# Patient Record
Sex: Male | Born: 1950 | Race: White | Hispanic: No | Marital: Single | Smoking: Never smoker
Health system: Southern US, Community
[De-identification: ages and names within clinical notes are randomized; demographics above are authoritative.]

## PROBLEM LIST (undated history)

## (undated) DIAGNOSIS — E119 Type 2 diabetes mellitus without complications: Secondary | ICD-10-CM

## (undated) DIAGNOSIS — J45909 Unspecified asthma, uncomplicated: Secondary | ICD-10-CM

## (undated) DIAGNOSIS — Z87442 Personal history of urinary calculi: Secondary | ICD-10-CM

## (undated) DIAGNOSIS — K219 Gastro-esophageal reflux disease without esophagitis: Secondary | ICD-10-CM

## (undated) DIAGNOSIS — K759 Inflammatory liver disease, unspecified: Secondary | ICD-10-CM

## (undated) DIAGNOSIS — M1990 Unspecified osteoarthritis, unspecified site: Secondary | ICD-10-CM

## (undated) DIAGNOSIS — M109 Gout, unspecified: Secondary | ICD-10-CM

## (undated) HISTORY — PX: OTHER SURGICAL HISTORY: SHX169

## (undated) HISTORY — PX: EXTRACORPOREAL SHOCK WAVE LITHOTRIPSY: SHX1557

## (undated) HISTORY — DX: Gout, unspecified: M10.9

## (undated) HISTORY — PX: TONSILLECTOMY: SUR1361

## (undated) HISTORY — PX: CYSTOSCOPY W/ URETERAL STENT PLACEMENT: SHX1429

## (undated) HISTORY — DX: Type 2 diabetes mellitus without complications: E11.9

## (undated) HISTORY — PX: PROSTATE BIOPSY: SHX241

## (undated) HISTORY — DX: Inflammatory liver disease, unspecified: K75.9

## (undated) HISTORY — DX: Unspecified osteoarthritis, unspecified site: M19.90

---

## 2020-09-04 ENCOUNTER — Other Ambulatory Visit: Payer: Self-pay

## 2020-09-04 ENCOUNTER — Encounter: Payer: Self-pay | Admitting: Urology

## 2020-09-04 ENCOUNTER — Ambulatory Visit (INDEPENDENT_AMBULATORY_CARE_PROVIDER_SITE_OTHER): Payer: Medicare Other | Admitting: Urology

## 2020-09-04 VITALS — BP 153/84 | HR 91 | Temp 98.0°F | Ht 75.0 in | Wt 253.0 lb

## 2020-09-04 DIAGNOSIS — Z87442 Personal history of urinary calculi: Secondary | ICD-10-CM | POA: Diagnosis not present

## 2020-09-04 DIAGNOSIS — N401 Enlarged prostate with lower urinary tract symptoms: Secondary | ICD-10-CM | POA: Diagnosis not present

## 2020-09-04 DIAGNOSIS — R350 Frequency of micturition: Secondary | ICD-10-CM

## 2020-09-04 DIAGNOSIS — R972 Elevated prostate specific antigen [PSA]: Secondary | ICD-10-CM

## 2020-09-04 DIAGNOSIS — R310 Gross hematuria: Secondary | ICD-10-CM

## 2020-09-04 DIAGNOSIS — N138 Other obstructive and reflux uropathy: Secondary | ICD-10-CM

## 2020-09-04 LAB — URINALYSIS, ROUTINE W REFLEX MICROSCOPIC
Bilirubin, UA: NEGATIVE
Glucose, UA: NEGATIVE
Ketones, UA: NEGATIVE
Nitrite, UA: NEGATIVE
Specific Gravity, UA: 1.02 (ref 1.005–1.030)
Urobilinogen, Ur: 1 mg/dL (ref 0.2–1.0)
pH, UA: 6.5 (ref 5.0–7.5)

## 2020-09-04 LAB — MICROSCOPIC EXAMINATION
Renal Epithel, UA: NONE SEEN /hpf
WBC, UA: >30 /hpf — AB (ref 0–5)

## 2020-09-04 NOTE — Progress Notes (Signed)
Urological Symptom Review  Patient is experiencing the following symptoms: Frequent urination Get up at night to urinate Stream starts and stops   Review of Systems  Gastrointestinal (upper)  : Negative for upper GI symptoms  Gastrointestinal (lower) : Negative for lower GI symptoms  Constitutional : Negative for symptoms  Skin: Negative for skin symptoms  Eyes: Negative for eye symptoms  Ear/Nose/Throat : Sinus problems  Hematologic/Lymphatic: Negative for Hematologic/Lymphatic symptoms  Cardiovascular : Negative for cardiovascular symptoms  Respiratory : Negative for respiratory symptoms  Endocrine: Negative for endocrine symptoms  Musculoskeletal: Negative for musculoskeletal symptoms  Neurological: Negative for neurological symptoms  Psychologic: Negative for psychiatric symptoms

## 2020-09-04 NOTE — Progress Notes (Signed)
Subjective: 1. Gross hematuria   2. BPH with urinary obstruction   3. Urinary frequency   4. History of nephrolithiasis   5. Elevated PSA      Bryan Vargas is a 70 yo make with a history of stones.  He last passed a stone about 3 months ago.  He currently has no symptoms but his UA today has >30 WBC's, 11-30 RBC's and mod bacteria.  He has had some gross hematuria intermittently over the last 2 years.  He was treated with antibiotics when he last bled 3 months ago and the bleeding clear.  He stays hydrated.  His IPSS is 5 with nocturia x .   He has had ESWL x 2 and ureteroscopy x 1.  He had an 8x30mm stone and was stented in 2015.   His prior care has been in [place].  He has no dysuria or flank pain at this time.    He is currently on tamsulosin and has been on it for a year.   He has had prior prostate biopsies with the last I find in 2008 for a PSA of 4.69.  His prostate was 65.74ml and the biopsy was negative and has had cystoscopy.  ROS:  ROS  Not on File  Past Medical History:  Diagnosis Date  . Arthritis   . Diabetes ([place])   . Gout   . Hepatitis     Past Surgical History:  Procedure Laterality Date  . arthritis    . CYSTOSCOPY W/ URETERAL STENT PLACEMENT    . EXTRACORPOREAL SHOCK WAVE LITHOTRIPSY    . EXTRACORPOREAL SHOCK WAVE LITHOTRIPSY    . PROSTATE BIOPSY    . TONSILLECTOMY      Social History   Socioeconomic History  . Marital status: Single    Spouse name: Not on file  . Number of children: 2  . Years of education: Not on file  . Highest education level: Not on file  Occupational History  . Occupation: retired  Tobacco Use  . Smoking status: Never Smoker  . Smokeless tobacco: Never Used  Substance and Sexual Activity  . Alcohol use: Yes  . Drug use: Not on file  . Sexual activity: Not on file  Other Topics Concern  . Not on file  Social History Narrative  . Not on file   Social Determinants of Health   Financial Resource Strain: Not on file  Food  Insecurity: Not on file  Transportation Needs: Not on file  Physical Activity: Not on file  Stress: Not on file  Social Connections: Not on file  Intimate Partner Violence: Not on file    History reviewed. No pertinent family history.  Anti-infectives: Anti-infectives (From admission, onward)   None      Current Outpatient Medications  Medication Sig Dispense Refill  . azelastine (ASTELIN) 0.1 % nasal spray Place into both nostrils.    . brimonidine (ALPHAGAN) 0.2 % ophthalmic solution SMARTSIG:In Eye(s)    . JANUVIA 100 MG tablet Take 100 mg by mouth daily.    Marland Kitchen latanoprost (XALATAN) 0.005 % ophthalmic solution SMARTSIG:In Eye(s)    . tamsulosin (FLOMAX) 0.4 MG CAPS capsule Take 0.4 mg by mouth daily.     No current facility-administered medications for this visit.     Objective: Vital signs in last 24 hours: BP (!) 153/84   Pulse 91   Temp 98 F (36.7 C)   Ht 6\' 3"  (1.905 m)   Wt 253 lb (114.8 kg)   BMI 31.62  kg/m   Intake/Output from previous day: No intake/output data recorded. Intake/Output this shift: @IOTHISSHIFT @   Physical Exam Vitals reviewed.  Constitutional:      Appearance: Normal appearance. He is obese.  HENT:     Head: Normocephalic and atraumatic.  Cardiovascular:     Rate and Rhythm: Normal rate and regular rhythm.     Heart sounds: Normal heart sounds.  Pulmonary:     Effort: Pulmonary effort is normal.     Breath sounds: Normal breath sounds.  Abdominal:     Palpations: Abdomen is soft. There is no mass.     Tenderness: There is no right CVA tenderness or left CVA tenderness.  Genitourinary:    Comments: Nl penis with adequate meatus. Nl scrotum, testes and epididymis. AP without lesions. NST without mass. Prostate 3+ benign. SV non-palpable.  Musculoskeletal:        General: No swelling or tenderness. Normal range of motion.     Cervical back: Normal range of motion and neck supple.  Skin:    General: Skin is warm and dry.   Neurological:     General: No focal deficit present.     Mental Status: He is alert and oriented to person, place, and time.  Psychiatric:        Mood and Affect: Mood normal.        Behavior: Behavior normal.     Lab Results:  No results found for this or any previous visit (from the past 24 hour(s)).  BMET No results for input(s): NA, K, CL, CO2, GLUCOSE, BUN, CREATININE, CALCIUM in the last 72 hours. PT/INR No results for input(s): LABPROT, INR in the last 72 hours. ABG No results for input(s): PHART, HCO3 in the last 72 hours.  Invalid input(s): PCO2, PO2  Studies/Results: No results found. UA reviewed.   Assessment/Plan: Gross hematuria:  He has had intermittent gross hematuria with a history of stones.  His UA looks infected today as well.   I will get a culture and set him up for a CT hematuria study with return for possible cystoscopy and I will request records from [facility].  Elevated PSA.   He has a long history of an elevated PSA.  His prostate is large but benign.   I will see what has been done recently with [facility] and recheck the PSA if needed.   No orders of the defined types were placed in this encounter.    Orders Placed This Encounter  Procedures  . Urine Culture  . CT HEMATURIA WORKUP    Standing Status:   Future    Standing Expiration Date:   09/04/2021    Order Specific Question:   Reason for Exam (SYMPTOM  OR DIAGNOSIS REQUIRED)    Answer:   gross hematuria    Order Specific Question:   Preferred imaging location?    Answer:   [facility]    Order Specific Question:   Radiology Contrast Protocol - do NOT remove file path    Answer:   [email or web address]  . Urinalysis, Routine w reflex microscopic     Return for Next available with CT results for -possible cystoscopy. .    CC: Dr. Jamesetta Orleans 09/04/2020 [phone]

## 2020-09-06 LAB — URINE CULTURE: Organism ID, Bacteria: NO GROWTH

## 2020-09-08 ENCOUNTER — Telehealth: Payer: Self-pay

## 2020-09-08 NOTE — Telephone Encounter (Signed)
-----   Message from Irine Seal, MD sent at 09/08/2020  9:54 AM EST ----- Culture is negative.  He needs to f/u as planned with the CT for possible cystoscopy.

## 2020-09-08 NOTE — Telephone Encounter (Signed)
Patient notified of results. Will keep f/u

## 2020-09-23 DIAGNOSIS — I2510 Atherosclerotic heart disease of native coronary artery without angina pectoris: Secondary | ICD-10-CM

## 2020-09-23 HISTORY — DX: Atherosclerotic heart disease of native coronary artery without angina pectoris: I25.10

## 2020-09-30 ENCOUNTER — Ambulatory Visit (HOSPITAL_COMMUNITY)
Admission: RE | Admit: 2020-09-30 | Discharge: 2020-09-30 | Disposition: A | Discharge status: Home / Self Care | Payer: Medicare Other | Source: Ambulatory Visit | Attending: Urology | Admitting: Urology

## 2020-09-30 ENCOUNTER — Other Ambulatory Visit: Payer: Self-pay

## 2020-09-30 DIAGNOSIS — R310 Gross hematuria: Secondary | ICD-10-CM | POA: Insufficient documentation

## 2020-09-30 DIAGNOSIS — Z87442 Personal history of urinary calculi: Secondary | ICD-10-CM | POA: Diagnosis not present

## 2020-09-30 MED ORDER — IOHEXOL 300 MG/ML  SOLN
150.0000 mL | Freq: Once | INTRAMUSCULAR | Status: AC | PRN
  Administered 2020-09-30: 125 mL via INTRAVENOUS

## 2020-10-02 NOTE — Progress Notes (Signed)
He has a severely atrophied right kidney which was present on a prior scan in 2018 in [place].  He has minimal stones.  He has a few renal cysts.  The prostate is very large which again was present previously.   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  There was some evidence of this on his prior imaging in [place] but I don't have those results.  There are small nodes in the pelvis and colonosocpy was suggested.   Please forward this report to his PCP so he can follow the pancreatic cyst and the pelvic nodes and arrange colonoscopy if indicated.  He should f/u with me as planned.

## 2020-10-03 LAB — POCT I-STAT CREATININE: Creatinine, Ser: 1 mg/dL (ref 0.61–1.24)

## 2020-10-06 ENCOUNTER — Telehealth: Payer: Self-pay

## 2020-10-06 NOTE — Telephone Encounter (Signed)
-----   Message from Irine Seal, MD sent at 10/02/2020 12:34 PM EST ----- He has a severely atrophied right kidney which was present on a prior scan in 2018 in [place].  He has minimal stones.  He has a few renal cysts.  The prostate is very large which again was present previously.   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  There was some evidence of this on his prior imaging in [place] but I don't have those results.  There are small nodes in the pelvis and colonosocpy was suggested.   Please forward this report to his PCP so he can follow the pancreatic cyst and the pelvic nodes and arrange colonoscopy if indicated.  He should f/u with me as planned.

## 2020-10-06 NOTE — Telephone Encounter (Signed)
Left message back for pt to return call.

## 2020-10-07 NOTE — Telephone Encounter (Signed)
-----   Message from Irine Seal, MD sent at 10/02/2020 12:34 PM EST ----- He has a severely atrophied right kidney which was present on a prior scan in 2018 in [place].  He has minimal stones.  He has a few renal cysts.  The prostate is very large which again was present previously.   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  There was some evidence of this on his prior imaging in [place] but I don't have those results.  There are small nodes in the pelvis and colonosocpy was suggested.   Please forward this report to his PCP so he can follow the pancreatic cyst and the pelvic nodes and arrange colonoscopy if indicated.  He should f/u with me as planned.

## 2020-10-07 NOTE — Telephone Encounter (Signed)
Pt notified of results and report with note from Dr. Jeffie Pollock sent to Dr. Darlina Sicilian, pcp.

## 2020-10-15 NOTE — Progress Notes (Unsigned)
Subjective: 1. BPH with urinary obstruction   2. Urinary frequency   3. Bladder stones   4. Bacteriuria   5. Renal atrophy, right   6. Renal cyst     10/16/20: Bryan Vargas returns today in f/u with CT results.  He has right renal atrophy with punctate stones.  He has left renal cysts.  His prostate is very large with intravesical protrusion and tiny bladder stones but no obvious mass. His prostate measures about 371ml.   He is voiding well with a reduced stream.  His IPSS is 5.  His urine culture was negative on 09/04/20.  His UA has >30 WBC and many bacteria but no only 0-2 RBC's.  He remains on tamsulosin but hasn't notice improvement with that.   Bryan Vargas is a 70 yo make with a history of stones.  He last passed a stone about 3 months ago.  He currently has no symptoms but his UA today has >30 WBC's, 11-30 RBC's and mod bacteria.  He has had some gross hematuria intermittently over the last 2 years.  He was treated with antibiotics when he last bled 3 months ago and the bleeding clear.  He stays hydrated.  His IPSS is 5 with nocturia x .   He has had ESWL x 2 and ureteroscopy x 1.  He had an 8x66mm stone and was stented in 2015.   His prior care has been in [place].  He has no dysuria or flank pain at this time.    He is currently on tamsulosin and has been on it for a year.   He has had prior prostate biopsies with the last I find in 2008 for a PSA of 4.69.  His prostate was 65.38ml and the biopsy was negative and has had cystoscopy.  ROS:  ROS  Not on File  Past Medical History:  Diagnosis Date  . Arthritis   . Diabetes ([place])   . Gout   . Hepatitis     Past Surgical History:  Procedure Laterality Date  . arthritis    . CYSTOSCOPY W/ URETERAL STENT PLACEMENT    . EXTRACORPOREAL SHOCK WAVE LITHOTRIPSY    . EXTRACORPOREAL SHOCK WAVE LITHOTRIPSY    . PROSTATE BIOPSY    . TONSILLECTOMY      Social History   Socioeconomic History  . Marital status: Single    Spouse name: Not on file  .  Number of children: 2  . Years of education: Not on file  . Highest education level: Not on file  Occupational History  . Occupation: retired  Tobacco Use  . Smoking status: Never Smoker  . Smokeless tobacco: Never Used  Substance and Sexual Activity  . Alcohol use: Yes  . Drug use: Not on file  . Sexual activity: Not on file  Other Topics Concern  . Not on file  Social History Narrative  . Not on file   Social Determinants of Health   Financial Resource Strain: Not on file  Food Insecurity: Not on file  Transportation Needs: Not on file  Physical Activity: Not on file  Stress: Not on file  Social Connections: Not on file  Intimate Partner Violence: Not on file    History reviewed. No pertinent family history.  Anti-infectives: Anti-infectives (From admission, onward)   None      Current Outpatient Medications  Medication Sig Dispense Refill  . azelastine (ASTELIN) 0.1 % nasal spray Place into both nostrils.    . brimonidine (ALPHAGAN) 0.2 % ophthalmic  solution SMARTSIG:In Eye(s)    . finasteride (PROSCAR) 5 MG tablet Take 1 tablet (5 mg total) by mouth daily. 90 tablet 3  . JANUVIA 100 MG tablet Take 100 mg by mouth daily.    Marland Kitchen latanoprost (XALATAN) 0.005 % ophthalmic solution SMARTSIG:In Eye(s)    . tamsulosin (FLOMAX) 0.4 MG CAPS capsule Take 0.4 mg by mouth daily.     No current facility-administered medications for this visit.     Objective: Vital signs in last 24 hours: BP (!) 150/77   Pulse 85   Temp 98.4 F (36.9 C)   Ht 6\' 3"  (1.905 m)   Wt 258 lb (117 kg)   BMI 32.25 kg/m   Intake/Output from previous day: No intake/output data recorded. Intake/Output this shift: @IOTHISSHIFT @   Physical Exam  Lab Results:  Recent Results (from the past 2160 hour(s))  Urinalysis, Routine w reflex microscopic     Status: Abnormal   Collection Time: 09/04/20 12:04 PM  Result Value Ref Range   Specific Gravity, UA 1.020 1.005 - 1.030   pH, UA 6.5 5.0 -  7.5   Color, UA Amber (A) Yellow   Appearance Ur Hazy (A) Clear   Leukocytes,UA 2+ (A) Negative   Protein,UA 2+ (A) Negative/Trace   Glucose, UA Negative Negative   Ketones, UA Negative Negative   RBC, UA 1+ (A) Negative   Bilirubin, UA Negative Negative   Urobilinogen, Ur 1.0 0.2 - 1.0 mg/dL   Nitrite, UA Negative Negative   Microscopic Examination See below:   Microscopic Examination     Status: Abnormal   Collection Time: 09/04/20 12:04 PM   Urine  Result Value Ref Range   WBC, UA >30 (A) 0 - 5 /hpf   RBC 11-30 (A) 0 - 2 /hpf   Epithelial Cells (non renal) 0-10 0 - 10 /hpf   Renal Epithel, UA None seen None seen /hpf   Mucus, UA Present Not Estab.   Bacteria, UA Moderate (A) None seen/Few  Urine Culture     Status: None   Collection Time: 09/04/20  3:31 PM   Specimen: Urine, Clean Catch   Urine  Result Value Ref Range   Urine Culture, Routine Final report    Organism ID, Bacteria No growth   I-STAT creatinine     Status: None   Collection Time: 09/30/20  3:26 PM  Result Value Ref Range   Creatinine, Ser 1.00 0.61 - 1.24 mg/dL  Urinalysis, Routine w reflex microscopic     Status: Abnormal   Collection Time: 10/16/20  2:13 PM  Result Value Ref Range   Specific Gravity, UA 1.020 1.005 - 1.030   pH, UA 6.5 5.0 - 7.5   Color, UA Yellow Yellow   Appearance Ur Clear Clear   Leukocytes,UA 2+ (A) Negative   Protein,UA 2+ (A) Negative/Trace   Glucose, UA Negative Negative   Ketones, UA Negative Negative   RBC, UA Trace (A) Negative   Bilirubin, UA Negative Negative   Urobilinogen, Ur 1.0 0.2 - 1.0 mg/dL   Nitrite, UA Negative Negative   Microscopic Examination See below:   Microscopic Examination     Status: Abnormal   Collection Time: 10/16/20  2:13 PM   Urine  Result Value Ref Range   WBC, UA >30 (A) 0 - 5 /hpf   RBC 0-2 0 - 2 /hpf   Epithelial Cells (non renal) 0-10 0 - 10 /hpf   Renal Epithel, UA None seen None seen /hpf   Mucus,  UA Present Not Estab.    Bacteria, UA Many (A) None seen/Few  PSA, total and free     Status: Abnormal   Collection Time: 10/16/20  2:26 PM  Result Value Ref Range   Prostate Specific Ag, Serum 9.9 (H) 0.0 - 4.0 ng/mL    Comment: Roche ECLIA methodology. According to the American Urological Association, Serum PSA should decrease and remain at undetectable levels after radical prostatectomy. The AUA defines biochemical recurrence as an initial PSA value 0.2 ng/mL or greater followed by a subsequent confirmatory PSA value 0.2 ng/mL or greater. Values obtained with different assay methods or kits cannot be used interchangeably. Results cannot be interpreted as absolute evidence of the presence or absence of malignant disease.    PSA, Free 3.61 N/A ng/mL    Comment: Roche ECLIA methodology.   PSA, Free Pct 36.5 %    Comment: The table below lists the probability of prostate cancer for men with non-suspicious DRE results and total PSA between 4 and 10 ng/mL, by patient age Ricci Barker, [place], 628:[id number]).                   % Free PSA       50-64 yr        65-75 yr                   0.00-10.00%        56%             55%                  10.01-15.00%        24%             35%                  15.01-20.00%        17%             23%                  20.01-25.00%        10%             20%                       >25.00%         5%              9% Please note:  Catalona et al did not make specific               recommendations regarding the use of               percent free PSA for any other population               of men.      Studies/Results: CLINICAL DATA:  Gross hematuria, history of renal stones.  EXAM: CT ABDOMEN AND PELVIS WITHOUT AND WITH CONTRAST  TECHNIQUE: Multidetector CT imaging of the abdomen and pelvis was performed following the standard protocol before and following the bolus administration of intravenous contrast.  CONTRAST:  158mL OMNIPAQUE IOHEXOL 300 MG/ML   SOLN  COMPARISON:  None.  FINDINGS: Lower chest: Image quality is degraded by respiratory motion. Lung bases are grossly clear. Coronary artery calcification. Heart size normal. No pericardial or pleural effusion. Distal esophagus is grossly unremarkable.  Hepatobiliary: Liver is decreased in attenuation diffusely and is enlarged, measuring  20.0 cm. Liver and gallbladder are otherwise unremarkable. No biliary ductal dilatation.  Pancreas: 10 x 14 mm low-attenuation lesion in the tail the pancreas (7/30). Otherwise unremarkable.  Spleen: Negative.  Adrenals/Urinary Tract: Adrenal glands are unremarkable. Right kidney is severely atrophic with dilatation of the right ureter. There may be punctate stones in the right kidney. Multiple low-attenuation lesions in the left kidney measure up to 6.2 cm. Those measuring greater than 1.5 cm in size are consistent with cysts. Others are too small to definitively characterize but statistically, cysts are likely. Subcentimeter hyperdense lesion in the anterior interpolar left kidney (2/40), too small to characterize. No left renal or ureteric stones. Left ureter is decompressed. Poor excretion of contrast in the right kidney, limiting evaluation. Distal left ureter is poorly opacified, also limiting evaluation. Otherwise, no filling defects in the opacified portions of the left intrarenal collecting system and left ureter. There are peripheral calcifications in the posterolateral aspects of bladder bilaterally. Bladder is indented by a markedly enlarged prostate.  Stomach/Bowel: Stomach, small bowel, appendix and colon are unremarkable.  Vascular/Lymphatic: Atherosclerotic calcification of the aorta. There are several perirectal lymph nodes measuring up to 6 mm (7/81).  Reproductive: Prostate is markedly enlarged and indents the bladder.  Other: No free fluid. Mesenteries and peritoneum are  otherwise unremarkable.  Musculoskeletal: There are scattered sclerotic lesions in the spine and pelvis, nonspecific. Degenerative changes in the spine.  IMPRESSION: 1. Markedly enlarged prostate indents the bladder. Peripheral bladder calcifications. Punctate stones in a severely atrophic right kidney. Right hydronephrosis without definitive cause identified. Distal left ureter is poorly opacified, limiting evaluation. No additional findings to explain the patient's hematuria. 2. Numerous subcentimeter perirectal lymph nodes. Consider correlation with colonoscopy, as clinically indicated. 3. Scattered sclerotic foci in the spine and pelvis are nonspecific. Difficult to exclude metastatic disease. 4. Steatotic enlarged liver. 5. 10 x 14 mm low-attenuation pancreatic tail lesion may represent a pseudocyst if there is a history of pancreatitis. Cystic pancreatic neoplasm cannot be excluded. Follow-up CT abdomen without and with contrast in 2 years could be performed in further evaluation, as clinically indicated. This recommendation follows ACR consensus guidelines: Management of Incidental Pancreatic Cysts: A White Paper of the ACR Incidental Findings Committee. J Am Coll Radiol 6195;09:[id number]. 6. Aortic atherosclerosis (ICD10-I70.0). Coronary artery calcification.   Electronically Signed   By: Lorin Picket M.D.   On: 10/01/2020 10:31  Assessment/Plan: Gross hematuria: The hematuria is likely related to the bladder stones and large prostate.   I am going to put him on finasteride in addition to the tamsulosin.   I don't think he needs cystoscopy at this point.   Elevated PSA.   He has a long history of an elevated PSA.  His prostate is very large (362ml).  I will get a PSA today and prior to f/u in 3 months.   Renal atrophy on the right with small stones.  Renal cyst on the left.    Meds ordered this encounter  Medications  . finasteride (PROSCAR) 5 MG tablet     Sig: Take 1 tablet (5 mg total) by mouth daily.    Dispense:  90 tablet    Refill:  3     Orders Placed This Encounter  Procedures  . Urine Culture  . Microscopic Examination  . Urinalysis, Routine w reflex microscopic  . PSA, total and free    Standing Status:   Future    Standing Expiration Date:   02/13/2021  . PSA, total and  free     Return in about 3 months (around 01/13/2021).    CC: Dr. Darlina Sicilian.     Irine Seal 10/17/2020 [phone]

## 2020-10-16 ENCOUNTER — Other Ambulatory Visit: Payer: Self-pay

## 2020-10-16 ENCOUNTER — Ambulatory Visit (INDEPENDENT_AMBULATORY_CARE_PROVIDER_SITE_OTHER): Payer: Medicare Other | Admitting: Urology

## 2020-10-16 ENCOUNTER — Encounter: Payer: Self-pay | Admitting: Urology

## 2020-10-16 VITALS — BP 150/77 | HR 85 | Temp 98.4°F | Ht 75.0 in | Wt 258.0 lb

## 2020-10-16 DIAGNOSIS — R8271 Bacteriuria: Secondary | ICD-10-CM | POA: Diagnosis not present

## 2020-10-16 DIAGNOSIS — R350 Frequency of micturition: Secondary | ICD-10-CM

## 2020-10-16 DIAGNOSIS — N401 Enlarged prostate with lower urinary tract symptoms: Secondary | ICD-10-CM | POA: Diagnosis not present

## 2020-10-16 DIAGNOSIS — N210 Calculus in bladder: Secondary | ICD-10-CM

## 2020-10-16 DIAGNOSIS — N281 Cyst of kidney, acquired: Secondary | ICD-10-CM

## 2020-10-16 DIAGNOSIS — N138 Other obstructive and reflux uropathy: Secondary | ICD-10-CM

## 2020-10-16 DIAGNOSIS — N261 Atrophy of kidney (terminal): Secondary | ICD-10-CM

## 2020-10-16 LAB — URINALYSIS, ROUTINE W REFLEX MICROSCOPIC
Bilirubin, UA: NEGATIVE
Glucose, UA: NEGATIVE
Ketones, UA: NEGATIVE
Nitrite, UA: NEGATIVE
Specific Gravity, UA: 1.02 (ref 1.005–1.030)
Urobilinogen, Ur: 1 mg/dL (ref 0.2–1.0)
pH, UA: 6.5 (ref 5.0–7.5)

## 2020-10-16 LAB — MICROSCOPIC EXAMINATION
Renal Epithel, UA: NONE SEEN /hpf
WBC, UA: >30 /hpf — AB (ref 0–5)

## 2020-10-16 MED ORDER — FINASTERIDE 5 MG PO TABS: 5.0000 mg | ORAL_TABLET | Freq: Every day | ORAL | 3 refills | Status: DC

## 2020-10-16 NOTE — Progress Notes (Signed)
Urological Symptom Review  Patient is experiencing the following symptoms: Get up at night to urinate Blood in urine Urinary tract infection   Review of Systems  Gastrointestinal (upper)  : Negative for upper GI symptoms  Gastrointestinal (lower) : Negative for lower GI symptoms  Constitutional : Negative for symptoms  Skin: Negative for skin symptoms  Eyes: Negative for eye symptoms  Ear/Nose/Throat : Sinus problems  Hematologic/Lymphatic: Negative for Hematologic/Lymphatic symptoms  Cardiovascular : Negative for cardiovascular symptoms  Respiratory : Negative for respiratory symptoms  Endocrine: Negative for endocrine symptoms  Musculoskeletal: Negative for musculoskeletal symptoms  Neurological: Negative for neurological symptoms  Psychologic: Negative for psychiatric symptoms

## 2020-10-17 LAB — PSA, TOTAL AND FREE
PSA, Free Pct: 36.5 %
PSA, Free: 3.61 ng/mL
Prostate Specific Ag, Serum: 9.9 ng/mL — ABNORMAL HIGH (ref 0.0–4.0)

## 2020-12-12 ENCOUNTER — Encounter: Payer: Medicare Other | Admitting: Gastroenterology

## 2020-12-30 ENCOUNTER — Encounter: Payer: Self-pay | Admitting: Gastroenterology

## 2021-01-07 ENCOUNTER — Other Ambulatory Visit: Payer: Medicare Other

## 2021-01-07 ENCOUNTER — Other Ambulatory Visit: Payer: Self-pay

## 2021-01-07 DIAGNOSIS — N138 Other obstructive and reflux uropathy: Secondary | ICD-10-CM

## 2021-01-07 DIAGNOSIS — N401 Enlarged prostate with lower urinary tract symptoms: Secondary | ICD-10-CM

## 2021-01-08 LAB — PSA, TOTAL AND FREE
PSA, Free Pct: 34.8 %
PSA, Free: 2.4 ng/mL
Prostate Specific Ag, Serum: 6.9 ng/mL — ABNORMAL HIGH (ref 0.0–4.0)

## 2021-01-15 ENCOUNTER — Other Ambulatory Visit: Payer: Self-pay

## 2021-01-15 ENCOUNTER — Encounter: Payer: Self-pay | Admitting: Urology

## 2021-01-15 ENCOUNTER — Ambulatory Visit: Payer: Medicare Other | Admitting: Urology

## 2021-01-15 VITALS — Ht 75.0 in

## 2021-01-15 DIAGNOSIS — R350 Frequency of micturition: Secondary | ICD-10-CM

## 2021-01-15 DIAGNOSIS — R972 Elevated prostate specific antigen [PSA]: Secondary | ICD-10-CM

## 2021-01-15 DIAGNOSIS — N210 Calculus in bladder: Secondary | ICD-10-CM

## 2021-01-15 DIAGNOSIS — R3129 Other microscopic hematuria: Secondary | ICD-10-CM

## 2021-01-15 DIAGNOSIS — N401 Enlarged prostate with lower urinary tract symptoms: Secondary | ICD-10-CM

## 2021-01-15 DIAGNOSIS — N138 Other obstructive and reflux uropathy: Secondary | ICD-10-CM

## 2021-01-15 LAB — MICROSCOPIC EXAMINATION: WBC, UA: >30 /hpf — AB (ref 0–5)

## 2021-01-15 LAB — URINALYSIS, ROUTINE W REFLEX MICROSCOPIC
Bilirubin, UA: NEGATIVE
Glucose, UA: NEGATIVE
Ketones, UA: NEGATIVE
Nitrite, UA: NEGATIVE
Specific Gravity, UA: 1.02 (ref 1.005–1.030)
Urobilinogen, Ur: 1 mg/dL (ref 0.2–1.0)
pH, UA: 6.5 (ref 5.0–7.5)

## 2021-01-15 NOTE — Progress Notes (Signed)

## 2021-01-15 NOTE — Progress Notes (Signed)
Subjective: 1. BPH with urinary obstruction   2. Urinary frequency   3. Elevated PSA   4. Bladder stones   5. Microhematuria    01/15/21: Bryan Vargas returns today in f/u.   He has BPH with a 376ml prostate and small renal stones.  He remains on tamsulosin and was given finasteride at his last visit. He has had no side effects with the med.  He has had no hematuria.  He has a reduced stream in the AM with some intermittency.   His PSA fell from 9.9 with a 36.5% f/t ratio to 6.9 with a 34.8% f/t ratio over the last 3 months on the finasteride.  His IPSS is 4.   His UA has >30 WBC and 3-10 RBC with few bacteria.  This is stable.    10/16/20: Bryan Vargas returns today in f/u with CT results.  He has right renal atrophy with punctate stones.  He has left renal cysts.  His prostate is very large with intravesical protrusion and tiny bladder stones but no obvious mass. His prostate measures about 39ml.   He is voiding well with a reduced stream.  His IPSS is 5.  His urine culture was negative on 09/04/20.  His UA has >30 WBC and many bacteria but no only 0-2 RBC's.  He remains on tamsulosin but hasn't notice improvement with that.   Bryan Vargas is a 70 yo make with a history of stones.  He last passed a stone about 3 months ago.  He currently has no symptoms but his UA today has >30 WBC's, 11-30 RBC's and mod bacteria.  He has had some gross hematuria intermittently over the last 2 years.  He was treated with antibiotics when he last bled 3 months ago and the bleeding clear.  He stays hydrated.  His IPSS is 5 with nocturia x .   He has had ESWL x 2 and ureteroscopy x 1.  He had an 8x41mm stone and was stented in 2015.   His prior care has been in [place].  He has no dysuria or flank pain at this time.    He is currently on tamsulosin and has been on it for a year.   He has had prior prostate biopsies with the last I find in 2008 for a PSA of 4.69.  His prostate was 65.2ml and the biopsy was negative and has had cystoscopy.   ROS:  Review of Systems  Gastrointestinal:       He has been having some bloating since he had some cake and some orange drink several days ago.      No Known Allergies  Past Medical History:  Diagnosis Date  . Arthritis   . Diabetes ([place])   . Gout   . Hepatitis     Past Surgical History:  Procedure Laterality Date  . arthritis    . CYSTOSCOPY W/ URETERAL STENT PLACEMENT    . EXTRACORPOREAL SHOCK WAVE LITHOTRIPSY    . EXTRACORPOREAL SHOCK WAVE LITHOTRIPSY    . PROSTATE BIOPSY    . TONSILLECTOMY      Social History   Socioeconomic History  . Marital status: Single    Spouse name: Not on file  . Number of children: 2  . Years of education: Not on file  . Highest education level: Not on file  Occupational History  . Occupation: retired  Tobacco Use  . Smoking status: Never Smoker  . Smokeless tobacco: Never Used  Substance and Sexual Activity  . Alcohol  use: Yes  . Drug use: Not on file  . Sexual activity: Not on file  Other Topics Concern  . Not on file  Social History Narrative  . Not on file   Social Determinants of Health   Financial Resource Strain: Not on file  Food Insecurity: Not on file  Transportation Needs: Not on file  Physical Activity: Not on file  Stress: Not on file  Social Connections: Not on file  Intimate Partner Violence: Not on file    History reviewed. No pertinent family history.  Anti-infectives: Anti-infectives (From admission, onward)   None      Current Outpatient Medications  Medication Sig Dispense Refill  . azelastine (ASTELIN) 0.1 % nasal spray Place into both nostrils.    . brimonidine (ALPHAGAN) 0.2 % ophthalmic solution SMARTSIG:In Eye(s)    . finasteride (PROSCAR) 5 MG tablet Take 1 tablet (5 mg total) by mouth daily. 90 tablet 3  . JANUVIA 100 MG tablet Take 100 mg by mouth daily.    Marland Kitchen latanoprost (XALATAN) 0.005 % ophthalmic solution SMARTSIG:In Eye(s)    . LEVEMIR FLEXTOUCH 100 UNIT/ML FlexPen SMARTSIG:20  Unit(s) SUB-Q Daily    . tamsulosin (FLOMAX) 0.4 MG CAPS capsule Take 0.4 mg by mouth daily.     No current facility-administered medications for this visit.     Objective: Vital signs in last 24 hours: Ht 6\' 3"  (1.905 m)   BMI 32.25 kg/m   Intake/Output from previous day: No intake/output data recorded. Intake/Output this shift: @IOTHISSHIFT @   Physical Exam  Lab Results:  Recent Results (from the past 2160 hour(s))  PSA, total and free     Status: Abnormal   Collection Time: 01/07/21  3:35 PM  Result Value Ref Range   Prostate Specific Ag, Serum 6.9 (H) 0.0 - 4.0 ng/mL    Comment: Roche ECLIA methodology. According to the American Urological Association, Serum PSA should decrease and remain at undetectable levels after radical prostatectomy. The AUA defines biochemical recurrence as an initial PSA value 0.2 ng/mL or greater followed by a subsequent confirmatory PSA value 0.2 ng/mL or greater. Values obtained with different assay methods or kits cannot be used interchangeably. Results cannot be interpreted as absolute evidence of the presence or absence of malignant disease.    PSA, Free 2.40 N/A ng/mL    Comment: Roche ECLIA methodology.   PSA, Free Pct 34.8 %    Comment: The table below lists the probability of prostate cancer for men with non-suspicious DRE results and total PSA between 4 and 10 ng/mL, by patient age Ricci Barker, [place], 175:1025).                   % Free PSA       50-64 yr        65-75 yr                   0.00-10.00%        56%             55%                  10.01-15.00%        24%             35%                  15.01-20.00%        17%  23%                  20.01-25.00%        10%             20%                       >25.00%         5%              9% Please note:  Catalona et al did not make specific               recommendations regarding the use of               percent free PSA for any other population                of men.   Urinalysis, Routine w reflex microscopic     Status: Abnormal   Collection Time: 01/15/21  3:34 PM  Result Value Ref Range   Specific Gravity, UA 1.020 1.005 - 1.030   pH, UA 6.5 5.0 - 7.5   Color, UA Yellow Yellow   Appearance Ur Clear Clear   Leukocytes,UA 2+ (A) Negative   Protein,UA 2+ (A) Negative/Trace   Glucose, UA Negative Negative   Ketones, UA Negative Negative   RBC, UA Trace (A) Negative   Bilirubin, UA Negative Negative   Urobilinogen, Ur 1.0 0.2 - 1.0 mg/dL   Nitrite, UA Negative Negative   Microscopic Examination See below:   Microscopic Examination     Status: Abnormal   Collection Time: 01/15/21  3:34 PM   Urine  Result Value Ref Range   WBC, UA >30 (A) 0 - 5 /hpf   RBC 3-10 (A) 0 - 2 /hpf   Epithelial Cells (non renal) 0-10 0 - 10 /hpf   Renal Epithel, UA 0-10 (A) None seen /hpf   Mucus, UA Present Not Estab.   Bacteria, UA Few (A) None seen/Few     Studies/Results: CLINICAL DATA:  Gross hematuria, history of renal stones.  EXAM: CT ABDOMEN AND PELVIS WITHOUT AND WITH CONTRAST  TECHNIQUE: Multidetector CT imaging of the abdomen and pelvis was performed following the standard protocol before and following the bolus administration of intravenous contrast.  CONTRAST:  157mL OMNIPAQUE IOHEXOL 300 MG/ML  SOLN  COMPARISON:  None.  FINDINGS: Lower chest: Image quality is degraded by respiratory motion. Lung bases are grossly clear. Coronary artery calcification. Heart size normal. No pericardial or pleural effusion. Distal esophagus is grossly unremarkable.  Hepatobiliary: Liver is decreased in attenuation diffusely and is enlarged, measuring 20.0 cm. Liver and gallbladder are otherwise unremarkable. No biliary ductal dilatation.  Pancreas: 10 x 14 mm low-attenuation lesion in the tail the pancreas (7/30). Otherwise unremarkable.  Spleen: Negative.  Adrenals/Urinary Tract: Adrenal glands are unremarkable. Right kidney is  severely atrophic with dilatation of the right ureter. There may be punctate stones in the right kidney. Multiple low-attenuation lesions in the left kidney measure up to 6.2 cm. Those measuring greater than 1.5 cm in size are consistent with cysts. Others are too small to definitively characterize but statistically, cysts are likely. Subcentimeter hyperdense lesion in the anterior interpolar left kidney (2/40), too small to characterize. No left renal or ureteric stones. Left ureter is decompressed. Poor excretion of contrast in the right kidney, limiting evaluation. Distal left ureter is poorly opacified, also limiting evaluation. Otherwise, no filling defects in the opacified portions  of the left intrarenal collecting system and left ureter. There are peripheral calcifications in the posterolateral aspects of bladder bilaterally. Bladder is indented by a markedly enlarged prostate.  Stomach/Bowel: Stomach, small bowel, appendix and colon are unremarkable.  Vascular/Lymphatic: Atherosclerotic calcification of the aorta. There are several perirectal lymph nodes measuring up to 6 mm (7/81).  Reproductive: Prostate is markedly enlarged and indents the bladder.  Other: No free fluid. Mesenteries and peritoneum are otherwise unremarkable.  Musculoskeletal: There are scattered sclerotic lesions in the spine and pelvis, nonspecific. Degenerative changes in the spine.  IMPRESSION: 1. Markedly enlarged prostate indents the bladder. Peripheral bladder calcifications. Punctate stones in a severely atrophic right kidney. Right hydronephrosis without definitive cause identified. Distal left ureter is poorly opacified, limiting evaluation. No additional findings to explain the patient's hematuria. 2. Numerous subcentimeter perirectal lymph nodes. Consider correlation with colonoscopy, as clinically indicated. 3. Scattered sclerotic foci in the spine and pelvis are  nonspecific. Difficult to exclude metastatic disease. 4. Steatotic enlarged liver. 5. 10 x 14 mm low-attenuation pancreatic tail lesion may represent a pseudocyst if there is a history of pancreatitis. Cystic pancreatic neoplasm cannot be excluded. Follow-up CT abdomen without and with contrast in 2 years could be performed in further evaluation, as clinically indicated. This recommendation follows ACR consensus guidelines: Management of Incidental Pancreatic Cysts: A White Paper of the ACR Incidental Findings Committee. J Am Coll Radiol 0630;16:010-932. 6. Aortic atherosclerosis (ICD10-I70.0). Coronary artery calcification.   Electronically Signed   By: Lorin Picket M.D.   On: 10/01/2020 10:31  Assessment/Plan: BPH with BOO with tiny bladder stones and gross hematuria:  He is doing well on the finasteride without further bleeding.   He has pyuria and some microhematuria which is stable.  I will get a culture.   Elevated PSA.   He has a long history of an elevated PSA.  His prostate is very large (352ml).  His PSA is falling appropriately with the finasteride.  Renal atrophy on the right with small stones.  Renal cyst on the left.    No orders of the defined types were placed in this encounter.    Orders Placed This Encounter  Procedures  . Urine Culture  . Microscopic Examination  . Urinalysis, Routine w reflex microscopic  . PSA, total and free    Standing Status:   Future    Standing Expiration Date:   01/15/2022     Return in about 6 months (around 07/18/2021) for with PSA.    CC: Dr. Darlina Sicilian.     Irine Seal 01/16/[id number] ID: Bryan Vargas, male   DOB: 1951/02/11, 70 y.o.   MRN: [id number]

## 2021-01-18 LAB — URINE CULTURE: Organism ID, Bacteria: NO GROWTH

## 2021-01-21 NOTE — Progress Notes (Signed)
Results mailed 

## 2021-02-03 ENCOUNTER — Ambulatory Visit: Payer: Medicare Other | Admitting: Gastroenterology

## 2021-02-17 ENCOUNTER — Encounter (HOSPITAL_COMMUNITY): Payer: Self-pay | Admitting: *Deleted

## 2021-02-17 ENCOUNTER — Emergency Department (HOSPITAL_COMMUNITY): Payer: Medicare Other

## 2021-02-17 ENCOUNTER — Emergency Department (HOSPITAL_COMMUNITY)
Admission: EM | Admit: 2021-02-17 | Discharge: 2021-02-17 | Disposition: A | Discharge status: Home / Self Care | Payer: Medicare Other | Attending: Emergency Medicine | Admitting: Emergency Medicine

## 2021-02-17 ENCOUNTER — Other Ambulatory Visit: Payer: Self-pay

## 2021-02-17 DIAGNOSIS — K5900 Constipation, unspecified: Secondary | ICD-10-CM | POA: Diagnosis not present

## 2021-02-17 DIAGNOSIS — Z79899 Other long term (current) drug therapy: Secondary | ICD-10-CM | POA: Insufficient documentation

## 2021-02-17 DIAGNOSIS — R109 Unspecified abdominal pain: Secondary | ICD-10-CM | POA: Diagnosis present

## 2021-02-17 DIAGNOSIS — E119 Type 2 diabetes mellitus without complications: Secondary | ICD-10-CM | POA: Insufficient documentation

## 2021-02-17 MED ORDER — METOCLOPRAMIDE HCL 10 MG PO TABS: 10.0000 mg | ORAL_TABLET | Freq: Three times a day (TID) | ORAL | 0 refills | Status: DC | PRN

## 2021-02-17 NOTE — ED Provider Notes (Signed)
[facility] EMERGENCY DEPARTMENT Provider Note   CSN: [id number] Arrival date & time: 02/17/21  1320     History Chief Complaint  Patient presents with   Abdominal Pain    Bryan Vargas is a 70 y.o. male.  HPI Patient is a 70 year old male with a history of diabetes as well as intermittent constipation who presents to the emergency department due to constipation.  Symptoms started worsening about 2 weeks ago.  States that last week he did not have a bowel movement for about 4 days.  He became constipated over the past few days as well and use a suppository this morning producing a hard stool.  Patient also notes that at times when he eats a large meal he feels as if the food will sit in his stomach and will not digest quickly.  Sometimes he will have abdominal pressure from this and will cause himself to vomit to relieve this.  Denies any current nausea.  No diarrhea.  No other complaints.    Past Medical History:  Diagnosis Date   Arthritis    Diabetes ([place])    Gout    Hepatitis     There are no problems to display for this patient.   Past Surgical History:  Procedure Laterality Date   arthritis     CYSTOSCOPY W/ URETERAL STENT PLACEMENT     EXTRACORPOREAL SHOCK WAVE LITHOTRIPSY     EXTRACORPOREAL SHOCK WAVE LITHOTRIPSY     PROSTATE BIOPSY     TONSILLECTOMY         History reviewed. No pertinent family history.  Social History   Tobacco Use   Smoking status: Never   Smokeless tobacco: Never  Substance Use Topics   Alcohol use: Yes    Home Medications Prior to Admission medications   Medication Sig Start Date End Date Taking? Authorizing Provider  metoCLOPramide (REGLAN) 10 MG tablet Take 1 tablet (10 mg total) by mouth every 8 (eight) hours as needed for nausea. 02/17/21  Yes Rayna Sexton, PA-C  azelastine (ASTELIN) 0.1 % nasal spray Place into both nostrils. 07/14/20   [provider]  brimonidine (ALPHAGAN) 0.2 % ophthalmic solution SMARTSIG:In  Eye(s) 04/10/20   [provider]  finasteride (PROSCAR) 5 MG tablet Take 1 tablet (5 mg total) by mouth daily. 10/16/20   Irine Seal, MD  JANUVIA 100 MG tablet Take 100 mg by mouth daily. 07/09/20   [provider]  latanoprost (XALATAN) 0.005 % ophthalmic solution SMARTSIG:In Eye(s) 07/03/20   [provider]  LEVEMIR FLEXTOUCH 100 UNIT/ML FlexPen SMARTSIG:20 Unit(s) SUB-Q Daily 10/06/20   [provider]  omeprazole (PRILOSEC) 40 MG capsule Take 40 mg by mouth daily. 02/11/21   [provider]  tamsulosin (FLOMAX) 0.4 MG CAPS capsule Take 0.4 mg by mouth daily. 07/09/20   [provider]    Allergies    Patient has no known allergies.  Review of Systems   Review of Systems  All other systems reviewed and are negative. Ten systems reviewed and are negative for acute change, except as noted in the HPI.   Physical Exam Updated Vital Signs BP (!) 147/93   Pulse 75   Temp 98.1 F (36.7 C) (Oral)   Resp 20   Ht 6\' 3"  (1.905 m)   Wt 106.6 kg   SpO2 99%   BMI 29.37 kg/m   Physical Exam Vitals and nursing note reviewed.  Constitutional:      General: He is not in acute distress.  Appearance: Normal appearance. He is not ill-appearing, toxic-appearing or diaphoretic.  HENT:     Head: Normocephalic and atraumatic.     Right Ear: External ear normal.     Left Ear: External ear normal.     Nose: Nose normal.     Mouth/Throat:     Mouth: Mucous membranes are moist.     Pharynx: Oropharynx is clear. No oropharyngeal exudate or posterior oropharyngeal erythema.  Eyes:     Extraocular Movements: Extraocular movements intact.  Cardiovascular:     Rate and Rhythm: Normal rate and regular rhythm.     Pulses: Normal pulses.     Heart sounds: Normal heart sounds. No murmur heard.   No friction rub. No gallop.     Comments: RRR without M/R/G. Pulmonary:     Effort: Pulmonary effort is normal. No respiratory distress.     Breath  sounds: Normal breath sounds. No stridor. No wheezing, rhonchi or rales.  Abdominal:     General: Abdomen is flat and protuberant.     Palpations: Abdomen is soft.     Tenderness: There is no abdominal tenderness.     Comments: Protuberant abdomen that is soft and nontender in all 4 quadrants.  Normoactive bowel sounds.  Musculoskeletal:        General: Normal range of motion.     Cervical back: Normal range of motion and neck supple. No tenderness.  Skin:    General: Skin is warm and dry.  Neurological:     General: No focal deficit present.     Mental Status: He is alert and oriented to person, place, and time.  Psychiatric:        Mood and Affect: Mood normal.        Behavior: Behavior normal.   ED Results / Procedures / Treatments   Labs (all labs ordered are listed, but only abnormal results are displayed) Labs Reviewed - No data to display  EKG None  Radiology DG Abdomen Acute W/Chest  Result Date: 02/17/2021 CLINICAL DATA:  Abdomen pain constipation EXAM: DG ABDOMEN ACUTE WITH 1 VIEW CHEST COMPARISON:  CT 09/30/2020 FINDINGS: Single-view chest demonstrates no focal opacity or pleural effusion. Normal cardiomediastinal silhouette. No pneumothorax. Supine and upright views of the abdomen demonstrate no free air beneath the diaphragm. Moderate gastric distension. Nonspecific decreased small bowel gas. Mild to moderate stool in the right colon. No radiopaque calculi over the kidneys. Coarse calcifications in the region of the prostate gland IMPRESSION: 1. No radiographic evidence for acute cardiopulmonary abnormality. 2. Nonobstructed gas pattern with overall decreased small bowel gas. Mild to moderate stool in the right colon 3. Moderate gastric distension Electronically Signed   By: Donavan Foil M.D.   On: 02/17/2021 15:14    Procedures Procedures   Medications Ordered in ED Medications - No data to display  ED Course  I have reviewed the triage vital signs and the nursing  notes.  Pertinent labs & imaging results that were available during my care of the patient were reviewed by me and considered in my medical decision making (see chart for details).    MDM Rules/Calculators/A&P                          Pt is a 70 y.o. male who presents to the emergency department with intermittent constipation as well as intermittent nausea after eating large meals.  Imaging: X-rays of the abdomen and chest showed no radiographic evidence for acute  cardiopulmonary abnormalities.  Nonobstructive gas pattern with overall decreased small bowel gas.  Mild to moderate stool in the right colon.  Moderate gastric distention.  I, Rayna Sexton, PA-C, personally reviewed and evaluated these images and lab results as part of my medical decision-making.  Patient states that he had constipation for about 4 days last week and has continued to have intermittent bowel movements but notes that they are harder than normal and difficult to pass.  He used an enema this morning and notes passing a hard stool.  He has no tenderness in the abdomen on my exam.  Normoactive bowel sounds.  X-rays are showing a nonobstructive gas pattern with mild to moderate stool in the right colon.  Will prescribe a course of Reglan for his nausea.  Discussed using MiraLAX for his constipation.  We discussed dosing.  Given his reassuring physical exam as well as his reassuring vital signs and imaging did not feel that lab work was warranted at this visit.  Discussed return precautions at length.  He is going to follow-up with gastroenterology on July 15.  His questions were answered and he was amicable at the time of discharge.  Note: Portions of this report may have been transcribed using voice recognition software. Every effort was made to ensure accuracy; however, inadvertent computerized transcription errors may be present.   Final Clinical Impression(s) / ED Diagnoses Final diagnoses:  Constipation,  unspecified constipation type   Rx / DC Orders ED Discharge Orders          Ordered    metoCLOPramide (REGLAN) 10 MG tablet  Every 8 hours PRN        02/17/21 1613             Rayna Sexton, PA-C 02/17/21 1620    Truddie Hidden, MD 02/18/21 [phone]

## 2021-02-17 NOTE — ED Triage Notes (Signed)
Abdominal pain  for 2 weeks, states he does not feel like he is digesting his food. Also has constipation

## 2021-02-17 NOTE — Discharge Instructions (Addendum)
I prescribed you a medication called Reglan.  This is a nausea medication you can take as needed when you are experiencing abdominal distention as well as nausea after meals.  Only take this as prescribed.  For your constipation I would recommend beginning MiraLAX.  You can take a scoop in the morning and see if this helps with your symptoms.  If you get no relief you can also try 1 scoop twice a day.  Please follow-up with your gastroenterologist next month your appointment.  If you develop any new or worsening symptoms please come back to the emergency department.  It was a pleasure to meet you.

## 2021-02-25 ENCOUNTER — Inpatient Hospital Stay (HOSPITAL_COMMUNITY)
Admission: EM | Admit: 2021-02-25 | Discharge: 2021-03-04 | DRG: 381 | Disposition: A | Discharge status: Home / Self Care | Payer: Medicare Other | Attending: Internal Medicine | Admitting: Internal Medicine

## 2021-02-25 ENCOUNTER — Other Ambulatory Visit: Payer: Self-pay

## 2021-02-25 ENCOUNTER — Encounter (HOSPITAL_COMMUNITY): Payer: Self-pay

## 2021-02-25 ENCOUNTER — Emergency Department (HOSPITAL_COMMUNITY): Payer: Medicare Other

## 2021-02-25 DIAGNOSIS — N179 Acute kidney failure, unspecified: Secondary | ICD-10-CM | POA: Diagnosis present

## 2021-02-25 DIAGNOSIS — R6881 Early satiety: Secondary | ICD-10-CM | POA: Diagnosis present

## 2021-02-25 DIAGNOSIS — E119 Type 2 diabetes mellitus without complications: Secondary | ICD-10-CM

## 2021-02-25 DIAGNOSIS — E872 Acidosis, unspecified: Secondary | ICD-10-CM | POA: Diagnosis present

## 2021-02-25 DIAGNOSIS — R1110 Vomiting, unspecified: Secondary | ICD-10-CM | POA: Diagnosis present

## 2021-02-25 DIAGNOSIS — K219 Gastro-esophageal reflux disease without esophagitis: Secondary | ICD-10-CM | POA: Diagnosis present

## 2021-02-25 DIAGNOSIS — K3189 Other diseases of stomach and duodenum: Secondary | ICD-10-CM | POA: Diagnosis not present

## 2021-02-25 DIAGNOSIS — T83512A Infection and inflammatory reaction due to nephrostomy catheter, initial encounter: Secondary | ICD-10-CM | POA: Diagnosis not present

## 2021-02-25 DIAGNOSIS — E860 Dehydration: Secondary | ICD-10-CM | POA: Diagnosis present

## 2021-02-25 DIAGNOSIS — K2980 Duodenitis without bleeding: Secondary | ICD-10-CM | POA: Diagnosis present

## 2021-02-25 DIAGNOSIS — Z20822 Contact with and (suspected) exposure to covid-19: Secondary | ICD-10-CM | POA: Diagnosis not present

## 2021-02-25 DIAGNOSIS — K279 Peptic ulcer, site unspecified, unspecified as acute or chronic, without hemorrhage or perforation: Secondary | ICD-10-CM | POA: Diagnosis present

## 2021-02-25 DIAGNOSIS — M109 Gout, unspecified: Secondary | ICD-10-CM | POA: Diagnosis present

## 2021-02-25 DIAGNOSIS — I8500 Esophageal varices without bleeding: Secondary | ICD-10-CM | POA: Diagnosis present

## 2021-02-25 DIAGNOSIS — Z7984 Long term (current) use of oral hypoglycemic drugs: Secondary | ICD-10-CM | POA: Diagnosis not present

## 2021-02-25 DIAGNOSIS — N281 Cyst of kidney, acquired: Secondary | ICD-10-CM | POA: Diagnosis present

## 2021-02-25 DIAGNOSIS — I8510 Secondary esophageal varices without bleeding: Secondary | ICD-10-CM | POA: Diagnosis present

## 2021-02-25 DIAGNOSIS — T183XXA Foreign body in small intestine, initial encounter: Secondary | ICD-10-CM | POA: Diagnosis not present

## 2021-02-25 DIAGNOSIS — R197 Diarrhea, unspecified: Secondary | ICD-10-CM | POA: Diagnosis present

## 2021-02-25 DIAGNOSIS — R600 Localized edema: Secondary | ICD-10-CM | POA: Diagnosis not present

## 2021-02-25 DIAGNOSIS — Z79899 Other long term (current) drug therapy: Secondary | ICD-10-CM

## 2021-02-25 DIAGNOSIS — R338 Other retention of urine: Secondary | ICD-10-CM | POA: Diagnosis present

## 2021-02-25 DIAGNOSIS — K209 Esophagitis, unspecified without bleeding: Secondary | ICD-10-CM | POA: Diagnosis not present

## 2021-02-25 DIAGNOSIS — M1990 Unspecified osteoarthritis, unspecified site: Secondary | ICD-10-CM | POA: Diagnosis not present

## 2021-02-25 DIAGNOSIS — R319 Hematuria, unspecified: Secondary | ICD-10-CM | POA: Diagnosis not present

## 2021-02-25 DIAGNOSIS — R112 Nausea with vomiting, unspecified: Secondary | ICD-10-CM | POA: Diagnosis not present

## 2021-02-25 DIAGNOSIS — D72829 Elevated white blood cell count, unspecified: Secondary | ICD-10-CM | POA: Diagnosis present

## 2021-02-25 DIAGNOSIS — K2289 Other specified disease of esophagus: Secondary | ICD-10-CM | POA: Diagnosis not present

## 2021-02-25 DIAGNOSIS — E876 Hypokalemia: Secondary | ICD-10-CM | POA: Diagnosis not present

## 2021-02-25 DIAGNOSIS — N390 Urinary tract infection, site not specified: Secondary | ICD-10-CM | POA: Diagnosis not present

## 2021-02-25 DIAGNOSIS — I4891 Unspecified atrial fibrillation: Secondary | ICD-10-CM | POA: Diagnosis not present

## 2021-02-25 DIAGNOSIS — R933 Abnormal findings on diagnostic imaging of other parts of digestive tract: Secondary | ICD-10-CM | POA: Diagnosis not present

## 2021-02-25 DIAGNOSIS — N400 Enlarged prostate without lower urinary tract symptoms: Secondary | ICD-10-CM | POA: Diagnosis present

## 2021-02-25 DIAGNOSIS — K862 Cyst of pancreas: Secondary | ICD-10-CM | POA: Diagnosis not present

## 2021-02-25 DIAGNOSIS — K311 Adult hypertrophic pyloric stenosis: Secondary | ICD-10-CM | POA: Diagnosis present

## 2021-02-25 DIAGNOSIS — K2100 Gastro-esophageal reflux disease with esophagitis, without bleeding: Secondary | ICD-10-CM | POA: Diagnosis present

## 2021-02-25 DIAGNOSIS — K222 Esophageal obstruction: Secondary | ICD-10-CM | POA: Diagnosis not present

## 2021-02-25 DIAGNOSIS — Z794 Long term (current) use of insulin: Secondary | ICD-10-CM

## 2021-02-25 DIAGNOSIS — K315 Obstruction of duodenum: Secondary | ICD-10-CM | POA: Diagnosis not present

## 2021-02-25 DIAGNOSIS — N261 Atrophy of kidney (terminal): Secondary | ICD-10-CM | POA: Diagnosis present

## 2021-02-25 DIAGNOSIS — K259 Gastric ulcer, unspecified as acute or chronic, without hemorrhage or perforation: Secondary | ICD-10-CM | POA: Diagnosis not present

## 2021-02-25 LAB — CBC WITH DIFFERENTIAL/PLATELET
Abs Immature Granulocytes: 0.28 10*3/uL — ABNORMAL HIGH (ref 0.00–0.07)
Basophils Absolute: 0.1 10*3/uL (ref 0.0–0.1)
Basophils Relative: 0 %
Eosinophils Absolute: 0.4 10*3/uL (ref 0.0–0.5)
Eosinophils Relative: 2 %
HCT: 49.4 % (ref 39.0–52.0)
Hemoglobin: 16.4 g/dL (ref 13.0–17.0)
Immature Granulocytes: 1 %
Lymphocytes Relative: 7 %
Lymphs Abs: 1.6 10*3/uL (ref 0.7–4.0)
MCH: 32.3 pg (ref 26.0–34.0)
MCHC: 33.2 g/dL (ref 30.0–36.0)
MCV: 97.2 fL (ref 80.0–100.0)
Monocytes Absolute: 1.6 10*3/uL — ABNORMAL HIGH (ref 0.1–1.0)
Monocytes Relative: 7 %
Neutro Abs: 19.5 10*3/uL — ABNORMAL HIGH (ref 1.7–7.7)
Neutrophils Relative %: 83 %
Platelets: 357 10*3/uL (ref 150–400)
RBC: 5.08 MIL/uL (ref 4.22–5.81)
RDW: 13.3 % (ref 11.5–15.5)
WBC: 23.4 10*3/uL — ABNORMAL HIGH (ref 4.0–10.5)
nRBC: 0 % (ref 0.0–0.2)

## 2021-02-25 LAB — COMPREHENSIVE METABOLIC PANEL
ALT: 20 U/L (ref 0–44)
AST: 13 U/L — ABNORMAL LOW (ref 15–41)
Albumin: 4 g/dL (ref 3.5–5.0)
Alkaline Phosphatase: 86 U/L (ref 38–126)
Anion gap: 11 (ref 5–15)
BUN: 37 mg/dL — ABNORMAL HIGH (ref 8–23)
CO2: 16 mmol/L — ABNORMAL LOW (ref 22–32)
Calcium: 9.6 mg/dL (ref 8.9–10.3)
Chloride: 107 mmol/L (ref 98–111)
Creatinine, Ser: 2.62 mg/dL — ABNORMAL HIGH (ref 0.61–1.24)
GFR, Estimated: 26 mL/min — ABNORMAL LOW (ref >60)
Glucose, Bld: 192 mg/dL — ABNORMAL HIGH (ref 70–99)
Potassium: 3.7 mmol/L (ref 3.5–5.1)
Sodium: 134 mmol/L — ABNORMAL LOW (ref 135–145)
Total Bilirubin: 1 mg/dL (ref 0.3–1.2)
Total Protein: 7.3 g/dL (ref 6.5–8.1)

## 2021-02-25 LAB — URINALYSIS, ROUTINE W REFLEX MICROSCOPIC
Glucose, UA: NEGATIVE mg/dL
Ketones, ur: NEGATIVE mg/dL
Nitrite: NEGATIVE
Protein, ur: 100 mg/dL — AB
Specific Gravity, Urine: 1.019 (ref 1.005–1.030)
WBC, UA: >50 WBC/hpf — ABNORMAL HIGH (ref 0–5)
pH: 5 (ref 5.0–8.0)

## 2021-02-25 LAB — C DIFFICILE QUICK SCREEN W PCR REFLEX
C Diff antigen: NEGATIVE
C Diff interpretation: NOT DETECTED
C Diff toxin: NEGATIVE

## 2021-02-25 LAB — MAGNESIUM: Magnesium: 1.9 mg/dL (ref 1.7–2.4)

## 2021-02-25 LAB — RESP PANEL BY RT-PCR (FLU A&B, COVID) ARPGX2
Influenza A by PCR: NEGATIVE
Influenza B by PCR: NEGATIVE
SARS Coronavirus 2 by RT PCR: NEGATIVE

## 2021-02-25 LAB — LACTIC ACID, PLASMA
Lactic Acid, Venous: 1.7 mmol/L (ref 0.5–1.9)
Lactic Acid, Venous: 1 mmol/L (ref 0.5–1.9)

## 2021-02-25 MED ORDER — SODIUM CHLORIDE 0.9 % IV SOLN: INTRAVENOUS | Status: DC

## 2021-02-25 MED ORDER — LATANOPROST 0.005 % OP SOLN
1.0000 [drp] | Freq: Every day | OPHTHALMIC | Status: DC
  Administered 2021-02-26 – 2021-03-03 (×6): 1 [drp] via OPHTHALMIC
  Filled 2021-02-25: qty 2.5

## 2021-02-25 MED ORDER — SODIUM CHLORIDE 0.9 % IV BOLUS
500.0000 mL | Freq: Once | INTRAVENOUS | Status: AC
  Administered 2021-02-25: 500 mL via INTRAVENOUS

## 2021-02-25 MED ORDER — SODIUM CHLORIDE 0.9 % IV SOLN
1.0000 g | Freq: Once | INTRAVENOUS | Status: AC
  Administered 2021-02-25: 1 g via INTRAVENOUS
  Filled 2021-02-25: qty 10

## 2021-02-25 MED ORDER — ACETAMINOPHEN 325 MG PO TABS
650.0000 mg | ORAL_TABLET | Freq: Four times a day (QID) | ORAL | Status: DC | PRN
  Administered 2021-02-26 – 2021-03-01 (×5): 650 mg via ORAL
  Filled 2021-02-25 (×5): qty 2

## 2021-02-25 MED ORDER — SODIUM CHLORIDE 0.9 % IV BOLUS
1000.0000 mL | Freq: Once | INTRAVENOUS | Status: AC
  Administered 2021-02-25: 1000 mL via INTRAVENOUS

## 2021-02-25 MED ORDER — FINASTERIDE 5 MG PO TABS
5.0000 mg | ORAL_TABLET | Freq: Every day | ORAL | Status: DC
  Administered 2021-02-26 – 2021-03-01 (×4): 5 mg via ORAL
  Filled 2021-02-25 (×5): qty 1

## 2021-02-25 MED ORDER — SODIUM CHLORIDE 0.9 % IV BOLUS: 1000.0000 mL | Freq: Once | INTRAVENOUS | Status: DC

## 2021-02-25 MED ORDER — ACETAMINOPHEN 650 MG RE SUPP: 650.0000 mg | Freq: Four times a day (QID) | RECTAL | Status: DC | PRN

## 2021-02-25 MED ORDER — ASPIRIN EC 81 MG PO TBEC
81.0000 mg | DELAYED_RELEASE_TABLET | Freq: Every day | ORAL | Status: DC
  Administered 2021-02-26: 81 mg via ORAL
  Filled 2021-02-25 (×2): qty 1

## 2021-02-25 MED ORDER — SODIUM CHLORIDE 0.9 % IV SOLN
1.0000 g | INTRAVENOUS | Status: DC
  Administered 2021-02-26: 1 g via INTRAVENOUS
  Filled 2021-02-25: qty 10

## 2021-02-25 MED ORDER — HEPARIN SODIUM (PORCINE) 5000 UNIT/ML IJ SOLN
5000.0000 [IU] | Freq: Three times a day (TID) | INTRAMUSCULAR | Status: DC
  Administered 2021-02-26 – 2021-02-27 (×2): 5000 [IU] via SUBCUTANEOUS
  Filled 2021-02-25 (×2): qty 1

## 2021-02-25 MED ORDER — METOCLOPRAMIDE HCL 5 MG/ML IJ SOLN
5.0000 mg | Freq: Four times a day (QID) | INTRAMUSCULAR | Status: DC | PRN
  Administered 2021-02-26: 5 mg via INTRAVENOUS
  Filled 2021-02-25: qty 2

## 2021-02-25 MED ORDER — ONDANSETRON HCL 4 MG/2ML IJ SOLN: 4.0000 mg | Freq: Once | INTRAMUSCULAR | Status: DC

## 2021-02-25 MED ORDER — INSULIN DETEMIR 100 UNIT/ML ~~LOC~~ SOLN
10.0000 [IU] | Freq: Every day | SUBCUTANEOUS | Status: DC
  Administered 2021-02-26 – 2021-03-04 (×7): 10 [IU] via SUBCUTANEOUS
  Filled 2021-02-25 (×9): qty 0.1

## 2021-02-25 MED ORDER — PANTOPRAZOLE SODIUM 40 MG PO TBEC
40.0000 mg | DELAYED_RELEASE_TABLET | Freq: Every day | ORAL | Status: DC
  Administered 2021-02-26: 40 mg via ORAL
  Filled 2021-02-25 (×2): qty 1

## 2021-02-25 NOTE — ED Provider Notes (Signed)
[facility] Provider Note   CSN: [id number] Arrival date & time: 02/25/21  1213     History Chief Complaint  Patient presents with   Dehydration    Bryan Vargas is a 70 y.o. male with a history of diabetes who presents to the emergency department with concern for dehydration due to excessive diarrhea since his last emergency department visit.  Patient states he was seen little over a week ago as he was having problems with constipation, he was given a prescription for MiraLAX which he took with improvement but then subsequent diarrhea.  Patient states he stopped taking the MiraLAX 3 days ago but continues to have up to 20 loose stools per day.  He has had some associated nausea with this with one episode of emesis per night.  No significant alleviating or aggravating factors.  He denies fever, chills, abdominal pain, dizziness, syncope hematemesis, melena, or hematochezia.  Denies recent foreign travel or antibiotic use.  HPI     Past Medical History:  Diagnosis Date   Arthritis    Coronary artery calcification seen on CAT scan 09/2020   Diabetes ([place])    Gout    Hepatitis     There are no problems to display for this patient.   Past Surgical History:  Procedure Laterality Date   arthritis     CYSTOSCOPY W/ URETERAL STENT PLACEMENT     EXTRACORPOREAL SHOCK WAVE LITHOTRIPSY     EXTRACORPOREAL SHOCK WAVE LITHOTRIPSY     PROSTATE BIOPSY     TONSILLECTOMY         History reviewed. No pertinent family history.  Social History   Tobacco Use   Smoking status: Never   Smokeless tobacco: Never  Substance Use Topics   Alcohol use: Yes    Home Medications Prior to Admission medications   Medication Sig Start Date End Date Taking? Authorizing Provider  azelastine (ASTELIN) 0.1 % nasal spray Place into both nostrils. 07/14/20   [provider]  brimonidine (ALPHAGAN) 0.2 % ophthalmic solution SMARTSIG:In Eye(s) 04/10/20   [provider]  finasteride (PROSCAR) 5 MG tablet Take 1 tablet (5 mg total) by mouth daily. 10/16/20   Irine Seal, MD  JANUVIA 100 MG tablet Take 100 mg by mouth daily. 07/09/20   [provider]  latanoprost (XALATAN) 0.005 % ophthalmic solution SMARTSIG:In Eye(s) 07/03/20   [provider]  LEVEMIR FLEXTOUCH 100 UNIT/ML FlexPen SMARTSIG:20 Unit(s) SUB-Q Daily 10/06/20   [provider]  metoCLOPramide (REGLAN) 10 MG tablet Take 1 tablet (10 mg total) by mouth every 8 (eight) hours as needed for nausea. 02/17/21   Rayna Sexton, PA-C  omeprazole (PRILOSEC) 40 MG capsule Take 40 mg by mouth daily. 02/11/21   [provider]  tamsulosin (FLOMAX) 0.4 MG CAPS capsule Take 0.4 mg by mouth daily. 07/09/20   [provider]    Allergies    Patient has no known allergies.  Review of Systems   Review of Systems  Constitutional:  Negative for chills and fever.  Respiratory:  Negative for shortness of breath.   Cardiovascular:  Negative for chest pain.  Gastrointestinal:  Positive for diarrhea, nausea and vomiting. Negative for abdominal pain, anal bleeding and blood in stool.  Genitourinary:  Negative for difficulty urinating and dysuria.  Neurological:  Negative for syncope.  All other systems reviewed and are negative.  Physical Exam Updated Vital Signs BP 109/77   Pulse 99   Temp 97.8 F (36.6 C) (Oral)  Resp 17   Ht 6\' 3"  (1.905 m)   Wt 102.5 kg   SpO2 97%   BMI 28.25 kg/m   Physical Exam Vitals and nursing note reviewed.  Constitutional:      General: He is not in acute distress.    Appearance: He is well-developed. He is not toxic-appearing.  HENT:     Head: Normocephalic and atraumatic.     Mouth/Throat:     Mouth: Mucous membranes are dry.  Eyes:     General:        Right eye: No discharge.        Left eye: No discharge.     Conjunctiva/sclera: Conjunctivae normal.  Cardiovascular:     Rate and Rhythm: Normal rate and regular  rhythm.  Pulmonary:     Effort: Pulmonary effort is normal. No respiratory distress.     Breath sounds: Normal breath sounds. No wheezing, rhonchi or rales.  Abdominal:     General: There is no distension.     Palpations: Abdomen is soft.     Tenderness: There is no abdominal tenderness. There is no guarding or rebound.  Musculoskeletal:     Cervical back: Neck supple.  Skin:    General: Skin is warm and dry.     Findings: No rash.  Neurological:     Mental Status: He is alert.     Comments: Clear speech.   Psychiatric:        Behavior: Behavior normal.    ED Results / Procedures / Treatments   Labs (all labs ordered are listed, but only abnormal results are displayed) Labs Reviewed  COMPREHENSIVE METABOLIC PANEL - Abnormal; Notable for the following components:      Result Value   Sodium 134 (*)    CO2 16 (*)    Glucose, Bld 192 (*)    BUN 37 (*)    Creatinine, Ser 2.62 (*)    AST 13 (*)    GFR, Estimated 26 (*)    All other components within normal limits  CBC WITH DIFFERENTIAL/PLATELET - Abnormal; Notable for the following components:   WBC 23.4 (*)    Neutro Abs 19.5 (*)    Monocytes Absolute 1.6 (*)    Abs Immature Granulocytes 0.28 (*)    All other components within normal limits  RESP PANEL BY RT-PCR (FLU A&B, COVID) ARPGX2  C DIFFICILE QUICK SCREEN W PCR REFLEX    GASTROINTESTINAL PANEL BY PCR, STOOL (REPLACES STOOL CULTURE)  MAGNESIUM  URINALYSIS, ROUTINE W REFLEX MICROSCOPIC    EKG None  Radiology CT Abdomen Pelvis Wo Contrast  Result Date: 02/25/2021 CLINICAL DATA:  Diarrhea for 4 days. Leukocytosis. Acute kidney injury. EXAM: CT ABDOMEN AND PELVIS WITHOUT CONTRAST TECHNIQUE: Multidetector CT imaging of the abdomen and pelvis was performed following the standard protocol without IV contrast. COMPARISON:  09/30/2020 FINDINGS: Lower chest: No acute findings. Hepatobiliary: No mass visualized on this unenhanced exam. Gallbladder is distended but otherwise  unremarkable in appearance. No evidence of biliary ductal dilatation. Pancreas: 2 adjacent lesions are seen in the pancreatic tail, largest measuring 2.5 cm, which cannot be characterized on this unenhanced exam. No evidence of peripancreatic inflammatory changes. Spleen:  Within normal limits in size. Adrenals/Urinary tract: Severe diffuse right renal atrophy is seen. Multiple left renal cysts are stable including a few hemorrhagic cysts. No evidence of ureteral calculi or hydronephrosis. Diffuse bladder wall thickening is again seen, consistent with chronic bladder outlet obstruction. A few tiny less than 5 mm calculi  are noted in the urinary bladder. Stomach/Bowel: Stomach is distended, but small and large bowel are nondilated. No evidence of obstruction, inflammatory process, or abnormal fluid collections. Vascular/Lymphatic: No pathologically enlarged lymph nodes identified. No evidence of abdominal aortic aneurysm. Reproductive:  Stable markedly enlarged prostate. Other:  None. Musculoskeletal:  No suspicious bone lesions identified. IMPRESSION: Markedly distended stomach. Gastroparesis or gastric outlet obstruction cannot be excluded. Stable markedly enlarged prostate and findings of chronic bladder outlet obstruction. Tiny less than 5 mm urinary bladder calculi. No evidence of ureteral calculi or hydronephrosis. 2 adjacent lesions in pancreatic tail, largest measuring 2.5 cm, which cannot be characterized on this unenhanced exam. Pancreas protocol abdomen MRI or CT without and with contrast recommended for further characterization. Electronically Signed   By: Marlaine Hind M.D.   On: 02/25/2021 16:52    Procedures Procedures   Medications Ordered in ED Medications  ondansetron (ZOFRAN) injection 4 mg (has no administration in time range)  sodium chloride 0.9 % bolus 1,000 mL (1,000 mLs Intravenous New Bag/Given 02/25/21 1420)    ED Course  I have reviewed the triage vital signs and the nursing  notes.  Pertinent labs & imaging results that were available during my care of the patient were reviewed by me and considered in my medical decision making (see chart for details).    MDM Rules/Calculators/A&P                         Patient presents to the ED with complaints of diarrhea.  Nontoxic, soft blood pressure noted, initial mild tachycardia resolved on my exam.  Abdomen is nontender without peritoneal signs.  Mucous membranes are dry.  Additional history obtained:  Additional history obtained from chart review & nursing note review.   Lab Tests:  I Ordered, reviewed, and interpreted labs, which included:  CBC: Leukocytosis at 23,000 with left shift. CMP: Acute kidney injury with creatinine 2.61 BUN of 37, most recent creatinine on record is 1.0. Magnesium: Within normal limits  Patient with acute renal failure and leukocytosis, will check stool panel including C. difficile, obtain CT abdomen pelvis without contrast, and obtain postvoid residual.  Plan for admission at this time.  Imaging Studies ordered:  I ordered imaging studies which included CT A/P WO, I independently reviewed, formal radiology impression shows:   Markedly distended stomach. Gastroparesis or gastric outlet obstruction cannot be excluded. Stable markedly enlarged prostate and findings of chronic bladder outlet obstruction. Tiny less than 5 mm urinary bladder calculi. No evidence of ureteral calculi or hydronephrosis. 2 adjacent lesions in pancreatic tail, largest measuring 2.5 cm, which cannot be characterized on this unenhanced exam. Pancreas protocol abdomen MRI or CT without and with contrast recommended for further characterization.  ED Course:  CT abdomen/pelvis with multiple findings above, no acute infectious etiology, additional findings as above.  Urinalysis with findings concerning for infection, given this with his leukocytosis plan for lactic acid, blood cultures, and IV Rocephin.  Patient without  significant urine on postvoid residual, he is urinating independently.  His stool cultures are pending.  Will discuss with hospitalist service for admission for AKI with UTI.  18: 15: CONSULT: Discussed with hospitalist Dr.Elgergawy- accepts admission.   Findings and plan of care discussed with supervising physician Dr. Kathrynn Humble who has evaluated the patient & subsequently Dr. Sabra Heck at change of shift- in agreement.    Portions of this note were generated with L[employer]. Dictation errors may occur despite best attempts at  proofreading.  Final Clinical Impression(s) / ED Diagnoses Final diagnoses:  None    Rx / DC Orders ED Discharge Orders     None        Amaryllis Dyke, PA-C 02/25/21 1818    Varney Biles, MD 03/03/21 1642

## 2021-02-25 NOTE — ED Notes (Signed)
Pt ambulated to BR with steady gait.

## 2021-02-25 NOTE — ED Notes (Signed)
Pt ambulated unassisted to restroom.  Stool sample sent

## 2021-02-25 NOTE — H&P (Signed)
[facility] H&P   Patient Demographics:    Bryan Vargas, is a 70 y.o. male  MRN: [id number]   DOB - 1951/01/08  Admit Date - 02/25/2021  Outpatient Primary MD for the patient is Milam, Florene Route, MD  Referring MD/NP/PA: PA [place]  Outpatient Specialists: Previous GI was at [facility], supposed to start following with [facility] next month  Patient coming from: Home  Chief Complaint  Patient presents with   Dehydration      HPI:    Lennex Pietila  is a 70 y.o. male, with past medical history of diabetes mellitus, BPH, presents to ED for evaluation of dehydration due to excessive diarrhea, patient had an ED visit 8 days ago, complaining of constipation, where he was started on MiraLAX, he reports he was taking MiraLAX twice daily until this Monday, he has been having multiple loose bowel movements per day, up to 20, does report over last 24 hours he did have some nausea, vomiting, denies any fever, chills, coffee-ground emesis, bright red blood per rectum, report he had screening colonoscopy at age 30, 56, at [facility], and report he was establishing with [facility] in [place], where he is supposed to have another screening colonoscopy next month, he does report history of BPH, for which he is following with at 134, he had white blood cell count of 23, hemoglobin at 16.4, urinalysis was significant for white blood cell count>50, and red blood cell 21-50, CT abdomen significant for markedly distended stomach, and lesions in the pancreatic tail, [facility] hospitalist consulted to admit.    Review of systems:    In addition to the HPI above,  No Fever-chills, reports generalized weakness, fatigue No Headache, No changes with Vision or hearing, No problems swallowing food or Liquids, No Chest pain, Cough or Shortness of Breath, No Abdominal pain, does report nausea, vomiting and diarrhea No Blood in  stool or Urine, No dysuria, No new skin rashes or bruises, No new joints pains-aches,  No new weakness, tingling, numbness in any extremity, No recent weight gain or loss, No polyuria, polydypsia or polyphagia, No significant Mental Stressors.  A full 10 point Review of Systems was done, except as stated above, all other Review of Systems were negative.   With Past History of the following :    Past Medical History:  Diagnosis Date   Arthritis    Coronary artery calcification seen on CAT scan 09/2020   Diabetes ([place])    Gout    Hepatitis       Past Surgical History:  Procedure Laterality Date   arthritis     CYSTOSCOPY W/ URETERAL STENT PLACEMENT     EXTRACORPOREAL SHOCK WAVE LITHOTRIPSY     EXTRACORPOREAL SHOCK WAVE LITHOTRIPSY     PROSTATE BIOPSY     TONSILLECTOMY        Social History:     Social History  Tobacco Use   Smoking status: Never   Smokeless tobacco: Never  Substance Use Topics   Alcohol use: Yes        Family History :    History reviewed. No pertinent family history.    Home Medications:   Prior to Admission medications   Medication Sig Start Date End Date Taking? Authorizing Provider  aspirin EC 81 MG tablet Take 81 mg by mouth daily. Swallow whole.   Yes [provider]  finasteride (PROSCAR) 5 MG tablet Take 1 tablet (5 mg total) by mouth daily. 10/16/20  Yes Irine Seal, MD  JANUVIA 100 MG tablet Take 100 mg by mouth daily. 07/09/20  Yes [provider]  latanoprost (XALATAN) 0.005 % ophthalmic solution Place 1 drop into both eyes daily. 07/03/20  Yes [provider]  LEVEMIR FLEXTOUCH 100 UNIT/ML FlexPen SMARTSIG:20 Unit(s) SUB-Q Daily 10/06/20  Yes [provider]  metoCLOPramide (REGLAN) 10 MG tablet Take 1 tablet (10 mg total) by mouth every 8 (eight) hours as needed for nausea. 02/17/21  Yes Rayna Sexton, PA-C  Multiple Vitamins-Minerals (CENTRUM ADULTS PO) Take 1 tablet by mouth daily.    Yes [provider]  omeprazole (PRILOSEC) 40 MG capsule Take 40 mg by mouth daily. 02/11/21  Yes [provider]  azelastine (ASTELIN) 0.1 % nasal spray Place into both nostrils. Patient not taking: No sig reported 07/14/20   [provider]     Allergies:    No Known Allergies   Physical Exam:   Vitals  Blood pressure 110/77, pulse 96, temperature 97.8 F (36.6 C), temperature source Oral, resp. rate 15, height 6\' 3"  (1.905 m), weight 102.5 kg, SpO2 97 %.   1. General developed male, laying in bed, no apparent distress  2. Normal affect and insight, Not Suicidal or Homicidal, Awake Alert, Oriented X 3.  3. No F.N deficits, ALL C.Nerves Intact, Strength 5/5 all 4 extremities, Sensation intact all 4 extremities, Plantars down going.  4. Ears and Eyes appear Normal, Conjunctivae clear, PERRLA. Moist Oral Mucosa.  5. Supple Neck, No JVD, No cervical lymphadenopathy appriciated, No Carotid Bruits.  6. Symmetrical Chest wall movement, Good air movement bilaterally, CTAB.  7. RRR, No Gallops, Rubs or Murmurs, No Parasternal Heave.  8. Positive Bowel Sounds, Abdomen Soft, No tenderness, No organomegaly appriciated,No rebound -guarding or rigidity.  9.  No Cyanosis, Normal Skin Turgor, No Skin Rash or Bruise.  10. Good muscle tone,  joints appear normal , no effusions, Normal ROM.  11. No Palpable Lymph Nodes in Neck or Axillae   Data Review:    CBC Recent Labs  Lab 02/25/21 1315  WBC 23.4*  HGB 16.4  HCT 49.4  PLT 357  MCV 97.2  MCH 32.3  MCHC 33.2  RDW 13.3  LYMPHSABS 1.6  MONOABS 1.6*  EOSABS 0.4  BASOSABS 0.1   ------------------------------------------------------------------------------------------------------------------  Chemistries  Recent Labs  Lab 02/25/21 1315  NA 134*  K 3.7  CL 107  CO2 16*  GLUCOSE 192*  BUN 37*  CREATININE 2.62*  CALCIUM 9.6  MG 1.9  AST 13*  ALT 20  ALKPHOS 86  BILITOT 1.0    ------------------------------------------------------------------------------------------------------------------ estimated creatinine clearance is 34.5 mL/min (A) (by C-G formula based on SCr of 2.62 mg/dL (H)). ------------------------------------------------------------------------------------------------------------------ No results for input(s): TSH, T4TOTAL, T3FREE, THYROIDAB in the last 72 hours.  Invalid input(s): FREET3  Coagulation profile No results for input(s): INR, PROTIME in the last 168 hours. ------------------------------------------------------------------------------------------------------------------- No results for input(s): DDIMER in  the last 72 hours. -------------------------------------------------------------------------------------------------------------------  Cardiac Enzymes No results for input(s): CKMB, TROPONINI, MYOGLOBIN in the last 168 hours.  Invalid input(s): CK ------------------------------------------------------------------------------------------------------------------ No results found for: BNP   ---------------------------------------------------------------------------------------------------------------  Urinalysis    Component Value Date/Time   COLORURINE AMBER (A) 02/25/2021 1424   APPEARANCEUR CLOUDY (A) 02/25/2021 1424   APPEARANCEUR Clear 01/15/2021 1534   LABSPEC 1.019 02/25/2021 1424   PHURINE 5.0 02/25/2021 1424   GLUCOSEU NEGATIVE 02/25/2021 1424   HGBUR SMALL (A) 02/25/2021 1424   BILIRUBINUR SMALL (A) 02/25/2021 1424   BILIRUBINUR Negative 01/15/2021 1534   KETONESUR NEGATIVE 02/25/2021 1424   PROTEINUR 100 (A) 02/25/2021 1424   NITRITE NEGATIVE 02/25/2021 1424   LEUKOCYTESUR LARGE (A) 02/25/2021 1424    ----------------------------------------------------------------------------------------------------------------   Imaging Results:    CT Abdomen Pelvis Wo Contrast  Result Date: 02/25/2021 CLINICAL DATA:   Diarrhea for 4 days. Leukocytosis. Acute kidney injury. EXAM: CT ABDOMEN AND PELVIS WITHOUT CONTRAST TECHNIQUE: Multidetector CT imaging of the abdomen and pelvis was performed following the standard protocol without IV contrast. COMPARISON:  09/30/2020 FINDINGS: Lower chest: No acute findings. Hepatobiliary: No mass visualized on this unenhanced exam. Gallbladder is distended but otherwise unremarkable in appearance. No evidence of biliary ductal dilatation. Pancreas: 2 adjacent lesions are seen in the pancreatic tail, largest measuring 2.5 cm, which cannot be characterized on this unenhanced exam. No evidence of peripancreatic inflammatory changes. Spleen:  Within normal limits in size. Adrenals/Urinary tract: Severe diffuse right renal atrophy is seen. Multiple left renal cysts are stable including a few hemorrhagic cysts. No evidence of ureteral calculi or hydronephrosis. Diffuse bladder wall thickening is again seen, consistent with chronic bladder outlet obstruction. A few tiny less than 5 mm calculi are noted in the urinary bladder. Stomach/Bowel: Stomach is distended, but small and large bowel are nondilated. No evidence of obstruction, inflammatory process, or abnormal fluid collections. Vascular/Lymphatic: No pathologically enlarged lymph nodes identified. No evidence of abdominal aortic aneurysm. Reproductive:  Stable markedly enlarged prostate. Other:  None. Musculoskeletal:  No suspicious bone lesions identified. IMPRESSION: Markedly distended stomach. Gastroparesis or gastric outlet obstruction cannot be excluded. Stable markedly enlarged prostate and findings of chronic bladder outlet obstruction. Tiny less than 5 mm urinary bladder calculi. No evidence of ureteral calculi or hydronephrosis. 2 adjacent lesions in pancreatic tail, largest measuring 2.5 cm, which cannot be characterized on this unenhanced exam. Pancreas protocol abdomen MRI or CT without and with contrast recommended for further  characterization. Electronically Signed   By: Marlaine Hind M.D.   On: 02/25/2021 16:52       Assessment & Plan:    Active Problems:   AKI (acute kidney injury) ([place])   Diabetes mellitus ([place])   AKI -Creatinine was normal in May, currently 2.6, no evidence of urinary retention, his UA significant for blood cell, red blood cell, by reviewing old urine analysis records it does appear as chronically elevated white blood cell and blood cells in his urine, will continue with IV fluids overnight, and if no improvement will request renal input. -We will continue with IV fluids, avoid nephrotoxic medications  UTI -Patient with pyuria, leukocytosis, will check procalcitonin, will Juliane urine cultures, will continue with Rocephin.  Nausea /Vomiting/distended stomach -Patient reports history of diabetes for around 1 year, it might be premature to think of gastroparesis, but is a possibility, I will keep on clear liquid diet, and request GI input. -Patient is diarrhea most likely related to laxative overuse, C. difficile sample in ED is negative,  GI panel is pending  Pancreatic lesions -Patient/wife report these lesions has been followed with GI [facility] with close imaging, and reports they have been told that has been stable over last 2 years.  Diabetes mellitus -Hold Januvia, will continue with Levemir at a lower dose 20> 10, and will keep on sliding scale  BPH  - continue with proscar.   DVT Prophylaxis Heparin   AM Labs Ordered, also please review Full Orders  Family Communication: Admission, patients condition and plan of care including tests being ordered have been discussed with the patient and wife who indicate understanding and agree with the plan and Code Status.  Code Status Full  Likely DC to  Home  Condition GUARDED    Consults called: renal and GI requested in Epic.    Admission status: inpatient    Time spent in minutes : 65 minutes   Phillips Climes M.D on  02/25/2021 at 7:05 PM   [facility] - Office  [phone]

## 2021-02-25 NOTE — ED Triage Notes (Signed)
Pt. States they are  dehydrated because they weight 5 lbs less than yesterday.

## 2021-02-26 ENCOUNTER — Encounter (HOSPITAL_COMMUNITY): Payer: Self-pay | Admitting: Internal Medicine

## 2021-02-26 ENCOUNTER — Inpatient Hospital Stay (HOSPITAL_COMMUNITY): Payer: Medicare Other

## 2021-02-26 DIAGNOSIS — R197 Diarrhea, unspecified: Secondary | ICD-10-CM | POA: Diagnosis present

## 2021-02-26 DIAGNOSIS — I4891 Unspecified atrial fibrillation: Secondary | ICD-10-CM | POA: Diagnosis not present

## 2021-02-26 DIAGNOSIS — R319 Hematuria, unspecified: Secondary | ICD-10-CM

## 2021-02-26 DIAGNOSIS — E872 Acidosis, unspecified: Secondary | ICD-10-CM | POA: Diagnosis present

## 2021-02-26 DIAGNOSIS — N390 Urinary tract infection, site not specified: Secondary | ICD-10-CM

## 2021-02-26 DIAGNOSIS — E860 Dehydration: Secondary | ICD-10-CM

## 2021-02-26 DIAGNOSIS — K3189 Other diseases of stomach and duodenum: Secondary | ICD-10-CM

## 2021-02-26 DIAGNOSIS — E876 Hypokalemia: Secondary | ICD-10-CM

## 2021-02-26 DIAGNOSIS — K315 Obstruction of duodenum: Principal | ICD-10-CM

## 2021-02-26 DIAGNOSIS — D72829 Elevated white blood cell count, unspecified: Secondary | ICD-10-CM

## 2021-02-26 DIAGNOSIS — K219 Gastro-esophageal reflux disease without esophagitis: Secondary | ICD-10-CM | POA: Diagnosis present

## 2021-02-26 LAB — GASTROINTESTINAL PANEL BY PCR, STOOL (REPLACES STOOL CULTURE)

## 2021-02-26 LAB — CBC
HCT: 43.3 % (ref 39.0–52.0)
Hemoglobin: 14.6 g/dL (ref 13.0–17.0)
MCH: 31.5 pg (ref 26.0–34.0)
MCHC: 33.7 g/dL (ref 30.0–36.0)
MCV: 93.5 fL (ref 80.0–100.0)
Platelets: 262 10*3/uL (ref 150–400)
RBC: 4.63 MIL/uL (ref 4.22–5.81)
RDW: 13.4 % (ref 11.5–15.5)
WBC: 19.6 10*3/uL — ABNORMAL HIGH (ref 4.0–10.5)
nRBC: 0 % (ref 0.0–0.2)

## 2021-02-26 LAB — PROTIME-INR
INR: 1.2 (ref 0.8–1.2)
Prothrombin Time: 14.9 seconds (ref 11.4–15.2)

## 2021-02-26 LAB — BASIC METABOLIC PANEL
Anion gap: 9 (ref 5–15)
BUN: 45 mg/dL — ABNORMAL HIGH (ref 8–23)
CO2: 15 mmol/L — ABNORMAL LOW (ref 22–32)
Calcium: 8.5 mg/dL — ABNORMAL LOW (ref 8.9–10.3)
Chloride: 110 mmol/L (ref 98–111)
Creatinine, Ser: 2.24 mg/dL — ABNORMAL HIGH (ref 0.61–1.24)
GFR, Estimated: 31 mL/min — ABNORMAL LOW (ref >60)
Glucose, Bld: 159 mg/dL — ABNORMAL HIGH (ref 70–99)
Potassium: 3.3 mmol/L — ABNORMAL LOW (ref 3.5–5.1)
Sodium: 134 mmol/L — ABNORMAL LOW (ref 135–145)

## 2021-02-26 LAB — ECHOCARDIOGRAM COMPLETE
AR max vel: 2.64 cm2
AV Area VTI: 2.39 cm2
AV Area mean vel: 2.43 cm2
AV Mean grad: 2 mmHg
AV Peak grad: 3.5 mmHg
Ao pk vel: 0.93 m/s
Area-P 1/2: 3.74 cm2
Height: 75 in
MV VTI: 3.34 cm2
S' Lateral: 2.93 cm
Weight: 3689.6 oz

## 2021-02-26 LAB — HIV ANTIBODY (ROUTINE TESTING W REFLEX): HIV Screen 4th Generation wRfx: NONREACTIVE

## 2021-02-26 MED ORDER — STERILE WATER FOR INJECTION IV SOLN
INTRAVENOUS | Status: DC
  Filled 2021-02-26 (×11): qty 1000

## 2021-02-26 MED ORDER — POTASSIUM CHLORIDE 10 MEQ/100ML IV SOLN
10.0000 meq | INTRAVENOUS | Status: AC
  Administered 2021-02-26 (×4): 10 meq via INTRAVENOUS
  Filled 2021-02-26 (×4): qty 100

## 2021-02-26 MED ORDER — TRAZODONE HCL 50 MG PO TABS
50.0000 mg | ORAL_TABLET | Freq: Every evening | ORAL | Status: DC | PRN
  Administered 2021-02-26: 50 mg via ORAL
  Filled 2021-02-26 (×2): qty 1

## 2021-02-26 NOTE — Progress Notes (Signed)
  Echocardiogram 2D Echocardiogram has been performed.  Bryan Vargas 02/26/2021, 12:10 PM

## 2021-02-26 NOTE — Progress Notes (Addendum)
PROGRESS NOTE   Bryan Vargas  TDH:[id number] DOB: Sep 16, 1950 DOA: 02/25/2021 PCP: Quentin Cornwall, MD   Chief Complaint  Patient presents with   Dehydration   Level of care: Med-Surg  Brief Admission History:  70 y.o. male, with past medical history of diabetes mellitus, BPH, presents to ED for evaluation of dehydration due to excessive diarrhea, patient had an ED visit 8 days ago, complaining of constipation, where he was started on MiraLAX, he reports he was taking MiraLAX twice daily until this Monday, he has been having multiple loose bowel movements per day, up to 20, does report over last 24 hours he did have some nausea, vomiting, denies any fever, chills, coffee-ground emesis, bright red blood per rectum, report he had screening colonoscopy at age 72, 85, at [facility], and report he was establishing with [facility] in [place], where he is supposed to have another screening colonoscopy next month, he does report history of BPH, for which he is following with at 134, he had white blood cell count of 23, hemoglobin at 16.4, urinalysis was significant for white blood cell count>50, and red blood cell 21-50, CT abdomen significant for markedly distended stomach, and lesions in the pancreatic tail, [facility] hospitalist consulted to admit.  Assessment & Plan:   Principal Problem:   UTI (urinary tract infection) Active Problems:   AKI (acute kidney injury) ([place])   Diabetes mellitus ([place])   Dehydration   Gastric dilatation   GERD (gastroesophageal reflux disease)   Diarrhea   Hypokalemia   Leukocytosis   Metabolic acidosis   AKI - Pt had a creatinine of 1.00 in Feb 2022.  He is likely prerenal given severe recent diarrhea.  He is started on IV fluids, follow BMP.  Renally dose medications as appropriate.   Check Renal US to rule out obstruction/hydronephrosis.   Metabolic acidosis - added sodium bicarbonate to IV fluids.  Recheck BMP in AM.   Hypokalemia - check Mg, IV potassium  supplementation ordered.   UTI - follow urine culture.  Continue ceftriaxone IV.   Nausea and vomiting with gastric dilatation - Could this be related to AKI versus gastroenteritis.  Further work up pending.   Diarrhea - secondary to laxatives which have been discontinued.  C diff was negative.  GI path panel pending.    Gastric dilatation -unknown cause at this time, GI consultation requested.    Pancreatic lesions - Pt reporting that he is receiving close follow by his GI in [place] and reports the lesions have been stable.  Will defer to GI consult if further inpatient imaging is recommended.   Type 2 diabetes mellitus - check A1c, continue levemir and SSI coverage with CBG monitoring.    BPH - stable on proscar therapy.    DVT prophylaxis: SQ heparin  Code Status: full  Family Communication: plan of care discussed with patient at bedside, who verbalized understanding Disposition:  Status is: Inpatient  Remains inpatient appropriate because:IV treatments appropriate due to intensity of illness or inability to take PO and Inpatient level of care appropriate due to severity of illness  Dispo: The patient is from: Home              Anticipated d/c is to: Home              Patient currently is not medically stable to d/c.   Difficult to place patient No   Consultants:  GI   Procedures:  N/a   Antimicrobials:  Ceftriaxone 7/6>>  Subjective: Pt reports that he feels very weak.  He is willing to try to work with PT today.    Objective: Vitals:   02/26/21 0400 02/26/21 0512 02/26/21 0600 02/26/21 0748  BP: 105/75  109/60 123/72  Pulse: 98 98 99 100  Resp: 16 16 16 19   Temp:  98.7 F (37.1 C)  98 F (36.7 C)  TempSrc:  Oral  Oral  SpO2: 98% 96% 97% 100%  Weight:    104.6 kg  Height:    6\' 3"  (1.905 m)    Intake/Output Summary (Last 24 hours) at 02/26/2021 1046 Last data filed at 02/25/2021 1647 Gross per 24 hour  Intake 1500 ml  Output 60 ml  Net 1440 ml    Filed Weights   02/25/21 1258 02/26/21 0748  Weight: 102.5 kg 104.6 kg    Examination:  General exam: Appears calm and comfortable.  MM are dry.  Respiratory system: Clear to auscultation. Respiratory effort normal. Cardiovascular system: normal S1 & S2 heard. No JVD, murmurs, rubs, gallops or clicks. No pedal edema. Gastrointestinal system: Abdomen is obese, nondistended, soft and nontender. No organomegaly or masses felt. Normal bowel sounds heard. Central nervous system: Alert and oriented. No focal neurological deficits. Extremities: Symmetric 5 x 5 power. Skin: No rashes, lesions or ulcers Psychiatry: Judgement and insight appear normal. Mood & affect appropriate.   Data Reviewed: I have personally reviewed following labs and imaging studies  CBC: Recent Labs  Lab 02/25/21 1315 02/26/21 0436  WBC 23.4* 19.6*  NEUTROABS 19.5*  --   HGB 16.4 14.6  HCT 49.4 43.3  MCV 97.2 93.5  PLT 357 128    Basic Metabolic Panel: Recent Labs  Lab 02/25/21 1315 02/26/21 0436  NA 134* 134*  K 3.7 3.3*  CL 107 110  CO2 16* 15*  GLUCOSE 192* 159*  BUN 37* 45*  CREATININE 2.62* 2.24*  CALCIUM 9.6 8.5*  MG 1.9  --     GFR: Estimated Creatinine Clearance: 40.7 mL/min (A) (by C-G formula based on SCr of 2.24 mg/dL (H)).  Liver Function Tests: Recent Labs  Lab 02/25/21 1315  AST 13*  ALT 20  ALKPHOS 86  BILITOT 1.0  PROT 7.3  ALBUMIN 4.0    CBG: No results for input(s): GLUCAP in the last 168 hours.  Recent Results (from the past 240 hour(s))  Resp Panel by RT-PCR (Flu A&B, Covid) Nasopharyngeal Swab     Status: None   Collection Time: 02/25/21  2:54 PM   Specimen: Nasopharyngeal Swab; Nasopharyngeal(NP) swabs in vial transport medium  Result Value Ref Range Status   SARS Coronavirus 2 by RT PCR NEGATIVE NEGATIVE Final    Comment: (NOTE) SARS-CoV-2 target nucleic acids are NOT DETECTED.  The SARS-CoV-2 RNA is generally detectable in upper respiratory specimens  during the acute phase of infection. The lowest concentration of SARS-CoV-2 viral copies this assay can detect is 138 copies/mL. A negative result does not preclude SARS-Cov-2 infection and should not be used as the sole basis for treatment or other patient management decisions. A negative result may occur with  improper specimen collection/handling, submission of specimen other than nasopharyngeal swab, presence of viral mutation(s) within the areas targeted by this assay, and inadequate number of viral copies(<138 copies/mL). A negative result must be combined with clinical observations, patient history, and epidemiological information. The expected result is Negative.  Fact Sheet for Patients:  EntrepreneurPulse.com.au  Fact Sheet for Healthcare Providers:  IncredibleEmployment.be  This test is no t yet  approved or cleared by the [place] and  has been authorized for detection and/or diagnosis of SARS-CoV-2 by FDA under an Emergency Use Authorization (EUA). This EUA will remain  in effect (meaning this test can be used) for the duration of the COVID-19 declaration under Section 564(b)(1) of the Act, 21 U.S.C.section 360bbb-3(b)(1), unless the authorization is terminated  or revoked sooner.       Influenza A by PCR NEGATIVE NEGATIVE Final   Influenza B by PCR NEGATIVE NEGATIVE Final    Comment: (NOTE) The Xpert Xpress SARS-CoV-2/FLU/RSV plus assay is intended as an aid in the diagnosis of influenza from Nasopharyngeal swab specimens and should not be used as a sole basis for treatment. Nasal washings and aspirates are unacceptable for Xpert Xpress SARS-CoV-2/FLU/RSV testing.  Fact Sheet for Patients: EntrepreneurPulse.com.au  Fact Sheet for Healthcare Providers: IncredibleEmployment.be  This test is not yet approved or cleared by the [place] FDA and has been authorized for detection  and/or diagnosis of SARS-CoV-2 by FDA under an Emergency Use Authorization (EUA). This EUA will remain in effect (meaning this test can be used) for the duration of the COVID-19 declaration under Section 564(b)(1) of the Act, 21 U.S.C. section 360bbb-3(b)(1), unless the authorization is terminated or revoked.  Performed at [facility], [address] [place], [place] [address]   C Difficile Quick Screen w PCR reflex     Status: None   Collection Time: 02/25/21  4:31 PM   Specimen: STOOL  Result Value Ref Range Status   C Diff antigen NEGATIVE NEGATIVE Final   C Diff toxin NEGATIVE NEGATIVE Final   C Diff interpretation No C. difficile detected.  Final    Comment: Performed at [facility], [address] [place], [place] [address]     Radiology Studies: CT Abdomen Pelvis Wo Contrast  Result Date: 02/25/2021 CLINICAL DATA:  Diarrhea for 4 days. Leukocytosis. Acute kidney injury. EXAM: CT ABDOMEN AND PELVIS WITHOUT CONTRAST TECHNIQUE: Multidetector CT imaging of the abdomen and pelvis was performed following the standard protocol without IV contrast. COMPARISON:  09/30/2020 FINDINGS: Lower chest: No acute findings. Hepatobiliary: No mass visualized on this unenhanced exam. Gallbladder is distended but otherwise unremarkable in appearance. No evidence of biliary ductal dilatation. Pancreas: 2 adjacent lesions are seen in the pancreatic tail, largest measuring 2.5 cm, which cannot be characterized on this unenhanced exam. No evidence of peripancreatic inflammatory changes. Spleen:  Within normal limits in size. Adrenals/Urinary tract: Severe diffuse right renal atrophy is seen. Multiple left renal cysts are stable including a few hemorrhagic cysts. No evidence of ureteral calculi or hydronephrosis. Diffuse bladder wall thickening is again seen, consistent with chronic bladder outlet obstruction. A few tiny less than 5 mm calculi are noted in the urinary bladder. Stomach/Bowel: Stomach is distended,  but small and large bowel are nondilated. No evidence of obstruction, inflammatory process, or abnormal fluid collections. Vascular/Lymphatic: No pathologically enlarged lymph nodes identified. No evidence of abdominal aortic aneurysm. Reproductive:  Stable markedly enlarged prostate. Other:  None. Musculoskeletal:  No suspicious bone lesions identified. IMPRESSION: Markedly distended stomach. Gastroparesis or gastric outlet obstruction cannot be excluded. Stable markedly enlarged prostate and findings of chronic bladder outlet obstruction. Tiny less than 5 mm urinary bladder calculi. No evidence of ureteral calculi or hydronephrosis. 2 adjacent lesions in pancreatic tail, largest measuring 2.5 cm, which cannot be characterized on this unenhanced exam. Pancreas protocol abdomen MRI or CT without and with contrast recommended for further characterization. Electronically Signed   By:  Marlaine Hind M.D.   On: 02/25/2021 16:52    Scheduled Meds:  aspirin EC  81 mg Oral Daily   finasteride  5 mg Oral Daily   heparin  5,000 Units Subcutaneous Q8H   insulin detemir  10 Units Subcutaneous Daily   latanoprost  1 drop Both Eyes Daily   ondansetron  4 mg Intravenous Once   pantoprazole  40 mg Oral Daily   Continuous Infusions:  cefTRIAXone (ROCEPHIN)  IV     potassium chloride 10 mEq (02/26/21 0932)    sodium bicarbonate (isotonic) infusion in sterile water       LOS: 1 day   Time spent: 37 mins   Vaness Jelinski Wynetta Emery, MD How to contact the [facility] Attending or Consulting provider [place] or covering provider during after hours [place], for this patient?  Check the care team in [facility] and look for a) attending/consulting [facility] provider listed and b) the [facility] team listed Log into [email or web address] and use [place] universal password to access. If you do not have the password, please contact the hospital operator. Locate the [facility] provider you are looking for under [facility] and page to a number that you can be  directly reached. If you still have difficulty reaching the provider, please page the [facility] (Director on Call) for the Hospitalists listed on amion for assistance.  02/26/2021, 10:46 AM

## 2021-02-26 NOTE — ED Notes (Signed)
Secure messaged Dr. Olevia Bowens for medicines, one to help pt sleep and another to help with his diarrhea. Orders placed for 50mg  traZODone oral, no medicine for diarrhea at this time.

## 2021-02-26 NOTE — Evaluation (Addendum)
Physical Therapy Evaluation Patient Details Name: Bryan Vargas MRN: [id number] DOB: Oct 08, 1950 Today's Date: 02/26/2021   History of Present Illness  Ilan Kahrs  is a 70 y.o. male, with past medical history of diabetes mellitus, BPH, presents to ED for evaluation of dehydration due to excessive diarrhea, patient had an ED visit 8 days ago, complaining of constipation, where he was started on MiraLAX, he reports he was taking MiraLAX twice daily until this Monday, he has been having multiple loose bowel movements per day, up to 20, does report over last 24 hours he did have some nausea, vomiting, denies any fever, chills, coffee-ground emesis, bright red blood per rectum, report he had screening colonoscopy at age 70, 63, at [facility], and report he was establishing with [facility] in [place], where he is supposed to have another screening colonoscopy next month, he does report history of BPH, for which he is following with at 134, he had white blood cell count of 23, hemoglobin at 16.4, urinalysis was significant for white blood cell count>50, and red blood cell 21-50, CT abdomen significant for markedly distended stomach, and lesions in the pancreatic tail, [facility] hospitalist consulted to admit.   Clinical Impression  Patient presents in bed and agreeable to therapy, patient is independent for bed mobility and transfers. 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 Patient discharged from physical therapy to care of nursing for ambulation daily as tolerated for length of stay.     Follow Up Recommendations No PT follow up    Equipment Recommendations  None recommended by PT    Recommendations for Other Services       Precautions / Restrictions Precautions Precautions: None Restrictions Weight Bearing Restrictions:  No      Mobility  Bed Mobility Overal bed mobility: Independent               Patient Response: Cooperative  Transfers Overall transfer level: Independent Equipment used: None                Ambulation/Gait Ambulation/Gait assistance: Independent Gait Distance (Feet): 200 Feet Assistive device: None Gait Pattern/deviations: WFL(Within Functional Limits) Gait velocity: normal      Stairs            Wheelchair Mobility    Modified Rankin (Stroke Patients Only)       Balance Overall balance assessment: Independent                                           Pertinent Vitals/Pain Pain Assessment: Faces Faces Pain Scale: Hurts a little bit Pain Location: stomach and left knee (reports its from OA) Pain Descriptors / Indicators: Sore;Tightness Pain Intervention(s): Monitored during session;Limited activity within patient's tolerance    Home Living Family/patient expects to be discharged to:: Private residence Living Arrangements: Spouse/significant other Available Help at Discharge: Family;Available 24 hours/day Type of Home: House Home Access: Stairs to enter Entrance Stairs-Rails: None Entrance Stairs-Number of Steps: 1 Home Layout: One level;Laundry or work area in basement;Able to live on main level with bedroom/bathroom Home Equipment: Environmental consultant - 4 wheels;Cane - single point;Shower seat;Grab bars - tub/shower      Prior Function Level of Independence: Independent  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        Extremity/Trunk Assessment   Upper Extremity Assessment Upper Extremity Assessment: Overall WFL for tasks assessed    Lower Extremity Assessment Lower Extremity Assessment: Overall WFL for tasks assessed    Cervical / Trunk Assessment Cervical / Trunk Assessment: Normal  Communication   Communication: No difficulties  Cognition Arousal/Alertness: Awake/alert Behavior  During Therapy: WFL for tasks assessed/performed Overall Cognitive Status: Within Functional Limits for tasks assessed                                        General Comments      Exercises     Assessment/Plan    PT Assessment Patent does not need any further PT services  PT Problem List         PT Treatment Interventions      PT Goals (Current goals can be found in the Care Plan section)  Acute Rehab PT Goals Patient Stated Goal: return home PT Goal Formulation: With patient/family Time For Goal Achievement: 03/05/21 Potential to Achieve Goals: Good    Frequency     Barriers to discharge        Co-evaluation               AM-PAC PT "6 Clicks" Mobility  Outcome Measure Help needed turning from your back to your side while in a flat bed without using bedrails?: None Help needed moving from lying on your back to sitting on the side of a flat bed without using bedrails?: None Help needed moving to and from a bed to a chair (including a wheelchair)?: None Help needed standing up from a chair using your arms (e.g., wheelchair or bedside chair)?: None Help needed to walk in hospital room?: None Help needed climbing 3-5 steps with a railing? : None 6 Click Score: 24    End of Session   Activity Tolerance: Patient tolerated treatment well;No increased pain Patient left: in bed;with call bell/phone within reach;with family/visitor present Nurse Communication: Mobility status PT Visit Diagnosis: Unsteadiness on feet (R26.81);Other abnormalities of gait and mobility (R26.89);Muscle weakness (generalized) (M62.81)    Time: 2979-8921 PT Time Calculation (min) (ACUTE ONLY): 26 min   Charges:   PT Evaluation $PT Eval Moderate Complexity: 1 Mod PT Treatments $Therapeutic Activity: 23-37 mins       3:44 PM, 02/26/21 Sinclair Ship SPT  3:44 PM, 02/26/21 Lonell Grandchild, MPT Physical Therapist with [facility] 336 [phone]  office [phone] mobile phone

## 2021-02-26 NOTE — Progress Notes (Signed)
[facility] night shift MedSurg coverage note.  The patient requested to the nursing staff something to help him sleep.  Trazodone 50 mg p.o. nightly as needed ordered.  He also asked for an antidiarrheal.  However, Dr. Waldron Labs describes abdominal distention so I will not grant his request to avoid risk of development of ileus or bowel obstruction.  Tennis Must, MD.

## 2021-02-26 NOTE — Consult Note (Signed)
Referring Provider: Murlean Iba, MD Primary Care Physician:  Quentin Cornwall, MD Primary Gastroenterologist:  previously [facility] but pending evaluation by [facility].   Reason for Consultation: Distended stomach  HPI: Bryan Vargas is a 70 y.o. male with h/o DM, BPH, history of kidney stones, h/o chronic pancreatic lesions followed by his PCP, presents due to excessive diarrhea, concern for dehydration. Had been seen in ED 8 days prior with complaints of constipation and nausea and started on Miralax BID and Reglan.   Patient states he started on Miralax once daily last Wednesday, took twice on day. He started having loose stools up to 20 per day. He has since stopped the miralax. He also noted several episodes of N/V, malodorous belches. Has vomiting food from several days before. Having new onset heartburn.  Prior to few weeks ago, he had no GI complaints. No problems with bowels or vomiting. He has had intentional of 30 pounds. Started insulin in 09/2020 but no other new medication changes. No ill contacts. No fever. Has had pre-diabetes for several years. Denies regular NSAID use. Takes ASA 81mg  daily. No prior EGD. Day prior to presentation also noted N/V.  Previously followed by Dr. Algis Greenhouse in [place] prior to his retirement. Scheduled to see Dr. Havery Moros at [facility] to schedule colonoscopy next week. This was arranged prior to onset of symptoms. Wife has been to [place] and he didn't want to go back to [facility] for GI care.   In ED: Sodium 134, potassium 3.7, BUN 37, creatinine 2.62 up from baseline of 1.0, white blood cell count 23,400, hemoglobin 16.4, platelets 357,000, SARS coronavirus 2 negative, lactic acid 1.0, Cdiff negative.  GI pathogen panel in process.  Blood cultures pending.  Today's white blood cell count is down to 19,600.  Potassium 3.3, sodium 134, creatinine 2.24.  Abdominal x-ray June 28 with mild to moderate stool in the right colon, moderate gastric  distention.  CT abdomen pelvis without contrast yesterday showed 2 adjacent lesions seen in the pancreatic tail, largest measuring 2.5 cm, cannot be characterized on unenhanced exam (pancreatic protocol abdominal MRI or CT with and without contrast recommended), severe diffuse right renal atrophy, diffuse bladder wall thickening again seen, consistent with chronic bladder outlet obstruction, stomach distended small and large bowel nondilated,   Prior to Admission medications   Medication Sig Start Date End Date Taking? Authorizing Provider  aspirin EC 81 MG tablet Take 81 mg by mouth daily. Swallow whole.   Yes [provider]  finasteride (PROSCAR) 5 MG tablet Take 1 tablet (5 mg total) by mouth daily. 10/16/20  Yes Irine Seal, MD  JANUVIA 100 MG tablet Take 100 mg by mouth daily. 07/09/20  Yes [provider]  latanoprost (XALATAN) 0.005 % ophthalmic solution Place 1 drop into both eyes daily. 07/03/20  Yes [provider]  LEVEMIR FLEXTOUCH 100 UNIT/ML FlexPen SMARTSIG:20 Unit(s) SUB-Q Daily 10/06/20  Yes [provider]  metoCLOPramide (REGLAN) 10 MG tablet Take 1 tablet (10 mg total) by mouth every 8 (eight) hours as needed for nausea. 02/17/21  Yes Rayna Sexton, PA-C  Multiple Vitamins-Minerals (CENTRUM ADULTS PO) Take 1 tablet by mouth daily.   Yes [provider]  omeprazole (PRILOSEC) 40 MG capsule Take 40 mg by mouth daily. 02/11/21  Yes [provider]  azelastine (ASTELIN) 0.1 % nasal spray Place into both nostrils. Patient not taking: No sig reported 07/14/20   [provider]    Current Facility-Administered Medications  Medication Dose  Route Frequency Provider Last Rate Last Admin   acetaminophen (TYLENOL) tablet 650 mg  650 mg Oral Q6H PRN Elgergawy, Silver Huguenin, MD   650 mg at 02/26/21 0230   Or   acetaminophen (TYLENOL) suppository 650 mg  650 mg Rectal Q6H PRN Elgergawy, Silver Huguenin, MD       aspirin EC tablet 81 mg   81 mg Oral Daily Elgergawy, Silver Huguenin, MD   81 mg at 02/26/21 2841   cefTRIAXone (ROCEPHIN) 1 g in sodium chloride 0.9 % 100 mL IVPB  1 g Intravenous Q24H Elgergawy, Silver Huguenin, MD       finasteride (PROSCAR) tablet 5 mg  5 mg Oral Daily Elgergawy, Silver Huguenin, MD   5 mg at 02/26/21 3244   heparin injection 5,000 Units  5,000 Units Subcutaneous Q8H Elgergawy, Silver Huguenin, MD       insulin detemir (LEVEMIR) injection 10 Units  10 Units Subcutaneous Daily Elgergawy, Silver Huguenin, MD   10 Units at 02/26/21 1344   latanoprost (XALATAN) 0.005 % ophthalmic solution 1 drop  1 drop Both Eyes Daily Elgergawy, Silver Huguenin, MD   1 drop at 02/26/21 0928   metoCLOPramide (REGLAN) injection 5 mg  5 mg Intravenous Q6H PRN Elgergawy, Silver Huguenin, MD   5 mg at 02/26/21 0209   ondansetron (ZOFRAN) injection 4 mg  4 mg Intravenous Once Elgergawy, Silver Huguenin, MD       pantoprazole (PROTONIX) EC tablet 40 mg  40 mg Oral Daily Elgergawy, Silver Huguenin, MD   40 mg at 02/26/21 0102   potassium chloride 10 mEq in 100 mL IVPB  10 mEq Intravenous Q1 Hr x 4 Johnson, Clanford L, MD 100 mL/hr at 02/26/21 1339 10 mEq at 02/26/21 1339   sodium bicarbonate 150 mEq in sterile water 1,150 mL infusion   Intravenous Continuous Wynetta Emery, Clanford L, MD 125 mL/hr at 02/26/21 1342 New Bag at 02/26/21 1342   traZODone (DESYREL) tablet 50 mg  50 mg Oral QHS PRN Reubin Milan, MD   50 mg at 02/26/21 0231    Allergies as of 02/25/2021   (No Known Allergies)    Past Medical History:  Diagnosis Date   Arthritis    Coronary artery calcification seen on CAT scan 09/2020   Diabetes ([place])    Gout    Hepatitis    at age 85    Past Surgical History:  Procedure Laterality Date   arthritis     CYSTOSCOPY W/ URETERAL STENT PLACEMENT     EXTRACORPOREAL SHOCK WAVE LITHOTRIPSY     EXTRACORPOREAL SHOCK WAVE LITHOTRIPSY     PROSTATE BIOPSY     TONSILLECTOMY      Family History  Problem Relation Age of Onset   Cancer Brother        patient can't remember  what kind   Colon cancer Neg Hx    Celiac disease Neg Hx    Inflammatory bowel disease Neg Hx     Social History   Socioeconomic History   Marital status: Single    Spouse name: Not on file   Number of children: 2   Years of education: Not on file   Highest education level: Not on file  Occupational History   Occupation: retired  Tobacco Use   Smoking status: Never   Smokeless tobacco: Never  Substance and Sexual Activity   Alcohol use: Yes   Drug use: Not on file   Sexual activity: Not on file  Other Topics Concern  Not on file  Social History Narrative   Not on file   Social Determinants of Health   Financial Resource Strain: Not on file  Food Insecurity: Not on file  Transportation Needs: Not on file  Physical Activity: Not on file  Stress: Not on file  Social Connections: Not on file  Intimate Partner Violence: Not on file     ROS:  General: Negative for anorexia, unintentional weight loss, fever, chills, fatigue, +weakness. Eyes: Negative for vision changes.  ENT: Negative for hoarseness, difficulty swallowing , nasal congestion. CV: Negative for chest pain, angina, palpitations, dyspnea on exertion, peripheral edema.  Respiratory: Negative for dyspnea at rest, dyspnea on exertion, cough, sputum, wheezing.  GI: See history of present illness. GU:  Negative for dysuria, hematuria, urinary incontinence, urinary frequency, nocturnal urination.  MS: Negative for low back pain. +knee pain, chronic Derm: Negative for rash or itching.  Neuro: Negative for weakness, abnormal sensation, seizure, frequent headaches, memory loss, confusion.  Psych: Negative for anxiety, depression, suicidal ideation, hallucinations.  Endo: Negative for unusual weight change.  Heme: Negative for bruising or bleeding. Allergy: Negative for rash or hives.       Physical Examination: Vital signs in last 24 hours: Temp:  [98 F (36.7 C)-98.8 F (37.1 C)] 98.8 F (37.1 C) (07/07  1203) Pulse Rate:  [89-103] 93 (07/07 1203) Resp:  [14-20] 19 (07/07 1203) BP: (102-123)/(60-95) 102/74 (07/07 1203) SpO2:  [96 %-100 %] 100 % (07/07 1203) Weight:  [104.6 kg] 104.6 kg (07/07 0748)    General: Well-nourished, well-developed in no acute distress. Resting. Significant other at bedside. Head: Normocephalic, atraumatic.   Eyes: Conjunctiva pink, no icterus. Mouth: Oropharyngeal mucosa moist and pink , no lesions erythema or exudate. Neck: Supple without thyromegaly, masses, or lymphadenopathy.  Lungs: Clear to auscultation bilaterally.  Heart: Regular rate and rhythm, no murmurs rubs or gallops.  Abdomen: Bowel sounds are somewhat tympanic in upper abdomina and hyperactive, nontender, nondistended, no hepatosplenomegaly or masses, no abdominal bruits or    hernia , no rebound or guarding.   Rectal: not performed Extremities: No lower extremity edema, clubbing, deformity.  Neuro: Alert and oriented x 4 , grossly normal neurologically.  Skin: Warm and dry, no rash or jaundice.   Psych: Alert and cooperative, normal mood and affect.        Intake/Output from previous day: 07/06 0701 - 07/07 0700 In: 1500 [IV Piggyback:1500] Out: 60 [Urine:60] Intake/Output this shift: No intake/output data recorded.  Lab Results: CBC Recent Labs    02/25/21 1315 02/26/21 0436  WBC 23.4* 19.6*  HGB 16.4 14.6  HCT 49.4 43.3  MCV 97.2 93.5  PLT 357 262   BMET Recent Labs    02/25/21 1315 02/26/21 0436  NA 134* 134*  K 3.7 3.3*  CL 107 110  CO2 16* 15*  GLUCOSE 192* 159*  BUN 37* 45*  CREATININE 2.62* 2.24*  CALCIUM 9.6 8.5*   LFT Recent Labs    02/25/21 1315  BILITOT 1.0  ALKPHOS 86  AST 13*  ALT 20  PROT 7.3  ALBUMIN 4.0    Lipase No results for input(s): LIPASE in the last 72 hours.  PT/INR Recent Labs    02/26/21 0436  LABPROT 14.9  INR 1.2      Imaging Studies: CT Abdomen Pelvis Wo Contrast  Result Date: 02/25/2021 CLINICAL DATA:  Diarrhea  for 4 days. Leukocytosis. Acute kidney injury. EXAM: CT ABDOMEN AND PELVIS WITHOUT CONTRAST TECHNIQUE: Multidetector CT imaging of the  abdomen and pelvis was performed following the standard protocol without IV contrast. COMPARISON:  09/30/2020 FINDINGS: Lower chest: No acute findings. Hepatobiliary: No mass visualized on this unenhanced exam. Gallbladder is distended but otherwise unremarkable in appearance. No evidence of biliary ductal dilatation. Pancreas: 2 adjacent lesions are seen in the pancreatic tail, largest measuring 2.5 cm, which cannot be characterized on this unenhanced exam. No evidence of peripancreatic inflammatory changes. Spleen:  Within normal limits in size. Adrenals/Urinary tract: Severe diffuse right renal atrophy is seen. Multiple left renal cysts are stable including a few hemorrhagic cysts. No evidence of ureteral calculi or hydronephrosis. Diffuse bladder wall thickening is again seen, consistent with chronic bladder outlet obstruction. A few tiny less than 5 mm calculi are noted in the urinary bladder. Stomach/Bowel: Stomach is distended, but small and large bowel are nondilated. No evidence of obstruction, inflammatory process, or abnormal fluid collections. Vascular/Lymphatic: No pathologically enlarged lymph nodes identified. No evidence of abdominal aortic aneurysm. Reproductive:  Stable markedly enlarged prostate. Other:  None. Musculoskeletal:  No suspicious bone lesions identified. IMPRESSION: Markedly distended stomach. Gastroparesis or gastric outlet obstruction cannot be excluded. Stable markedly enlarged prostate and findings of chronic bladder outlet obstruction. Tiny less than 5 mm urinary bladder calculi. No evidence of ureteral calculi or hydronephrosis. 2 adjacent lesions in pancreatic tail, largest measuring 2.5 cm, which cannot be characterized on this unenhanced exam. Pancreas protocol abdomen MRI or CT without and with contrast recommended for further  characterization. Electronically Signed   By: Marlaine Hind M.D.   On: 02/25/2021 16:52   DG Abdomen Acute W/Chest  Result Date: 02/17/2021 CLINICAL DATA:  Abdomen pain constipation EXAM: DG ABDOMEN ACUTE WITH 1 VIEW CHEST COMPARISON:  CT 09/30/2020 FINDINGS: Single-view chest demonstrates no focal opacity or pleural effusion. Normal cardiomediastinal silhouette. No pneumothorax. Supine and upright views of the abdomen demonstrate no free air beneath the diaphragm. Moderate gastric distension. Nonspecific decreased small bowel gas. Mild to moderate stool in the right colon. No radiopaque calculi over the kidneys. Coarse calcifications in the region of the prostate gland IMPRESSION: 1. No radiographic evidence for acute cardiopulmonary abnormality. 2. Nonobstructed gas pattern with overall decreased small bowel gas. Mild to moderate stool in the right colon 3. Moderate gastric distension Electronically Signed   By: Donavan Foil M.D.   On: 02/17/2021 15:14  [4 week]   Impression: 70 y/o male presenting with profuse diarrhea, N/V, and concern for dehydration. Recent onset GI issues. Seen in ED 6/28 with complaints of new nausea and constipation. Abdominal xray showed mild to moderate stool load in right colon and gastric distention. Now presenting with significant diarrhea, vomiting.   Diarrhea: in setting of miralax, but not typical response to low dose treatment. There have been no ill contacts, recent travels. New new medication changes. Suspect acute gastroenteritis as cause of his acute diarrhea. Cdiff is negative. WBC elevated with slight improvement today. GI path panel pending. Today he has had only 2 stools since in the last four hours. States only 5-6 in the last 24 hours. Seems to be improved.   N/V: recent onset. Complains of early satiety, vomiting old food from days prior. All new symptoms. History of prediabetes for years but only more recently on medication. Newest addition was insulin in  09/2020. Cannot rule out post-infectious gastroparesis, diabetic gastroparesis, or gastric outlet obstruction as cause of gastric distention seen on imaging. New onset of heartburn likely due to acute illness.   Pancreatic tail lesions: per patient has had  for several years and PCP has been following every year with CT. No urgent needs for inpatient evaluation of pancreatic lesions.   Plan: Anticipate EGD this admission to evaluate gastric distention, N/V, early satiety.  He may require colonoscopy this admission based on clinical course, but given ongoing vomiting he could be a difficult prep. Would consider waiting for stool studies to rule out infectious etiology for his diarrhea.  F/u GI panel. Follow WBC, renal function.  We would like to thank you for the opportunity to participate in the care of Jerrald Doverspike.  Laureen Ochs. Bernarda Caffey [facility] [phone] 7/7/20222:40 PM    LOS: 1 day

## 2021-02-27 ENCOUNTER — Inpatient Hospital Stay (HOSPITAL_COMMUNITY): Payer: Medicare Other | Admitting: Anesthesiology

## 2021-02-27 ENCOUNTER — Encounter (HOSPITAL_COMMUNITY): Payer: Self-pay | Admitting: Internal Medicine

## 2021-02-27 ENCOUNTER — Encounter (HOSPITAL_COMMUNITY)
Admission: EM | Disposition: A | Discharge status: Home / Self Care | Payer: Self-pay | Source: Home / Self Care | Attending: Family Medicine

## 2021-02-27 DIAGNOSIS — T183XXA Foreign body in small intestine, initial encounter: Secondary | ICD-10-CM

## 2021-02-27 DIAGNOSIS — R933 Abnormal findings on diagnostic imaging of other parts of digestive tract: Secondary | ICD-10-CM

## 2021-02-27 DIAGNOSIS — R1110 Vomiting, unspecified: Secondary | ICD-10-CM | POA: Diagnosis present

## 2021-02-27 DIAGNOSIS — R112 Nausea with vomiting, unspecified: Secondary | ICD-10-CM

## 2021-02-27 DIAGNOSIS — K2980 Duodenitis without bleeding: Secondary | ICD-10-CM

## 2021-02-27 DIAGNOSIS — K2289 Other specified disease of esophagus: Secondary | ICD-10-CM

## 2021-02-27 DIAGNOSIS — K259 Gastric ulcer, unspecified as acute or chronic, without hemorrhage or perforation: Secondary | ICD-10-CM

## 2021-02-27 DIAGNOSIS — K315 Obstruction of duodenum: Secondary | ICD-10-CM

## 2021-02-27 DIAGNOSIS — K222 Esophageal obstruction: Secondary | ICD-10-CM

## 2021-02-27 DIAGNOSIS — K209 Esophagitis, unspecified without bleeding: Secondary | ICD-10-CM

## 2021-02-27 HISTORY — PX: ESOPHAGOGASTRODUODENOSCOPY (EGD) WITH PROPOFOL: SHX5813

## 2021-02-27 HISTORY — PX: BIOPSY: SHX5522

## 2021-02-27 LAB — CBC WITH DIFFERENTIAL/PLATELET
Abs Immature Granulocytes: 0.15 10*3/uL — ABNORMAL HIGH (ref 0.00–0.07)
Basophils Absolute: 0 10*3/uL (ref 0.0–0.1)
Basophils Relative: 0 %
Eosinophils Absolute: 0.2 10*3/uL (ref 0.0–0.5)
Eosinophils Relative: 1 %
HCT: 43.3 % (ref 39.0–52.0)
Hemoglobin: 14.6 g/dL (ref 13.0–17.0)
Immature Granulocytes: 1 %
Lymphocytes Relative: 13 %
Lymphs Abs: 1.8 10*3/uL (ref 0.7–4.0)
MCH: 31.3 pg (ref 26.0–34.0)
MCHC: 33.7 g/dL (ref 30.0–36.0)
MCV: 92.9 fL (ref 80.0–100.0)
Monocytes Absolute: 1.6 10*3/uL — ABNORMAL HIGH (ref 0.1–1.0)
Monocytes Relative: 13 %
Neutro Abs: 9.4 10*3/uL — ABNORMAL HIGH (ref 1.7–7.7)
Neutrophils Relative %: 72 %
Platelets: 280 10*3/uL (ref 150–400)
RBC: 4.66 MIL/uL (ref 4.22–5.81)
RDW: 13.7 % (ref 11.5–15.5)
WBC: 13.2 10*3/uL — ABNORMAL HIGH (ref 4.0–10.5)
nRBC: 0 % (ref 0.0–0.2)

## 2021-02-27 LAB — BASIC METABOLIC PANEL
Anion gap: 7 (ref 5–15)
BUN: 49 mg/dL — ABNORMAL HIGH (ref 8–23)
CO2: 19 mmol/L — ABNORMAL LOW (ref 22–32)
Calcium: 7.8 mg/dL — ABNORMAL LOW (ref 8.9–10.3)
Chloride: 110 mmol/L (ref 98–111)
Creatinine, Ser: 1.82 mg/dL — ABNORMAL HIGH (ref 0.61–1.24)
GFR, Estimated: 40 mL/min — ABNORMAL LOW (ref >60)
Glucose, Bld: 103 mg/dL — ABNORMAL HIGH (ref 70–99)
Potassium: 3.2 mmol/L — ABNORMAL LOW (ref 3.5–5.1)
Sodium: 136 mmol/L (ref 135–145)

## 2021-02-27 LAB — URINE CULTURE: Culture: NO GROWTH

## 2021-02-27 LAB — HEMOGLOBIN A1C
Hgb A1c MFr Bld: 5.8 % — ABNORMAL HIGH (ref 4.8–5.6)
Mean Plasma Glucose: 119.76 mg/dL

## 2021-02-27 LAB — GLUCOSE, CAPILLARY
Glucose-Capillary: 112 mg/dL — ABNORMAL HIGH (ref 70–99)
Glucose-Capillary: 84 mg/dL (ref 70–99)
Glucose-Capillary: 99 mg/dL (ref 70–99)

## 2021-02-27 LAB — TSH: TSH: 2.405 u[IU]/mL (ref 0.350–4.500)

## 2021-02-27 LAB — MAGNESIUM: Magnesium: 1.5 mg/dL — ABNORMAL LOW (ref 1.7–2.4)

## 2021-02-27 SURGERY — ESOPHAGOGASTRODUODENOSCOPY (EGD) WITH PROPOFOL
Anesthesia: General

## 2021-02-27 MED ORDER — FENTANYL CITRATE (PF) 100 MCG/2ML IJ SOLN
INTRAMUSCULAR | Status: AC
  Filled 2021-02-27: qty 2

## 2021-02-27 MED ORDER — INSULIN ASPART 100 UNIT/ML IJ SOLN: 0.0000 [IU] | Freq: Three times a day (TID) | INTRAMUSCULAR | Status: DC

## 2021-02-27 MED ORDER — SUCCINYLCHOLINE CHLORIDE 200 MG/10ML IV SOSY
PREFILLED_SYRINGE | INTRAVENOUS | Status: DC | PRN
  Administered 2021-02-27: 120 mg via INTRAVENOUS

## 2021-02-27 MED ORDER — PANTOPRAZOLE SODIUM 40 MG IV SOLR
40.0000 mg | Freq: Two times a day (BID) | INTRAVENOUS | Status: DC
  Administered 2021-02-27 – 2021-03-04 (×9): 40 mg via INTRAVENOUS
  Filled 2021-02-27 (×11): qty 40

## 2021-02-27 MED ORDER — HEPARIN SODIUM (PORCINE) 5000 UNIT/ML IJ SOLN: 5000.0000 [IU] | Freq: Three times a day (TID) | INTRAMUSCULAR | Status: DC

## 2021-02-27 MED ORDER — PROPOFOL 10 MG/ML IV BOLUS
INTRAVENOUS | Status: DC | PRN
  Administered 2021-02-27: 150 mg via INTRAVENOUS

## 2021-02-27 MED ORDER — LACTATED RINGERS IV BOLUS
500.0000 mL | Freq: Once | INTRAVENOUS | Status: AC
  Administered 2021-02-27: 500 mL via INTRAVENOUS

## 2021-02-27 MED ORDER — ONDANSETRON HCL 4 MG/2ML IJ SOLN
INTRAMUSCULAR | Status: AC
  Filled 2021-02-27: qty 2

## 2021-02-27 MED ORDER — SODIUM CHLORIDE 0.9 % IV SOLN
1.0000 g | INTRAVENOUS | Status: AC
  Administered 2021-02-27: 1 g via INTRAVENOUS
  Filled 2021-02-27 (×2): qty 10

## 2021-02-27 MED ORDER — ONDANSETRON HCL 4 MG/2ML IJ SOLN: 4.0000 mg | Freq: Once | INTRAMUSCULAR | Status: DC | PRN

## 2021-02-27 MED ORDER — EPHEDRINE 5 MG/ML INJ
INTRAVENOUS | Status: AC
  Filled 2021-02-27: qty 10

## 2021-02-27 MED ORDER — PHENYLEPHRINE HCL (PRESSORS) 10 MG/ML IV SOLN
INTRAVENOUS | Status: DC | PRN
  Administered 2021-02-27 (×4): 80 ug via INTRAVENOUS
  Administered 2021-02-27: 40 ug via INTRAVENOUS
  Administered 2021-02-27 (×2): 80 ug via INTRAVENOUS
  Administered 2021-02-27: 120 ug via INTRAVENOUS
  Administered 2021-02-27: 80 ug via INTRAVENOUS

## 2021-02-27 MED ORDER — FENTANYL CITRATE (PF) 100 MCG/2ML IJ SOLN: 25.0000 ug | INTRAMUSCULAR | Status: DC | PRN

## 2021-02-27 MED ORDER — FENTANYL CITRATE (PF) 100 MCG/2ML IJ SOLN
INTRAMUSCULAR | Status: DC | PRN
  Administered 2021-02-27: 50 ug via INTRAVENOUS

## 2021-02-27 MED ORDER — LIDOCAINE HCL (CARDIAC) PF 100 MG/5ML IV SOSY
PREFILLED_SYRINGE | INTRAVENOUS | Status: DC | PRN
  Administered 2021-02-27: 50 mg via INTRATRACHEAL

## 2021-02-27 MED ORDER — POTASSIUM CHLORIDE 10 MEQ/100ML IV SOLN
10.0000 meq | INTRAVENOUS | Status: AC
  Administered 2021-02-27 (×2): 10 meq via INTRAVENOUS

## 2021-02-27 MED ORDER — STERILE WATER FOR IRRIGATION IR SOLN
Status: DC | PRN
  Administered 2021-02-27: 1.5 mL

## 2021-02-27 MED ORDER — MAGNESIUM SULFATE 4 GM/100ML IV SOLN
4.0000 g | Freq: Once | INTRAVENOUS | Status: AC
  Administered 2021-02-27: 4 g via INTRAVENOUS
  Filled 2021-02-27: qty 100

## 2021-02-27 MED ORDER — SODIUM CHLORIDE 0.9 % IV SOLN: INTRAVENOUS | Status: DC

## 2021-02-27 MED ORDER — LACTATED RINGERS IV SOLN: INTRAVENOUS | Status: DC

## 2021-02-27 MED ORDER — POTASSIUM CHLORIDE 10 MEQ/100ML IV SOLN
10.0000 meq | INTRAVENOUS | Status: AC
  Administered 2021-02-27 (×2): 10 meq via INTRAVENOUS
  Filled 2021-02-27 (×2): qty 100

## 2021-02-27 MED ORDER — HEPARIN SODIUM (PORCINE) 5000 UNIT/ML IJ SOLN
5000.0000 [IU] | Freq: Three times a day (TID) | INTRAMUSCULAR | Status: DC
  Administered 2021-02-28 – 2021-03-02 (×7): 5000 [IU] via SUBCUTANEOUS
  Filled 2021-02-27 (×9): qty 1

## 2021-02-27 NOTE — Anesthesia Preprocedure Evaluation (Addendum)
Anesthesia Evaluation  Patient identified by MRN, date of birth, ID band Patient awake    Reviewed: Allergy & Precautions, NPO status , Patient's Chart, lab work & pertinent test results  Airway Mallampati: III  TM Distance: >3 FB Neck ROM: Full  Mouth opening: Limited Mouth Opening  Dental  (+) Dental Advisory Given, Teeth Intact   Pulmonary neg pulmonary ROS,    Pulmonary exam normal breath sounds clear to auscultation       Cardiovascular Exercise Tolerance: Good + CAD  Normal cardiovascular exam Rhythm:Regular Rate:Normal  1. Left ventricular ejection fraction, by estimation, is 55 to 60%. The  left ventricle has normal function. The left ventricle has no regional  wall motion abnormalities. There is mild left ventricular hypertrophy.  Left ventricular diastolic parameters  were normal.  2. Right ventricular systolic function is normal. The right ventricular  size is normal. There is normal pulmonary artery systolic pressure.  3. The mitral valve is normal in structure. Trivial mitral valve  regurgitation.  4. The aortic valve is tricuspid. Aortic valve regurgitation is not  visualized. Mild aortic valve sclerosis is present, with no evidence of  aortic valve stenosis.    Neuro/Psych negative neurological ROS  negative psych ROS   GI/Hepatic GERD (nausea, vomitings)  Poorly Controlled,(+) Hepatitis -  Endo/Other  diabetes, Well Controlled, Type 2, Oral Hypoglycemic Agents  Renal/GU Renal InsufficiencyRenal disease (AKI, right atrophic kidney)     Musculoskeletal  (+) Arthritis ,   Abdominal   Peds  Hematology negative hematology ROS (+)   Anesthesia Other Findings IMPRESSION: Markedly distended stomach. Gastroparesis or gastric outlet obstruction cannot be excluded.  Stable markedly enlarged prostate and findings of chronic bladder outlet obstruction.  Tiny less than 5 mm urinary bladder calculi.  No evidence of ureteral calculi or hydronephrosis.  2 adjacent lesions in pancreatic tail, largest measuring 2.5 cm, which cannot be characterized on this unenhanced exam. Pancreas protocol abdomen MRI or CT without and with contrast recommended for further characterization.   Electronically Signed   By: Marlaine Hind M.D.   On: 02/25/2021 16:52  Reproductive/Obstetrics                          Anesthesia Physical Anesthesia Plan  ASA: 3  Anesthesia Plan: General   Post-op Pain Management:    Induction: Intravenous and Rapid sequence  PONV Risk Score and Plan: Ondansetron  Airway Management Planned: Oral ETT  Additional Equipment:   Intra-op Plan:   Post-operative Plan: Extubation in OR and Possible Post-op intubation/ventilation  Informed Consent: I have reviewed the patients History and Physical, chart, labs and discussed the procedure including the risks, benefits and alternatives for the proposed anesthesia with the patient or authorized representative who has indicated his/her understanding and acceptance.       Plan Discussed with: CRNA and Surgeon  Anesthesia Plan Comments:         Anesthesia Quick Evaluation

## 2021-02-27 NOTE — Anesthesia Procedure Notes (Signed)
Procedure Name: Intubation Date/Time: 02/27/2021 11:38 AM Performed by: Vista Deck, CRNA Pre-anesthesia Checklist: Patient identified, Emergency Drugs available, Suction available, Patient being monitored and Timeout performed Patient Re-evaluated:Patient Re-evaluated prior to induction Oxygen Delivery Method: Circle system utilized Preoxygenation: Pre-oxygenation with 100% oxygen Induction Type: IV induction Laryngoscope Size: Glidescope and 4 Grade View: Grade I Tube type: Oral Tube size: 7.5 mm Number of attempts: 1 Airway Equipment and Method: Video-laryngoscopy and Stylet Placement Confirmation: ETT inserted through vocal cords under direct vision, positive ETCO2 and breath sounds checked- equal and bilateral Secured at: 24 cm Tube secured with: Tape Dental Injury: Teeth and Oropharynx as per pre-operative assessment

## 2021-02-27 NOTE — Op Note (Signed)
[facility] Patient Name: Bryan Vargas Procedure Date: 02/27/2021 11:22 AM MRN: [id number] Date of Birth: Aug 18, 1951 Attending MD: Hildred Laser , MD CSN: [id number] Age: 70 Admit Type: Inpatient Procedure:                Upper GI endoscopy Indications:              Abnormal CT of the GI tract, Nausea with vomiting Providers:                Hildred Laser, MD, Caprice Kluver, Raphael Gibney,                            Technician Referring MD:             Irwin Brakeman, MD Medicines:                General Anesthesia Complications:            No immediate complications. Estimated Blood Loss:     Estimated blood loss was minimal. Procedure:                Pre-Anesthesia Assessment:                           - Prior to the procedure, a History and Physical                            was performed, and patient medications and                            allergies were reviewed. The patient's tolerance of                            previous anesthesia was also reviewed. The risks                            and benefits of the procedure and the sedation                            options and risks were discussed with the patient.                            All questions were answered, and informed consent                            was obtained. Prior Anticoagulants: The patient has                            taken no previous anticoagulant or antiplatelet                            agents except for aspirin. ASA Grade Assessment:                            III - A patient with severe systemic disease. After  reviewing the risks and benefits, the patient was                            deemed in satisfactory condition to undergo the                            procedure.                           After obtaining informed consent, the endoscope was                            passed under direct vision. Throughout the                            procedure, the  patient's blood pressure, pulse, and                            oxygen saturations were monitored continuously. The                            [id number]) was introduced through the mouth,                            and advanced to the duodenal bulb. The upper GI                            endoscopy was accomplished without difficulty. The                            patient tolerated the procedure well. Scope In: 11:43:58 AM Scope Out: 12:08:45 PM Total Procedure Duration: 0 hours 24 minutes 47 seconds  Findings:      The hypopharynx was normal.      The proximal esophagus and mid esophagus were normal.      LA Grade B (one or more mucosal breaks greater than 5 mm, not extending       between the tops of two mucosal folds) esophagitis with no bleeding was       found 40 to 43 cm from the incisors.      The Z-line was irregular and was found 43 cm from the incisors.      One non-bleeding superficial gastric ulcer with no stigmata of bleeding       was found in the gastric body and on the greater curvature of the       stomach. The lesion was twenty-five mm by thirty mm in largest       dimension. Biopsies were taken with a cold forceps for histology. The       pathology specimen was placed into Bottle Number 2.      One non-bleeding superficial gastric ulcer with no stigmata of bleeding       was found in the gastric body, on the lesser curvature of the stomach       and in the gastric antrum. The lesion was large mm in largest dimension.       Biopsies were taken with a cold forceps for histology. The pathology  specimen was placed into Bottle Number 1.      The cardia and pylorus were normal.      Diffuse moderate inflammation characterized by erosions, erythema,       friability and granularity was found in the duodenal bulb.      Food (residue) was found in the duodenal bulb. Removal of food was       accomplished.      An acquired benign-appearing, intrinsic severe  stenosis was found in the       first portion of the duodenum and was non-traversed. Impression:               - Normal hypopharynx.                           - Normal proximal esophagus and mid esophagus.                           - LA Grade B esophagitis with no bleeding.                           - Z-line irregular, 43 cm from the incisors.                           - Non-bleeding gastric ulcer with no stigmata of                            bleeding. Biopsied.                           - Non-bleeding large superficial gastric ulcer with                            no stigmata of bleeding. Biopsied.                           - Normal cardia and pylorus.                           - Duodenitis.                           - Retained food in the duodenum. Removal was                            successful.                           - Acquired duodenal stenosis. The stricture could                            not be traversed with ultraslim scope as well.                           comment: Extensive ulceration involving gastric                            body and antrum of unclear etiology. Moderate  Sedation:      Per Anesthesia Care Recommendation:           - Return patient to hospital ward for ongoing care.                           - Clear liquid diet.                           - Continue present medications.                           - Hold aspirin.                           - Resume heparin tomorrow morning.                           - Change pantoprazole to IV at a dose of 40 mg                            every 12 hours.                           - Will wait for biopsy results. Procedure Code(s):        --- Professional ---                           [phone], Esophagogastroduodenoscopy, flexible,                            transoral; with removal of foreign body(s)                           43239, Esophagogastroduodenoscopy, flexible,                            transoral; with biopsy,  single or multiple Diagnosis Code(s):        --- Professional ---                           K20.90, Esophagitis, unspecified without bleeding                           K22.8, Other specified diseases of esophagus                           K25.9, Gastric ulcer, unspecified as acute or                            chronic, without hemorrhage or perforation                           K29.80, Duodenitis without bleeding                           T18.3XXA, Foreign body in small intestine, initial  encounter                           K31.5, Obstruction of duodenum                           R11.2, Nausea with vomiting, unspecified                           R93.3, Abnormal findings on diagnostic imaging of                            other parts of digestive tract CPT copyright 2019 [employer]. All rights reserved. The codes documented in this report are preliminary and upon coder review may  be revised to meet current compliance requirements. Hildred Laser, MD Hildred Laser, MD 02/27/2021 12:26:31 PM This report has been signed electronically. Number of Addenda: 0

## 2021-02-27 NOTE — Progress Notes (Signed)
Subjective: Frustrated this morning that he hasn't had anything to eat in 3 days and isn't getting any sleep at the hospital. States he wants to go home so he can get some rest. Had chicken broth yesterday and vomited a couple hours later. Vomited corn as well which he reports he ate on Monday. Feels everything he eats is sitting in his lower chest/epigastric area. No nausea/vomiting since yesterday evening. No abdominal pain. Continues with diarrhea. About 6 episodes yesterday, 3 overnight, and one or two episodes this morning. Watery. No brbpr or melena.   Objective: Vital signs in last 24 hours: Temp:  [97.7 F (36.5 C)-98.8 F (37.1 C)] 97.8 F (36.6 C) (07/08 0606) Pulse Rate:  [93-100] 94 (07/08 0606) Resp:  [18-19] 18 (07/08 0606) BP: (99-123)/(67-74) 104/67 (07/08 0606) SpO2:  [98 %-100 %] 98 % (07/08 0606) Weight:  [104.6 kg] 104.6 kg (07/07 0748) Last BM Date: 02/26/21 General:   Alert and oriented, NAD Head:  Normocephalic and atraumatic. Eyes:  No icterus, sclera clear. Conjuctiva pink.  Abdomen:  Bowel sounds present, soft, non-tender, non-distended. No HSM or hernias noted. No rebound or guarding. No masses appreciated  Extremities:  Without edema. Neurologic:  Alert and  oriented x4 Skin:  Warm and dry, intact without significant lesions.  Psych:  Normal mood and affect.  Intake/Output from previous day: 07/07 0701 - 07/08 0700 In: 1602.2 [I.V.:1502.2; IV Piggyback:100] Out: -  Intake/Output this shift: No intake/output data recorded.  Lab Results: Recent Labs    02/25/21 1315 02/26/21 0436 02/27/21 0503  WBC 23.4* 19.6* 13.2*  HGB 16.4 14.6 14.6  HCT 49.4 43.3 43.3  PLT 357 262 280   BMET Recent Labs    02/25/21 1315 02/26/21 0436 02/27/21 0503  NA 134* 134* 136  K 3.7 3.3* 3.2*  CL 107 110 110  CO2 16* 15* 19*  GLUCOSE 192* 159* 103*  BUN 37* 45* 49*  CREATININE 2.62* 2.24* 1.82*  CALCIUM 9.6 8.5* 7.8*   LFT Recent Labs     02/25/21 1315  PROT 7.3  ALBUMIN 4.0  AST 13*  ALT 20  ALKPHOS 86  BILITOT 1.0   PT/INR Recent Labs    02/26/21 0436  LABPROT 14.9  INR 1.2    Studies/Results: CT Abdomen Pelvis Wo Contrast  Result Date: 02/25/2021 CLINICAL DATA:  Diarrhea for 4 days. Leukocytosis. Acute kidney injury. EXAM: CT ABDOMEN AND PELVIS WITHOUT CONTRAST TECHNIQUE: Multidetector CT imaging of the abdomen and pelvis was performed following the standard protocol without IV contrast. COMPARISON:  09/30/2020 FINDINGS: Lower chest: No acute findings. Hepatobiliary: No mass visualized on this unenhanced exam. Gallbladder is distended but otherwise unremarkable in appearance. No evidence of biliary ductal dilatation. Pancreas: 2 adjacent lesions are seen in the pancreatic tail, largest measuring 2.5 cm, which cannot be characterized on this unenhanced exam. No evidence of peripancreatic inflammatory changes. Spleen:  Within normal limits in size. Adrenals/Urinary tract: Severe diffuse right renal atrophy is seen. Multiple left renal cysts are stable including a few hemorrhagic cysts. No evidence of ureteral calculi or hydronephrosis. Diffuse bladder wall thickening is again seen, consistent with chronic bladder outlet obstruction. A few tiny less than 5 mm calculi are noted in the urinary bladder. Stomach/Bowel: Stomach is distended, but small and large bowel are nondilated. No evidence of obstruction, inflammatory process, or abnormal fluid collections. Vascular/Lymphatic: No pathologically enlarged lymph nodes identified. No evidence of abdominal aortic aneurysm. Reproductive:  Stable markedly enlarged prostate. Other:  None. Musculoskeletal:  No suspicious bone lesions identified. IMPRESSION: Markedly distended stomach. Gastroparesis or gastric outlet obstruction cannot be excluded. Stable markedly enlarged prostate and findings of chronic bladder outlet obstruction. Tiny less than 5 mm urinary bladder calculi. No evidence of  ureteral calculi or hydronephrosis. 2 adjacent lesions in pancreatic tail, largest measuring 2.5 cm, which cannot be characterized on this unenhanced exam. Pancreas protocol abdomen MRI or CT without and with contrast recommended for further characterization. Electronically Signed   By: Marlaine Hind M.D.   On: 02/25/2021 16:52   US RENAL  Result Date: 02/26/2021 CLINICAL DATA:  Acute kidney injury, history diabetes mellitus, coronary artery disease EXAM: RENAL / URINARY TRACT ULTRASOUND COMPLETE COMPARISON:  CT abdomen pelvis without contrast 02/25/2021 FINDINGS: Right Kidney: Renal measurements: 5.8 x 3.6 x 2.1 cm = volume: 23 mL. Small and atrophic with thinned echogenic cortex. No mass or hydronephrosis. Left Kidney: Renal measurements: 17.6 x 7.4 x 7.5 cm = volume: 509 mL. Normal cortical thickness. Increased cortical echogenicity. Exophytic cyst at lower kidney 6.2 x 5.6 x 6.1 cm. Additional simple cyst at lower kidney 4.2 x 3.4 x 3.4 cm. Multiple additional smaller cysts are identified. Total of 11 cysts are noted. No hydronephrosis or shadowing calcification. Bladder: Mild bladder wall thickening. Other: Significantly enlarged prostate gland extending into the bladder base, prostate gland measuring 6.6 x 8.9 x 10.5 cm a calculated volume of 323 mL. IMPRESSION: Atrophic RIGHT kidney. Medical renal disease lytic changes LEFT kidney with multiple cysts up to 6.2 cm diameter. Markedly enlarged prostate gland with associated bladder wall thickening which may reflect chronic outlet obstruction. Electronically Signed   By: Lavonia Dana M.D.   On: 02/26/2021 17:16   ECHOCARDIOGRAM COMPLETE  Result Date: 02/26/2021    ECHOCARDIOGRAM REPORT   Patient Name:   Bryan Vargas Date of Exam: 02/26/2021 Medical Rec #:  [id number]    Height:       75.0 in Accession #:    [id number]   Weight:       230.6 lb Date of Birth:  October 01, 1950   BSA:          2.331 m Patient Age:    70 years     BP:           123/72 mmHg Patient  Gender: M            HR:           100 bpm. Exam Location:  [facility] Procedure: 2D Echo, Cardiac Doppler and Color Doppler Indications:    Atrial Fibrillation  History:        Patient has no prior history of Echocardiogram examinations.                 Risk Factors:Diabetes.  Sonographer:    Wenda Low Referring Phys: [id number] [place]  1. Left ventricular ejection fraction, by estimation, is 55 to 60%. The left ventricle has normal function. The left ventricle has no regional wall motion abnormalities. There is mild left ventricular hypertrophy. Left ventricular diastolic parameters were normal.  2. Right ventricular systolic function is normal. The right ventricular size is normal. There is normal pulmonary artery systolic pressure.  3. The mitral valve is normal in structure. Trivial mitral valve regurgitation.  4. The aortic valve is tricuspid. Aortic valve regurgitation is not visualized. Mild aortic valve sclerosis is present, with no evidence of aortic valve stenosis. FINDINGS  Left Ventricle: Left ventricular ejection fraction, by estimation, is 55 to 60%.  The left ventricle has normal function. The left ventricle has no regional wall motion abnormalities. The left ventricular internal cavity size was normal in size. There is  mild left ventricular hypertrophy. Left ventricular diastolic parameters were normal. Right Ventricle: The right ventricular size is normal. Right vetricular wall thickness was not assessed. Right ventricular systolic function is normal. There is normal pulmonary artery systolic pressure. The tricuspid regurgitant velocity is 2.67 m/s, and with an assumed right atrial pressure of 3 mmHg, the estimated right ventricular systolic pressure is 83.6 mmHg. Left Atrium: Left atrial size was normal in size. Right Atrium: Right atrial size was normal in size. Pericardium: There is no evidence of pericardial effusion. Mitral Valve: The mitral valve is normal in  structure. Trivial mitral valve regurgitation. MV peak gradient, 1.8 mmHg. The mean mitral valve gradient is 1.0 mmHg. Tricuspid Valve: The tricuspid valve is normal in structure. Tricuspid valve regurgitation is trivial. Aortic Valve: The aortic valve is tricuspid. Aortic valve regurgitation is not visualized. Mild aortic valve sclerosis is present, with no evidence of aortic valve stenosis. Aortic valve mean gradient measures 2.0 mmHg. Aortic valve peak gradient measures 3.5 mmHg. Aortic valve area, by VTI measures 2.39 cm. Pulmonic Valve: The pulmonic valve was normal in structure. Pulmonic valve regurgitation is not visualized. Aorta: The aortic root is normal in size and structure. IAS/Shunts: No atrial level shunt detected by color flow Doppler.  LEFT VENTRICLE PLAX 2D LVIDd:         3.95 cm  Diastology LVIDs:         2.93 cm  LV e' medial:    6.13 cm/s LV PW:         1.21 cm  LV E/e' medial:  7.0 LV IVS:        1.03 cm  LV e' lateral:   13.60 cm/s LVOT diam:     2.00 cm  LV E/e' lateral: 3.2 LV SV:         47 LV SV Index:   20 LVOT Area:     3.14 cm  RIGHT VENTRICLE RV Basal diam:  3.79 cm RV Mid diam:    2.90 cm LEFT ATRIUM           Index       RIGHT ATRIUM           Index LA diam:      3.20 cm 1.37 cm/m  RA Area:     17.30 cm LA Vol (A2C): 26.3 ml 11.28 ml/m RA Volume:   49.10 ml  21.06 ml/m LA Vol (A4C): 29.0 ml 12.44 ml/m  AORTIC VALVE AV Area (Vmax):    2.64 cm AV Area (Vmean):   2.43 cm AV Area (VTI):     2.39 cm AV Vmax:           93.30 cm/s AV Vmean:          64.700 cm/s AV VTI:            0.197 m AV Peak Grad:      3.5 mmHg AV Mean Grad:      2.0 mmHg LVOT Vmax:         78.30 cm/s LVOT Vmean:        50.000 cm/s LVOT VTI:          0.150 m LVOT/AV VTI ratio: 0.76  AORTA Ao Root diam: 3.40 cm Ao Asc diam:  3.60 cm MITRAL VALVE  TRICUSPID VALVE MV Area (PHT): 3.74 cm    TR Peak grad:   28.5 mmHg MV Area VTI:   3.34 cm    TR Vmax:        267.00 cm/s MV Peak grad:  1.8 mmHg MV  Mean grad:  1.0 mmHg    SHUNTS MV Vmax:       0.68 m/s    Systemic VTI:  0.15 m MV Vmean:      33.0 cm/s   Systemic Diam: 2.00 cm MV Decel Time: 203 msec MV E velocity: 43.20 cm/s MV A velocity: 60.60 cm/s MV E/A ratio:  0.71 Dorris Carnes MD Electronically signed by Dorris Carnes MD Signature Date/Time: 02/26/2021/5:05:56 PM    Final     Assessment: 70 year old male presenting with new onset diarrhea, N/V, and concern for dehydration with AKI and leukocytosis. Previously seen in the emergency room 6/28 with complaints of new nausea and constipation.  Abdominal x-ray at that time with mild to moderate stool load in right colon and gastric distention.  Now with diarrhea and vomiting. No significant history of GI symptoms prior.   Diarrhea: Acute onset in the setting of MiraLAX for constipation, but not a typical response. Last dose of MiraLAX on evening of 7/4. C. difficile and GI pathogen panel negative.  No significant small bowel or colonic findings on noncontrast CT this admission. Diarrhea continues with about 10 Bms over the last 24 hours without abdominal pain, brbpr, or melena. Etiology is unclear. Will check TSH and screen for celiac disease. Would likely benefit from colonoscopy, but not sure if patient would be willing to stay in the hospital another day with clear liquids only as he is very frustrated about this. Will need to discuss with Dr. Laural Golden and discuss with patient again after planned EGD for gastric distension and N/V as per below.   N/V: Recent onset over the last week with associated early satiety, heartburn, and reports of vomiting food from days prior.  All are new symptoms.  History of prediabetes and recently started on medications.  Abdominal x-ray last week with gastric distention. CT A/P this admission with markedly distended stomach.  Trial of chicken broth yesterday, but did not keep this down. Differentials include gastric outlet obstruction, postinfectious gastroparesis, diabetic  gastroparesis. Will plan for EGD today for further evaluation. Will need to correct hypokalemia this AM and hold next dose of heparin.   AKI: Likely secondary to dehydration in setting of GI losses.  Improving with IV fluids.  Management per hospitalist.  Leukocytosis: Improving. Likely multifactorial in the setting of dehydration, acute illness, ?  UTI though urine culture was negative.  Currently on ceftriaxone.   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 No urgent needs for inpatient evaluation of pancreatic lesions.   Plan: Keep NPO for now.  Proceed with EGD with propofol with Dr. Laural Golden today. The risks, benefits, and alternatives have been discussed with the patient in detail. The patient states understanding and desires to proceed.  Correction of hypokalemia this AM per hospitalist.  Hold next dose of heparin in preparation of EGD.  TSH. Ttg IgA, IgA total. Continue supportive measures.  May need to consider colonoscopy for further evaluation of diarrhea if patient is willing to stay in the hospital and prep. Will need to discuss with Dr. Laural Golden.  Further evaluation of pancreatic tail lesion outpatient.    LOS: 2 days    02/27/2021,  7:[place], [facility]

## 2021-02-27 NOTE — Progress Notes (Signed)
Pt has been up in recliner sleeping for past hour. No c/o n/v,  states has had 2 loose stools this am since 8am. Pt notified that [facility] staff will be up shortly to take him down for EGD, states understanding. Wife at bedside, advised of planned EGD and agreeable. IV potassium infusing per order, along with IV Magnesium.

## 2021-02-27 NOTE — Anesthesia Postprocedure Evaluation (Signed)
Anesthesia Post Note  Patient: Bryan Vargas  Procedure(s) Performed: ESOPHAGOGASTRODUODENOSCOPY (EGD) WITH PROPOFOL BIOPSY  Patient location during evaluation: PACU Anesthesia Type: General Level of consciousness: awake and alert and oriented Pain management: pain level controlled Vital Signs Assessment: post-procedure vital signs reviewed and stable Respiratory status: spontaneous breathing and respiratory function stable Cardiovascular status: blood pressure returned to baseline and stable Postop Assessment: no apparent nausea or vomiting Anesthetic complications: no   No notable events documented.   Last Vitals:  Vitals:   02/27/21 1230 02/27/21 1259  BP: 117/74 111/79  Pulse: 90 86  Resp: 15 20  Temp:  36.6 C  SpO2: 99% 100%    Last Pain:  Vitals:   02/27/21 1259  TempSrc: Oral  PainSc:                  Rocio Roam C Ersa Delaney

## 2021-02-27 NOTE — Progress Notes (Signed)
Brief EGD note.  Normal mucosa of proximal and mid esophagus. Linear erosions noted involving distal 3 cm of esophagus.  Grade B esophagitis. Long linear ulcer at gastric body along the greater curvature.  Biopsy taken. Extensive superficial ulceration involving lesser curvature extending to prepyloric region.  Biopsies taken. Pyloric channel patent. Bulbar duodenitis with stricture located at distal bowel being obscured by piece of corn which was removed. Stricture could not be traversed with ultraslim scope.

## 2021-02-27 NOTE — Plan of Care (Signed)

## 2021-02-27 NOTE — Care Management Important Message (Signed)
Important Message  Patient Details  Name: Bryan Vargas MRN: [id number] Date of Birth: [birth year]   Medicare Important Message Given:  Yes     Tommy Medal 02/27/2021, 3:47 PM

## 2021-02-27 NOTE — Progress Notes (Signed)
PROGRESS NOTE    Bryan Vargas  ZOX:[id number] DOB: [birth year] DOA: 02/25/2021 PCP: Quentin Cornwall, MD  Outpatient Specialists: Irine Seal MD (Urology)   Brief Narrative:  70 y.o. male, with past medical history of diabetes mellitus, BPH, presents to ED for evaluation of dehydration due to excessive diarrhea, patient had an ED visit 8 days ago, complaining of constipation, where he was started on MiraLAX, he reports he was taking MiraLAX twice daily until this Monday, he has been having multiple loose bowel movements per day, up to 20, does report over last 24 hours he did have some nausea, vomiting, denies any fever, chills, coffee-ground emesis, bright red blood per rectum, report he had screening colonoscopy at age 78, 22, at [facility], and report he was establishing with [facility] in [place], where he is supposed to have another screening colonoscopy next month, he does report history of BPH, for which he is following with at 134, he had white blood cell count of 23, hemoglobin at 16.4, urinalysis was significant for white blood cell count>50, and red blood cell 21-50, CT abdomen significant for markedly distended stomach, and lesions in the pancreatic tail, [facility] consulted to admit.   Since admission, patient has had uncontrolled diarrhea. GIP, C diff negative. Urine culture negative. Patient has received 2 doses of rocephin and should receive last dose 7/8. Leukocytosis has improved today and patient remains hemodynamically stable.  Gastroenterology was consulted and evaluated patient this hospitalization; recommends EGD, to which patient agreed. EGD was performed by Dr. Laural Golden 7/8, pending findings.   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 Patient also has stable BPH and is treated with finasteride and flomax for 6 months.   Assessment & Plan:   Principal Problem:   UTI (urinary tract infection) Active  Problems:   AKI (acute kidney injury) ([place])   Diabetes mellitus (HCC)   Dehydration   Gastric dilatation   GERD (gastroesophageal reflux disease)   Diarrhea   Hypokalemia   Leukocytosis   Metabolic acidosis   Non-intractable vomiting  AKI - Pt had a creatinine of 1.00 in Feb 2022.  He is likely prerenal given severe recent diarrhea.  He is  on IV fluids, follow BMP. Renally dose medications as appropriate. - AKI improved today with Cr trending down to 1.8 - NPO in preparation for EGD, will resume to normal diet as tolerated - Renal US 7/7 showed atrophic R kidney, multiple cysts on L kidney but no signs of obstructive hydronephrosis.    Metabolic acidosis - added sodium bicarbonate to IV fluids.  BMP 7/8 still showed bicarb of 19; continue to follow BMP in the morning.   Hypokalemia/ Hypomagnesemia - Mag was 1.5 this morning 7/8. IV potassium and magnesium supplementation ordered.   UTI - U/A obtained in ED showed WBC >50. Urine culture negative after 48 hours. Patient was not on antibiotics recently prior to admission. Patient will complete 3 doses of rocephin today 7/8. Continue to monitor leukocytosis.    Nausea and vomiting secondary to gastric dilatation - differential diagnosis includes gastric outlet obstruction vs gastroenteritis vs toxin related gastritis. Can be exacerbated by dehydration and AKI. GI following, recommends EGD with duodenal biopsy; EGD performed 7/8 with results pending.   Diarrhea - secondary to laxatives which have been discontinued. C diff was negative.  GI path panel negative. Patient continues to have uncontrolled diarrhea since admitted. Consider loperamide if continues   Pancreatic lesions -  Pt reporting that he is receiving close follow by his GI in [place] and reports the lesions have been stable. No inpatient imaging is recommended at this time, per GI   Type 2 diabetes mellitus - [ ]  f/u A1c, continue levemir and SSI coverage with CBG monitoring.      BPH - stable on proscar therapy. Following with outpatient urology     DVT prophylaxis: SQ heparin  Code Status: full  Family Communication: plan of care discussed with patient at bedside, who verbalized understanding Disposition:  Status is: Inpatient   Remains inpatient appropriate because:IV treatments appropriate due to intensity of illness or inability to take PO and Inpatient level of care appropriate due to severity of illness   Dispo: The patient is from: Home              Anticipated d/c is to: Home              Patient currently is not medically stable to d/c.              Difficult to place patient No   Consultants:  GI    Procedures:  Esophagoduodenoscopy    Antimicrobials:  Ceftriaxone 7/6>> 7/8   Subjective: Patient was evaluated and examined at bedside. Dr. Laural Golden was evaluating him as we were present in the room. Patient reported to Dr. Laural Golden that he had >10 bowel movements in 24 hours and not able to control his BM. He denies new pain. Updated patient on results of labs and imaging.   Objective: Vitals:   02/26/21 1600 02/26/21 2045 02/27/21 0606 02/27/21 1044  BP: 99/73 112/72 104/67 102/71  Pulse: 98 98 94 95  Resp: 18 18 18 20   Temp: 97.7 F (36.5 C)  97.8 F (36.6 C) 98.1 F (36.7 C)  TempSrc: Oral   Oral  SpO2: 99% 100% 98% 96%  Weight:      Height:        Intake/Output Summary (Last 24 hours) at 02/27/2021 1109 Last data filed at 02/27/2021 0600 Gross per 24 hour  Intake 1602.22 ml  Output --  Net 1602.22 ml   Filed Weights   02/25/21 1258 02/26/21 0748  Weight: 102.5 kg 104.6 kg    Examination:  General exam: Appears calm and comfortable  Respiratory system: Clear to auscultation. Respiratory effort normal. Cardiovascular system: S1 & S2 heard, RRR. No JVD, murmurs, rubs, gallops or clicks. No pedal edema. Gastrointestinal system: Abdomen is softer and nontender, though still distended. Positive bowel sound.  Central nervous  system: Alert and oriented. No focal neurological deficits. Extremities: Symmetric 5 x 5 power. Skin: No rashes, lesions or ulcers Psychiatry: Judgement and insight appear normal. Mood & affect appropriate.   Data Reviewed: I have personally reviewed following labs and imaging studies  CBC: Recent Labs  Lab 02/25/21 1315 02/26/21 0436 02/27/21 0503  WBC 23.4* 19.6* 13.2*  NEUTROABS 19.5*  --  9.4*  HGB 16.4 14.6 14.6  HCT 49.4 43.3 43.3  MCV 97.2 93.5 92.9  PLT 357 262 401   Basic Metabolic Panel: Recent Labs  Lab 02/25/21 1315 02/26/21 0436 02/27/21 0503  NA 134* 134* 136  K 3.7 3.3* 3.2*  CL 107 110 110  CO2 16* 15* 19*  GLUCOSE 192* 159* 103*  BUN 37* 45* 49*  CREATININE 2.62* 2.24* 1.82*  CALCIUM 9.6 8.5* 7.8*  MG 1.9  --  1.5*   GFR: Estimated Creatinine Clearance: 50.1 mL/min (A) (by C-G formula based on SCr  of 1.82 mg/dL (H)). Liver Function Tests: Recent Labs  Lab 02/25/21 1315  AST 13*  ALT 20  ALKPHOS 86  BILITOT 1.0  PROT 7.3  ALBUMIN 4.0   No results for input(s): LIPASE, AMYLASE in the last 168 hours. No results for input(s): AMMONIA in the last 168 hours. Coagulation Profile: Recent Labs  Lab 02/26/21 0436  INR 1.2   Cardiac Enzymes: No results for input(s): CKTOTAL, CKMB, CKMBINDEX, TROPONINI in the last 168 hours. BNP (last 3 results) No results for input(s): PROBNP in the last 8760 hours. HbA1C: No results for input(s): HGBA1C in the last 72 hours. CBG: Recent Labs  Lab 02/27/21 1040  GLUCAP 112*   Lipid Profile: No results for input(s): CHOL, HDL, LDLCALC, TRIG, CHOLHDL, LDLDIRECT in the last 72 hours. Thyroid Function Tests: Recent Labs    02/27/21 0838  TSH 2.405   Anemia Panel: No results for input(s): VITAMINB12, FOLATE, FERRITIN, TIBC, IRON, RETICCTPCT in the last 72 hours. Urine analysis:    Component Value Date/Time   COLORURINE AMBER (A) 02/25/2021 1424   APPEARANCEUR CLOUDY (A) 02/25/2021 1424    APPEARANCEUR Clear 01/15/2021 1534   LABSPEC 1.019 02/25/2021 1424   PHURINE 5.0 02/25/2021 1424   GLUCOSEU NEGATIVE 02/25/2021 1424   HGBUR SMALL (A) 02/25/2021 1424   BILIRUBINUR SMALL (A) 02/25/2021 1424   BILIRUBINUR Negative 01/15/2021 1534   KETONESUR NEGATIVE 02/25/2021 1424   PROTEINUR 100 (A) 02/25/2021 1424   NITRITE NEGATIVE 02/25/2021 1424   LEUKOCYTESUR LARGE (A) 02/25/2021 1424   Recent Results (from the past 240 hour(s))  Resp Panel by RT-PCR (Flu A&B, Covid) Nasopharyngeal Swab     Status: None   Collection Time: 02/25/21  2:54 PM   Specimen: Nasopharyngeal Swab; Nasopharyngeal(NP) swabs in vial transport medium  Result Value Ref Range Status   SARS Coronavirus 2 by RT PCR NEGATIVE NEGATIVE Final    Comment: (NOTE) SARS-CoV-2 target nucleic acids are NOT DETECTED.  The SARS-CoV-2 RNA is generally detectable in upper respiratory specimens during the acute phase of infection. The lowest concentration of SARS-CoV-2 viral copies this assay can detect is 138 copies/mL. A negative result does not preclude SARS-Cov-2 infection and should not be used as the sole basis for treatment or other patient management decisions. A negative result may occur with  improper specimen collection/handling, submission of specimen other than nasopharyngeal swab, presence of viral mutation(s) within the areas targeted by this assay, and inadequate number of viral copies(<138 copies/mL). A negative result must be combined with clinical observations, patient history, and epidemiological information. The expected result is Negative.  Fact Sheet for Patients:  EntrepreneurPulse.com.au  Fact Sheet for Healthcare Providers:  IncredibleEmployment.be  This test is no t yet approved or cleared by the [place] FDA and  has been authorized for detection and/or diagnosis of SARS-CoV-2 by FDA under an Emergency Use Authorization (EUA). This EUA will remain   in effect (meaning this test can be used) for the duration of the COVID-19 declaration under Section 564(b)(1) of the Act, 21 U.S.C.section 360bbb-3(b)(1), unless the authorization is terminated  or revoked sooner.       Influenza A by PCR NEGATIVE NEGATIVE Final   Influenza B by PCR NEGATIVE NEGATIVE Final    Comment: (NOTE) The Xpert Xpress SARS-CoV-2/FLU/RSV plus assay is intended as an aid in the diagnosis of influenza from Nasopharyngeal swab specimens and should not be used as a sole basis for treatment. Nasal washings and aspirates are unacceptable for Xpert Xpress SARS-CoV-2/FLU/RSV testing.  Fact Sheet for Patients: EntrepreneurPulse.com.au  Fact Sheet for Healthcare Providers: IncredibleEmployment.be  This test is not yet approved or cleared by the [place] FDA and has been authorized for detection and/or diagnosis of SARS-CoV-2 by FDA under an Emergency Use Authorization (EUA). This EUA will remain in effect (meaning this test can be used) for the duration of the COVID-19 declaration under Section 564(b)(1) of the Act, 21 U.S.C. section 360bbb-3(b)(1), unless the authorization is terminated or revoked.  Performed at [facility], [address] [place], [place] [address]   Urine culture     Status: None   Collection Time: 02/25/21  3:17 PM   Specimen: Urine, Clean Catch  Result Value Ref Range Status   Specimen Description   Final    URINE, CLEAN CATCH Performed at [facility], [address] [place], [place] [address]    Special Requests   Final    NONE Performed at [facility], [address], [place], [place] [address]    Culture   Final    NO GROWTH Performed at [facility], [place] [address], [place], [place] [address]    Report Status 02/27/2021 FINAL  Final  C Difficile Quick Screen w PCR reflex     Status: None   Collection Time: 02/25/21  4:31 PM   Specimen: STOOL  Result Value Ref Range Status   C  Diff antigen NEGATIVE NEGATIVE Final   C Diff toxin NEGATIVE NEGATIVE Final   C Diff interpretation No C. difficile detected.  Final    Comment: Performed at [facility], [address] [place], [place] [address]  Gastrointestinal Panel by PCR , Stool     Status: None   Collection Time: 02/25/21  4:31 PM   Specimen: STOOL  Result Value Ref Range Status   Campylobacter species NOT DETECTED NOT DETECTED Final   Plesimonas shigelloides NOT DETECTED NOT DETECTED Final   Salmonella species NOT DETECTED NOT DETECTED Final   Yersinia enterocolitica NOT DETECTED NOT DETECTED Final   Vibrio species NOT DETECTED NOT DETECTED Final   Vibrio cholerae NOT DETECTED NOT DETECTED Final   Enteroaggregative E coli (EAEC) NOT DETECTED NOT DETECTED Final   Enteropathogenic E coli (EPEC) NOT DETECTED NOT DETECTED Final   Enterotoxigenic E coli (ETEC) NOT DETECTED NOT DETECTED Final   Shiga like toxin producing E coli (STEC) NOT DETECTED NOT DETECTED Final   Shigella/Enteroinvasive E coli (EIEC) NOT DETECTED NOT DETECTED Final   Cryptosporidium NOT DETECTED NOT DETECTED Final   Cyclospora cayetanensis NOT DETECTED NOT DETECTED Final   Entamoeba histolytica NOT DETECTED NOT DETECTED Final   Giardia lamblia NOT DETECTED NOT DETECTED Final   Adenovirus F40/41 NOT DETECTED NOT DETECTED Final   Astrovirus NOT DETECTED NOT DETECTED Final   Norovirus GI/GII NOT DETECTED NOT DETECTED Final   Rotavirus A NOT DETECTED NOT DETECTED Final   Sapovirus (I, II, IV, and V) NOT DETECTED NOT DETECTED Final    Comment: Performed at [facility], [address] [place], [place] [address]  Blood culture (routine x 2)     Status: None (Preliminary result)   Collection Time: 02/25/21  6:00 PM   Specimen: Left Antecubital; Blood  Result Value Ref Range Status   Specimen Description LEFT ANTECUBITAL  Final   Special Requests   Final    BOTTLES DRAWN AEROBIC AND ANAEROBIC Blood Culture adequate volume   Culture    Final    NO GROWTH 2 DAYS Performed at [facility],  [address] [place], [place] [address]    Report Status PENDING  Incomplete  Blood culture (routine x 2)     Status: None (Preliminary result)   Collection Time: 02/25/21  6:15 PM   Specimen: Left Antecubital; Blood  Result Value Ref Range Status   Specimen Description LEFT ANTECUBITAL  Final   Special Requests   Final    BOTTLES DRAWN AEROBIC AND ANAEROBIC Blood Culture adequate volume   Culture   Final    NO GROWTH 2 DAYS Performed at [facility], [address], [place], [place] [address]    Report Status PENDING  Incomplete         Radiology Studies: CT Abdomen Pelvis Wo Contrast  Result Date: 02/25/2021 CLINICAL DATA:  Diarrhea for 4 days. Leukocytosis. Acute kidney injury. EXAM: CT ABDOMEN AND PELVIS WITHOUT CONTRAST TECHNIQUE: Multidetector CT imaging of the abdomen and pelvis was performed following the standard protocol without IV contrast. COMPARISON:  09/30/2020 FINDINGS: Lower chest: No acute findings. Hepatobiliary: No mass visualized on this unenhanced exam. Gallbladder is distended but otherwise unremarkable in appearance. No evidence of biliary ductal dilatation. Pancreas: 2 adjacent lesions are seen in the pancreatic tail, largest measuring 2.5 cm, which cannot be characterized on this unenhanced exam. No evidence of peripancreatic inflammatory changes. Spleen:  Within normal limits in size. Adrenals/Urinary tract: Severe diffuse right renal atrophy is seen. Multiple left renal cysts are stable including a few hemorrhagic cysts. No evidence of ureteral calculi or hydronephrosis. Diffuse bladder wall thickening is again seen, consistent with chronic bladder outlet obstruction. A few tiny less than 5 mm calculi are noted in the urinary bladder. Stomach/Bowel: Stomach is distended, but small and large bowel are nondilated. No evidence of obstruction, inflammatory process, or abnormal fluid collections. Vascular/Lymphatic:  No pathologically enlarged lymph nodes identified. No evidence of abdominal aortic aneurysm. Reproductive:  Stable markedly enlarged prostate. Other:  None. Musculoskeletal:  No suspicious bone lesions identified. IMPRESSION: Markedly distended stomach. Gastroparesis or gastric outlet obstruction cannot be excluded. Stable markedly enlarged prostate and findings of chronic bladder outlet obstruction. Tiny less than 5 mm urinary bladder calculi. No evidence of ureteral calculi or hydronephrosis. 2 adjacent lesions in pancreatic tail, largest measuring 2.5 cm, which cannot be characterized on this unenhanced exam. Pancreas protocol abdomen MRI or CT without and with contrast recommended for further characterization. Electronically Signed   By: Marlaine Hind M.D.   On: 02/25/2021 16:52   US RENAL  Result Date: 02/26/2021 CLINICAL DATA:  Acute kidney injury, history diabetes mellitus, coronary artery disease EXAM: RENAL / URINARY TRACT ULTRASOUND COMPLETE COMPARISON:  CT abdomen pelvis without contrast 02/25/2021 FINDINGS: Right Kidney: Renal measurements: 5.8 x 3.6 x 2.1 cm = volume: 23 mL. Small and atrophic with thinned echogenic cortex. No mass or hydronephrosis. Left Kidney: Renal measurements: 17.6 x 7.4 x 7.5 cm = volume: 509 mL. Normal cortical thickness. Increased cortical echogenicity. Exophytic cyst at lower kidney 6.2 x 5.6 x 6.1 cm. Additional simple cyst at lower kidney 4.2 x 3.4 x 3.4 cm. Multiple additional smaller cysts are identified. Total of 11 cysts are noted. No hydronephrosis or shadowing calcification. Bladder: Mild bladder wall thickening. Other: Significantly enlarged prostate gland extending into the bladder base, prostate gland measuring 6.6 x 8.9 x 10.5 cm a calculated volume of 323 mL. IMPRESSION: Atrophic RIGHT kidney. Medical renal disease lytic changes LEFT kidney with multiple cysts up to 6.2 cm diameter. Markedly enlarged prostate gland with associated bladder wall thickening which  may reflect  chronic outlet obstruction. Electronically Signed   By: Lavonia Dana M.D.   On: 02/26/2021 17:16   ECHOCARDIOGRAM COMPLETE  Result Date: 02/26/2021    ECHOCARDIOGRAM REPORT   Patient Name:   Bryan Vargas Date of Exam: 02/26/2021 Medical Rec #:  [id number]    Height:       75.0 in Accession #:    [id number]   Weight:       230.6 lb Date of Birth:  02-20-1951   BSA:          2.331 m Patient Age:    76 years     BP:           123/72 mmHg Patient Gender: M            HR:           100 bpm. Exam Location:  [facility] Procedure: 2D Echo, Cardiac Doppler and Color Doppler Indications:    Atrial Fibrillation  History:        Patient has no prior history of Echocardiogram examinations.                 Risk Factors:Diabetes.  Sonographer:    Wenda Low Referring Phys: [id number] [place]  1. Left ventricular ejection fraction, by estimation, is 55 to 60%. The left ventricle has normal function. The left ventricle has no regional wall motion abnormalities. There is mild left ventricular hypertrophy. Left ventricular diastolic parameters were normal.  2. Right ventricular systolic function is normal. The right ventricular size is normal. There is normal pulmonary artery systolic pressure.  3. The mitral valve is normal in structure. Trivial mitral valve regurgitation.  4. The aortic valve is tricuspid. Aortic valve regurgitation is not visualized. Mild aortic valve sclerosis is present, with no evidence of aortic valve stenosis. FINDINGS  Left Ventricle: Left ventricular ejection fraction, by estimation, is 55 to 60%. The left ventricle has normal function. The left ventricle has no regional wall motion abnormalities. The left ventricular internal cavity size was normal in size. There is  mild left ventricular hypertrophy. Left ventricular diastolic parameters were normal. Right Ventricle: The right ventricular size is normal. Right vetricular wall thickness was not assessed. Right  ventricular systolic function is normal. There is normal pulmonary artery systolic pressure. The tricuspid regurgitant velocity is 2.67 m/s, and with an assumed right atrial pressure of 3 mmHg, the estimated right ventricular systolic pressure is 32.9 mmHg. Left Atrium: Left atrial size was normal in size. Right Atrium: Right atrial size was normal in size. Pericardium: There is no evidence of pericardial effusion. Mitral Valve: The mitral valve is normal in structure. Trivial mitral valve regurgitation. MV peak gradient, 1.8 mmHg. The mean mitral valve gradient is 1.0 mmHg. Tricuspid Valve: The tricuspid valve is normal in structure. Tricuspid valve regurgitation is trivial. Aortic Valve: The aortic valve is tricuspid. Aortic valve regurgitation is not visualized. Mild aortic valve sclerosis is present, with no evidence of aortic valve stenosis. Aortic valve mean gradient measures 2.0 mmHg. Aortic valve peak gradient measures 3.5 mmHg. Aortic valve area, by VTI measures 2.39 cm. Pulmonic Valve: The pulmonic valve was normal in structure. Pulmonic valve regurgitation is not visualized. Aorta: The aortic root is normal in size and structure. IAS/Shunts: No atrial level shunt detected by color flow Doppler.  LEFT VENTRICLE PLAX 2D LVIDd:         3.95 cm  Diastology LVIDs:         2.93 cm  LV  e' medial:    6.13 cm/s LV PW:         1.21 cm  LV E/e' medial:  7.0 LV IVS:        1.03 cm  LV e' lateral:   13.60 cm/s LVOT diam:     2.00 cm  LV E/e' lateral: 3.2 LV SV:         47 LV SV Index:   20 LVOT Area:     3.14 cm  RIGHT VENTRICLE RV Basal diam:  3.79 cm RV Mid diam:    2.90 cm LEFT ATRIUM           Index       RIGHT ATRIUM           Index LA diam:      3.20 cm 1.37 cm/m  RA Area:     17.30 cm LA Vol (A2C): 26.3 ml 11.28 ml/m RA Volume:   49.10 ml  21.06 ml/m LA Vol (A4C): 29.0 ml 12.44 ml/m  AORTIC VALVE AV Area (Vmax):    2.64 cm AV Area (Vmean):   2.43 cm AV Area (VTI):     2.39 cm AV Vmax:           93.30  cm/s AV Vmean:          64.700 cm/s AV VTI:            0.197 m AV Peak Grad:      3.5 mmHg AV Mean Grad:      2.0 mmHg LVOT Vmax:         78.30 cm/s LVOT Vmean:        50.000 cm/s LVOT VTI:          0.150 m LVOT/AV VTI ratio: 0.76  AORTA Ao Root diam: 3.40 cm Ao Asc diam:  3.60 cm MITRAL VALVE               TRICUSPID VALVE MV Area (PHT): 3.74 cm    TR Peak grad:   28.5 mmHg MV Area VTI:   3.34 cm    TR Vmax:        267.00 cm/s MV Peak grad:  1.8 mmHg MV Mean grad:  1.0 mmHg    SHUNTS MV Vmax:       0.68 m/s    Systemic VTI:  0.15 m MV Vmean:      33.0 cm/s   Systemic Diam: 2.00 cm MV Decel Time: 203 msec MV E velocity: 43.20 cm/s MV A velocity: 60.60 cm/s MV E/A ratio:  0.71 Dorris Carnes MD Electronically signed by Dorris Carnes MD Signature Date/Time: 02/26/2021/5:05:56 PM    Final         Scheduled Meds:  [MAR Hold] aspirin EC  81 mg Oral Daily   [MAR Hold] finasteride  5 mg Oral Daily   [MAR Hold] heparin  5,000 Units Subcutaneous Q8H   [MAR Hold] insulin aspart  0-9 Units Subcutaneous TID WC   [MAR Hold] insulin detemir  10 Units Subcutaneous Daily   [MAR Hold] latanoprost  1 drop Both Eyes Daily   [MAR Hold] ondansetron  4 mg Intravenous Once   [MAR Hold] pantoprazole  40 mg Oral Daily   Continuous Infusions:  sodium chloride     [MAR Hold] cefTRIAXone (ROCEPHIN)  IV 1 g (02/26/21 1836)   lactated ringers     [MAR Hold] potassium chloride 10 mEq (02/27/21 1009)    sodium bicarbonate (isotonic) infusion in sterile water 125 mL/hr  at 02/27/21 0310     LOS: 2 days    Time spent: 30 minutes  Fredrik Rigger, Medical Student [facility] Pager [phone]  If 7PM-7AM, please contact night-coverage [email or web address] Password [email or web address] 02/27/2021, 11:09 AM  ____________________________________________________________ ATTENDING NOTE  Patient seen and examined with Fredrik Rigger, Medical student. In addition to supervising the encounter, I played a key role in the decision making process as well  as reviewed key findings.  Pt still having frequent loose stools.  EGD this morning.  I spoke with Dr. Laural Golden, will follow up with recommendations after procedure.  Electrolytes being replaced.  Recheck them in AM.    Murvin Natal, MD  How to contact the [facility] Attending or Consulting provider [place] or covering provider during after hours [place], for this patient?  Check the care team in [facility] and look for a) attending/consulting [facility] provider listed and b) the [facility] team listed Log into [email or web address] and use [place]'s universal password to access. If you do not have the password, please contact the hospital operator. Locate the [facility] provider you are looking for under [facility] and page to a number that you can be directly reached. If you still have difficulty reaching the provider, please page the [facility] (Director on Call) for the Hospitalists listed on amion for assistance.

## 2021-02-27 NOTE — Transfer of Care (Signed)
Immediate Anesthesia Transfer of Care Note  Patient: Bryan Vargas  Procedure(s) Performed: ESOPHAGOGASTRODUODENOSCOPY (EGD) WITH PROPOFOL BIOPSY  Patient Location: PACU  Anesthesia Type:General  Level of Consciousness: awake and patient cooperative  Airway & Oxygen Therapy: Patient Spontanous Breathing and non-rebreather face mask  Post-op Assessment: Report given to RN and Post -op Vital signs reviewed and stable  Post vital signs: Reviewed and stable  Last Vitals:  Vitals Value Taken Time  BP    Temp 98   Pulse 90 02/27/21 1218  Resp 14 02/27/21 1218  SpO2 100 % 02/27/21 1218  Vitals shown include unvalidated device data.  Last Pain:  Vitals:   02/27/21 1130  TempSrc:   PainSc: 0-No pain      Patients Stated Pain Goal: 8 (78/46/96 2952)  Complications: No notable events documented.

## 2021-02-27 NOTE — Progress Notes (Signed)
Pt down to Endoscopy via WC.

## 2021-02-28 DIAGNOSIS — R112 Nausea with vomiting, unspecified: Secondary | ICD-10-CM

## 2021-02-28 DIAGNOSIS — R197 Diarrhea, unspecified: Secondary | ICD-10-CM

## 2021-02-28 LAB — BASIC METABOLIC PANEL
Anion gap: 7 (ref 5–15)
BUN: 34 mg/dL — ABNORMAL HIGH (ref 8–23)
CO2: 25 mmol/L (ref 22–32)
Calcium: 7.5 mg/dL — ABNORMAL LOW (ref 8.9–10.3)
Chloride: 105 mmol/L (ref 98–111)
Creatinine, Ser: 1.32 mg/dL — ABNORMAL HIGH (ref 0.61–1.24)
GFR, Estimated: 58 mL/min — ABNORMAL LOW (ref >60)
Glucose, Bld: 84 mg/dL (ref 70–99)
Potassium: 2.9 mmol/L — ABNORMAL LOW (ref 3.5–5.1)
Sodium: 137 mmol/L (ref 135–145)

## 2021-02-28 LAB — GLUCOSE, CAPILLARY
Glucose-Capillary: 86 mg/dL (ref 70–99)
Glucose-Capillary: 98 mg/dL (ref 70–99)
Glucose-Capillary: 109 mg/dL — ABNORMAL HIGH (ref 70–99)
Glucose-Capillary: 100 mg/dL — ABNORMAL HIGH (ref 70–99)

## 2021-02-28 LAB — MAGNESIUM: Magnesium: 2.2 mg/dL (ref 1.7–2.4)

## 2021-02-28 LAB — ALBUMIN: Albumin: 2.4 g/dL — ABNORMAL LOW (ref 3.5–5.0)

## 2021-02-28 LAB — IGA: IgA: 176 mg/dL (ref 61–437)

## 2021-02-28 MED ORDER — POTASSIUM CHLORIDE 10 MEQ/100ML IV SOLN
10.0000 meq | INTRAVENOUS | Status: AC
  Administered 2021-02-28 (×4): 10 meq via INTRAVENOUS
  Filled 2021-02-28 (×4): qty 100

## 2021-02-28 MED ORDER — KCL-LACTATED RINGERS 20 MEQ/L IV SOLN: INTRAVENOUS | Status: DC

## 2021-02-28 MED ORDER — GLUCERNA SHAKE PO LIQD
237.0000 mL | Freq: Two times a day (BID) | ORAL | Status: DC
  Administered 2021-02-28 – 2021-03-04 (×5): 237 mL via ORAL

## 2021-02-28 MED ORDER — POTASSIUM CHLORIDE IN NACL 20-0.9 MEQ/L-% IV SOLN: INTRAVENOUS | Status: DC

## 2021-02-28 NOTE — Progress Notes (Signed)
PROGRESS NOTE    Bryan Vargas  HYI:[id number] DOB: [birth year] DOA: 02/25/2021 PCP: Quentin Cornwall, MD  Outpatient Specialists: Irine Seal MD (Urology)   Brief Narrative:  70 y.o. male, with past medical history of diabetes mellitus, BPH, presents to ED for evaluation of dehydration due to excessive diarrhea, patient had an ED visit 8 days ago, complaining of constipation, where he was started on MiraLAX, he reports he was taking MiraLAX twice daily until this Monday, he has been having multiple loose bowel movements per day, up to 20, does report over last 24 hours he did have some nausea, vomiting, denies any fever, chills, coffee-ground emesis, bright red blood per rectum, report he had screening colonoscopy at age 35, 6, at [facility], and report he was establishing with [facility] in [place], where he is supposed to have another screening colonoscopy next month, he does report history of BPH, for which he is following with at 134, he had white blood cell count of 23, hemoglobin at 16.4, urinalysis was significant for white blood cell count>50, and red blood cell 21-50, CT abdomen significant for markedly distended stomach, and lesions in the pancreatic tail, [facility] hospitalist consulted to admit.   Since admission, patient has had uncontrolled diarrhea. GIP, C diff negative. Urine culture negative. Patient has received 2 doses of rocephin and should receive last dose 7/8. Leukocytosis has improved today and patient remains hemodynamically stable.  Gastroenterology was consulted and evaluated patient this hospitalization; recommends EGD, to which patient agreed. EGD was performed by Dr. Laural Golden 7/8.   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 Patient also has stable BPH and is treated with finasteride and flomax for 6 months.   Assessment & Plan:   Principal Problem:   UTI (urinary tract infection) Active Problems:   AKI  (acute kidney injury) ([place])   Diabetes mellitus (HCC)   Dehydration   Gastric dilatation   GERD (gastroesophageal reflux disease)   Diarrhea   Hypokalemia   Leukocytosis   Metabolic acidosis   Non-intractable vomiting  AKI - Pt had a creatinine of 1.00 in Feb 2022.  He is likely prerenal given severe recent diarrhea.  He is  on IV fluids.  He is doing much better and creatinine improving with IV fluids.  - AKI improved today with Cr trending down to 1.3 - Renal US 7/7 showed atrophic R kidney, multiple cysts on L kidney but no signs of obstructive hydronephrosis.    Metabolic acidosis - resolved now after starting sodium bicarbonate IV fluids.  Change fluids to LR.    Hypokalemia/ Hypomagnesemia - Mag was 2.2.  Additional IV potassium replacement was ordered today, recheck in AM.    UTI - U/A obtained in ED showed WBC >50. Urine culture negative after 48 hours. Patient was not on antibiotics recently prior to admission. Patient completed 3 doses of rocephin on 7/8.    Gastric outlet obstruction - Continue full liquid diet and planning for repeat EGD on 7/11.  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    Nausea and vomiting  - secondary to EGD proven gastric outlet obstruction.  Discussed with Dr. Laural Golden and he is planning to attempt to dilate again on Monday. At this time he remains on full liquid diet.     Diarrhea - multifactorial. C diff was negative.  GI path panel negative. Patient continues to have loose stool.    Pancreatic  lesions - Pt reporting that he is receiving close follow by his GI in [place] and reports the lesions have been stable. No inpatient imaging is recommended at this time, per GI   Type 2 diabetes mellitus - A1c 5.8% which is evidence of optimal glycemic control, continue levemir and SSI coverage with frequent CBG monitoring.     BPH - stable on proscar therapy. Following with outpatient urology    DVT prophylaxis: SQ  heparin  Code Status: full  Family Communication: plan of care discussed with patient at bedside, who verbalized understanding Disposition: home Status is: Inpatient   Remains inpatient appropriate because:IV treatments appropriate due to intensity of illness or inability to take PO and Inpatient level of care appropriate due to severity of illness   Dispo: The patient is from: Home              Anticipated d/c is to: Home              Patient currently is not medically stable to d/c.              Difficult to place patient No   Consultants:  GI    Procedures:  Esophagoduodenoscopy    Antimicrobials:  Ceftriaxone 7/6>> 7/8  Subjective: Patient wanting to go home, he is tolerating full liquid diet.  No abdominal pain.  Diarrhea is slowing down now.    Objective: Vitals:   02/27/21 1259 02/27/21 1959 02/28/21 0541 02/28/21 1328  BP: 111/79 97/68 (!) 95/54 (!) 91/56  Pulse: 86 85 81 86  Resp: 20 19 18 18   Temp: 97.9 F (36.6 C) 97.6 F (36.4 C) 98.2 F (36.8 C) 97.8 F (36.6 C)  TempSrc: Oral Oral Oral Oral  SpO2: 100% 100% 98% 100%  Weight:      Height:        Intake/Output Summary (Last 24 hours) at 02/28/2021 1440 Last data filed at 02/28/2021 1215 Gross per 24 hour  Intake 4436.81 ml  Output --  Net 4436.81 ml   Filed Weights   02/25/21 1258 02/26/21 0748  Weight: 102.5 kg 104.6 kg   Examination:  General exam: obese male, awake, alert, cooperative, Appears calm and comfortable  Respiratory system: Clear to auscultation. Respiratory effort normal. Cardiovascular system: normal S1 & S2 heard.  No JVD, murmurs, rubs, gallops or clicks. No pedal edema. Gastrointestinal system: Abdomen is softer and nontender, though still distended. Positive bowel sound.  Central nervous system: Alert and oriented. No focal neurological deficits. Extremities: Symmetric 5 x 5 power. Skin: No rashes, lesions or ulcers Psychiatry: Judgement and insight appear normal. Mood & affect  appropriate.   Data Reviewed: I have personally reviewed following labs and imaging studies  CBC: Recent Labs  Lab 02/25/21 1315 02/26/21 0436 02/27/21 0503  WBC 23.4* 19.6* 13.2*  NEUTROABS 19.5*  --  9.4*  HGB 16.4 14.6 14.6  HCT 49.4 43.3 43.3  MCV 97.2 93.5 92.9  PLT 357 262 007   Basic Metabolic Panel: Recent Labs  Lab 02/25/21 1315 02/26/21 0436 02/27/21 0503 02/28/21 0358  NA 134* 134* 136 137  K 3.7 3.3* 3.2* 2.9*  CL 107 110 110 105  CO2 16* 15* 19* 25  GLUCOSE 192* 159* 103* 84  BUN 37* 45* 49* 34*  CREATININE 2.62* 2.24* 1.82* 1.32*  CALCIUM 9.6 8.5* 7.8* 7.5*  MG 1.9  --  1.5* 2.2   GFR: Estimated Creatinine Clearance: 69.1 mL/min (A) (by C-G formula based on SCr of 1.32 mg/dL (  H)). Liver Function Tests: Recent Labs  Lab 02/25/21 1315 02/28/21 0358  AST 13*  --   ALT 20  --   ALKPHOS 86  --   BILITOT 1.0  --   PROT 7.3  --   ALBUMIN 4.0 2.4*   No results for input(s): LIPASE, AMYLASE in the last 168 hours. No results for input(s): AMMONIA in the last 168 hours. Coagulation Profile: Recent Labs  Lab 02/26/21 0436  INR 1.2   Cardiac Enzymes: No results for input(s): CKTOTAL, CKMB, CKMBINDEX, TROPONINI in the last 168 hours. BNP (last 3 results) No results for input(s): PROBNP in the last 8760 hours. HbA1C: Recent Labs    02/27/21 0503  HGBA1C 5.8*   CBG: Recent Labs  Lab 02/27/21 1040 02/27/21 1705 02/27/21 1955 02/28/21 0753 02/28/21 1148  GLUCAP 112* 84 99 86 98   Lipid Profile: No results for input(s): CHOL, HDL, LDLCALC, TRIG, CHOLHDL, LDLDIRECT in the last 72 hours. Thyroid Function Tests: Recent Labs    02/27/21 0838  TSH 2.405   Anemia Panel: No results for input(s): VITAMINB12, FOLATE, FERRITIN, TIBC, IRON, RETICCTPCT in the last 72 hours. Urine analysis:    Component Value Date/Time   COLORURINE AMBER (A) 02/25/2021 1424   APPEARANCEUR CLOUDY (A) 02/25/2021 1424   APPEARANCEUR Clear 01/15/2021 1534    LABSPEC 1.019 02/25/2021 1424   PHURINE 5.0 02/25/2021 1424   GLUCOSEU NEGATIVE 02/25/2021 1424   HGBUR SMALL (A) 02/25/2021 1424   BILIRUBINUR SMALL (A) 02/25/2021 1424   BILIRUBINUR Negative 01/15/2021 1534   KETONESUR NEGATIVE 02/25/2021 1424   PROTEINUR 100 (A) 02/25/2021 1424   NITRITE NEGATIVE 02/25/2021 1424   LEUKOCYTESUR LARGE (A) 02/25/2021 1424   Recent Results (from the past 240 hour(s))  Resp Panel by RT-PCR (Flu A&B, Covid) Nasopharyngeal Swab     Status: None   Collection Time: 02/25/21  2:54 PM   Specimen: Nasopharyngeal Swab; Nasopharyngeal(NP) swabs in vial transport medium  Result Value Ref Range Status   SARS Coronavirus 2 by RT PCR NEGATIVE NEGATIVE Final    Comment: (NOTE) SARS-CoV-2 target nucleic acids are NOT DETECTED.  The SARS-CoV-2 RNA is generally detectable in upper respiratory specimens during the acute phase of infection. The lowest concentration of SARS-CoV-2 viral copies this assay can detect is 138 copies/mL. A negative result does not preclude SARS-Cov-2 infection and should not be used as the sole basis for treatment or other patient management decisions. A negative result may occur with  improper specimen collection/handling, submission of specimen other than nasopharyngeal swab, presence of viral mutation(s) within the areas targeted by this assay, and inadequate number of viral copies(<138 copies/mL). A negative result must be combined with clinical observations, patient history, and epidemiological information. The expected result is Negative.  Fact Sheet for Patients:  EntrepreneurPulse.com.au  Fact Sheet for Healthcare Providers:  IncredibleEmployment.be  This test is no t yet approved or cleared by the [place] FDA and  has been authorized for detection and/or diagnosis of SARS-CoV-2 by FDA under an Emergency Use Authorization (EUA). This EUA will remain  in effect (meaning this test can be  used) for the duration of the COVID-19 declaration under Section 564(b)(1) of the Act, 21 U.S.C.section 360bbb-3(b)(1), unless the authorization is terminated  or revoked sooner.       Influenza A by PCR NEGATIVE NEGATIVE Final   Influenza B by PCR NEGATIVE NEGATIVE Final    Comment: (NOTE) The Xpert Xpress SARS-CoV-2/FLU/RSV plus assay is intended as an aid in the  diagnosis of influenza from Nasopharyngeal swab specimens and should not be used as a sole basis for treatment. Nasal washings and aspirates are unacceptable for Xpert Xpress SARS-CoV-2/FLU/RSV testing.  Fact Sheet for Patients: EntrepreneurPulse.com.au  Fact Sheet for Healthcare Providers: IncredibleEmployment.be  This test is not yet approved or cleared by the [place] FDA and has been authorized for detection and/or diagnosis of SARS-CoV-2 by FDA under an Emergency Use Authorization (EUA). This EUA will remain in effect (meaning this test can be used) for the duration of the COVID-19 declaration under Section 564(b)(1) of the Act, 21 U.S.C. section 360bbb-3(b)(1), unless the authorization is terminated or revoked.  Performed at [facility], [address], [place], [place] [address]   Urine culture     Status: None   Collection Time: 02/25/21  3:17 PM   Specimen: Urine, Clean Catch  Result Value Ref Range Status   Specimen Description   Final    URINE, CLEAN CATCH Performed at [facility], [address], [place], [place] [address]    Special Requests   Final    NONE Performed at [facility], [address] [place], [place] [address]    Culture   Final    NO GROWTH Performed at [facility], [place] [address] [place], [place] [address]    Report Status 02/27/2021 FINAL  Final  C Difficile Quick Screen w PCR reflex     Status: None   Collection Time: 02/25/21  4:31 PM   Specimen: STOOL  Result Value Ref Range Status   C Diff antigen NEGATIVE NEGATIVE Final    C Diff toxin NEGATIVE NEGATIVE Final   C Diff interpretation No C. difficile detected.  Final    Comment: Performed at [facility], [address], [place], [place] [address]  Gastrointestinal Panel by PCR , Stool     Status: None   Collection Time: 02/25/21  4:31 PM   Specimen: STOOL  Result Value Ref Range Status   Campylobacter species NOT DETECTED NOT DETECTED Final   Plesimonas shigelloides NOT DETECTED NOT DETECTED Final   Salmonella species NOT DETECTED NOT DETECTED Final   Yersinia enterocolitica NOT DETECTED NOT DETECTED Final   Vibrio species NOT DETECTED NOT DETECTED Final   Vibrio cholerae NOT DETECTED NOT DETECTED Final   Enteroaggregative E coli (EAEC) NOT DETECTED NOT DETECTED Final   Enteropathogenic E coli (EPEC) NOT DETECTED NOT DETECTED Final   Enterotoxigenic E coli (ETEC) NOT DETECTED NOT DETECTED Final   Shiga like toxin producing E coli (STEC) NOT DETECTED NOT DETECTED Final   Shigella/Enteroinvasive E coli (EIEC) NOT DETECTED NOT DETECTED Final   Cryptosporidium NOT DETECTED NOT DETECTED Final   Cyclospora cayetanensis NOT DETECTED NOT DETECTED Final   Entamoeba histolytica NOT DETECTED NOT DETECTED Final   Giardia lamblia NOT DETECTED NOT DETECTED Final   Adenovirus F40/41 NOT DETECTED NOT DETECTED Final   Astrovirus NOT DETECTED NOT DETECTED Final   Norovirus GI/GII NOT DETECTED NOT DETECTED Final   Rotavirus A NOT DETECTED NOT DETECTED Final   Sapovirus (I, II, IV, and V) NOT DETECTED NOT DETECTED Final    Comment: Performed at [facility], [address] [place],  [address]  Blood culture (routine x 2)     Status: None (Preliminary result)   Collection Time: 02/25/21  6:00 PM   Specimen: Left Antecubital; Blood  Result Value Ref Range Status   Specimen Description LEFT ANTECUBITAL  Final   Special Requests   Final  BOTTLES DRAWN AEROBIC AND ANAEROBIC Blood Culture adequate volume   Culture   Final    NO GROWTH 3 DAYS Performed  at [facility], [address], [place], [place] [address]    Report Status PENDING  Incomplete  Blood culture (routine x 2)     Status: None (Preliminary result)   Collection Time: 02/25/21  6:15 PM   Specimen: Left Antecubital; Blood  Result Value Ref Range Status   Specimen Description LEFT ANTECUBITAL  Final   Special Requests   Final    BOTTLES DRAWN AEROBIC AND ANAEROBIC Blood Culture adequate volume   Culture   Final    NO GROWTH 3 DAYS Performed at [facility], [address], [place], [place] [address]    Report Status PENDING  Incomplete    Radiology Studies: US RENAL  Result Date: 02/26/2021 CLINICAL DATA:  Acute kidney injury, history diabetes mellitus, coronary artery disease EXAM: RENAL / URINARY TRACT ULTRASOUND COMPLETE COMPARISON:  CT abdomen pelvis without contrast 02/25/2021 FINDINGS: Right Kidney: Renal measurements: 5.8 x 3.6 x 2.1 cm = volume: 23 mL. Small and atrophic with thinned echogenic cortex. No mass or hydronephrosis. Left Kidney: Renal measurements: 17.6 x 7.4 x 7.5 cm = volume: 509 mL. Normal cortical thickness. Increased cortical echogenicity. Exophytic cyst at lower kidney 6.2 x 5.6 x 6.1 cm. Additional simple cyst at lower kidney 4.2 x 3.4 x 3.4 cm. Multiple additional smaller cysts are identified. Total of 11 cysts are noted. No hydronephrosis or shadowing calcification. Bladder: Mild bladder wall thickening. Other: Significantly enlarged prostate gland extending into the bladder base, prostate gland measuring 6.6 x 8.9 x 10.5 cm a calculated volume of 323 mL. IMPRESSION: Atrophic RIGHT kidney. Medical renal disease lytic changes LEFT kidney with multiple cysts up to 6.2 cm diameter. Markedly enlarged prostate gland with associated bladder wall thickening which may reflect chronic outlet obstruction. Electronically Signed   By: Lavonia Dana M.D.   On: 02/26/2021 17:16     Scheduled Meds:  feeding supplement (GLUCERNA SHAKE)  237 mL Oral BID BM   finasteride   5 mg Oral Daily   heparin  5,000 Units Subcutaneous Q8H   insulin aspart  0-9 Units Subcutaneous TID WC   insulin detemir  10 Units Subcutaneous Daily   latanoprost  1 drop Both Eyes Daily   ondansetron  4 mg Intravenous Once   pantoprazole (PROTONIX) IV  40 mg Intravenous Q12H   Continuous Infusions:   sodium bicarbonate (isotonic) infusion in sterile water 125 mL/hr at 02/28/21 1005    LOS: 3 days   Time spent: 30 minutes  Leonetta Mcgivern Wynetta Emery, MD How to contact the [facility] Attending or Consulting provider [place] or covering provider during after hours [place], for this patient?  Check the care team in [facility] and look for a) attending/consulting [facility] provider listed and b) the [facility] team listed Log into [email or web address] and use [place]'s universal password to access. If you do not have the password, please contact the hospital operator. Locate the [facility] provider you are looking for under [facility] and page to a number that you can be directly reached. If you still have difficulty reaching the provider, please page the [facility] (Director on Call) for the Hospitalists listed on amion for assistance.  [facility]

## 2021-02-28 NOTE — Progress Notes (Signed)
Subjective:  Patient feels much better.  He has not experienced nausea regurgitation or abdominal pain since he has been on liquids.  He is hungry.  He has had 3 bowel movements this morning.  Stool volume is moderate and consistency is improving.  No melena or rectal bleeding.  He says lower extremity edema has occurred since this admission.  Current Medications:  Current Facility-Administered Medications:    acetaminophen (TYLENOL) tablet 650 mg, 650 mg, Oral, Q6H PRN, 650 mg at 02/28/21 0836 **OR** acetaminophen (TYLENOL) suppository 650 mg, 650 mg, Rectal, Q6H PRN, Elgergawy, Silver Huguenin, MD   feeding supplement (GLUCERNA SHAKE) (GLUCERNA SHAKE) liquid 237 mL, 237 mL, Oral, BID BM, Deandrae Wajda U, MD   finasteride (PROSCAR) tablet 5 mg, 5 mg, Oral, Daily, Elgergawy, Silver Huguenin, MD, 5 mg at 02/28/21 0836   heparin injection 5,000 Units, 5,000 Units, Subcutaneous, Q8H, Johnson, Clanford L, MD, 5,000 Units at 02/28/21 0900   insulin aspart (novoLOG) injection 0-9 Units, 0-9 Units, Subcutaneous, TID WC, Johnson, Clanford L, MD   insulin detemir (LEVEMIR) injection 10 Units, 10 Units, Subcutaneous, Daily, Elgergawy, Silver Huguenin, MD, 10 Units at 02/27/21 1408   latanoprost (XALATAN) 0.005 % ophthalmic solution 1 drop, 1 drop, Both Eyes, Daily, Elgergawy, Silver Huguenin, MD, 1 drop at 02/27/21 1529   metoCLOPramide (REGLAN) injection 5 mg, 5 mg, Intravenous, Q6H PRN, Elgergawy, Silver Huguenin, MD, 5 mg at 02/26/21 0209   ondansetron (ZOFRAN) injection 4 mg, 4 mg, Intravenous, Once, Elgergawy, Silver Huguenin, MD   pantoprazole (PROTONIX) injection 40 mg, 40 mg, Intravenous, Q12H, Moorea Boissonneault U, MD, 40 mg at 02/28/21 0836   potassium chloride 10 mEq in 100 mL IVPB, 10 mEq, Intravenous, Q1 Hr x 4, Johnson, Clanford L, MD, Last Rate: 100 mL/hr at 02/28/21 1004, 10 mEq at 02/28/21 1004   sodium bicarbonate 150 mEq in sterile water 1,150 mL infusion, , Intravenous, Continuous, Johnson, Clanford L, MD, Last Rate: 125 mL/hr at  02/28/21 1005, New Bag at 02/28/21 1005   traZODone (DESYREL) tablet 50 mg, 50 mg, Oral, QHS PRN, Reubin Milan, MD, 50 mg at 02/26/21 0231  Objective: Blood pressure (!) 95/54, pulse 81, temperature 98.2 F (36.8 C), temperature source Oral, resp. rate 18, height _0  (1.905 m), weight 104.6 kg, SpO2 98 %. Patient is alert and in no acute distress. Cardiac exam with regular rhythm normal S1 and S2. No murmur or gallop noted. Lungs are clear to auscultation. Abdomen is full.  Bowel sounds are normal.  No succussion splash noted in epigastric region.  On palpation abdomen is soft and nontender with organomegaly or masses. He has 1-2+ edema involving both lower extremities at level of knees.  He does not have calf tenderness.  Labs/studies Results:   CBC Latest Ref Rng & Units 02/27/2021 02/26/2021 02/25/2021  WBC 4.0 - 10.5 K/uL 13.2(H) 19.6(H) 23.4(H)  Hemoglobin 13.0 - 17.0 g/dL 14.6 14.6 16.4  Hematocrit 39.0 - 52.0 % 43.3 43.3 49.4  Platelets 150 - 400 K/uL 280 262 357    CMP Latest Ref Rng & Units 02/28/2021 02/27/2021 02/26/2021  Glucose 70 - 99 mg/dL 84 103(H) 159(H)  BUN 8 - 23 mg/dL 34(H) 49(H) 45(H)  Creatinine 0.61 - 1.24 mg/dL 1.32(H) 1.82(H) 2.24(H)  Sodium 135 - 145 mmol/L 137 136 134(L)  Potassium 3.5 - 5.1 mmol/L 2.9(L) 3.2(L) 3.3(L)  Chloride 98 - 111 mmol/L 105 110 110  CO2 22 - 32 mmol/L 25 19(L) 15(L)  Calcium 8.9 - 10.3 mg/dL 7.5(L)  7.8(L) 8.5(L)  Total Protein 6.5 - 8.1 g/dL - - -  Total Bilirubin 0.3 - 1.2 mg/dL - - -  Alkaline Phos 38 - 126 U/L - - -  AST 15 - 41 U/L - - -  ALT 0 - 44 U/L - - -    Hepatic Function Latest Ref Rng & Units 02/28/2021 02/25/2021  Total Protein 6.5 - 8.1 g/dL - 7.3  Albumin 3.5 - 5.0 g/dL 2.4(L) 4.0  AST 15 - 41 U/L - 13(L)  ALT 0 - 44 U/L - 20  Alk Phosphatase 38 - 126 U/L - 86  Total Bilirubin 0.3 - 1.2 mg/dL - 1.0    Gastric biopsy results will be available this afternoon.   Assessment:  #1.  Gastric outlet obstruction.   Patient underwent EGD yesterday revealing high-grade distal bulbar stricture which could not be traversed even with ultraslim scope.  He also had 2 large gastric ulcers.  He is tolerating liquids.  It is possible that stricture would open up as inflammation goes down.  He will be restudied on 03/02/2021.  He may need fluoroscopy and contrast injection if still unable to traverse the stricture before dilation.  We will stop heparin 6 hours before the procedure. ZE syndrome raised as a possibility since he has history of pancreatic lesions which were discovered 2-1/2 years ago and has remained stable.  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  Therefore would be reasonable to check gastrin level.  Gastrin level is very high and may not be very helpful as I expected to be elevated in the setting of gastric outlet obstruction.  #2.  Acute kidney injury.  Continued improvement in renal function with IV fluids.  #3.  Hypokalemia.  Hypokalemia has not corrected with IV supplement.  He must have a large deficit in he still having diarrhea so he is still losing electrolytes.  #4.  Acute diarrhea.  Stool studies are negative.  #5.  Diabetes mellitus.  Patient is on combination of long-acting insulin as well as mealtime coverage.   Recommendations  Advance diet to full liquids along with Glucerna in between meals. Fasting gastrin level. CBC and metabolic 7 in AM.

## 2021-02-28 NOTE — Plan of Care (Signed)
  Problem: Education: Goal: Knowledge of General Education information will improve Description Including pain rating scale, medication(s)/side effects and non-pharmacologic comfort measures Outcome: Progressing   

## 2021-03-01 LAB — GLUCOSE, CAPILLARY
Glucose-Capillary: 90 mg/dL (ref 70–99)
Glucose-Capillary: 112 mg/dL — ABNORMAL HIGH (ref 70–99)
Glucose-Capillary: 96 mg/dL (ref 70–99)
Glucose-Capillary: 104 mg/dL — ABNORMAL HIGH (ref 70–99)

## 2021-03-01 LAB — BASIC METABOLIC PANEL
Anion gap: 5 (ref 5–15)
BUN: 18 mg/dL (ref 8–23)
CO2: 27 mmol/L (ref 22–32)
Calcium: 8.1 mg/dL — ABNORMAL LOW (ref 8.9–10.3)
Chloride: 108 mmol/L (ref 98–111)
Creatinine, Ser: 1.08 mg/dL (ref 0.61–1.24)
GFR, Estimated: >60 mL/min (ref >60)
Glucose, Bld: 101 mg/dL — ABNORMAL HIGH (ref 70–99)
Potassium: 3.3 mmol/L — ABNORMAL LOW (ref 3.5–5.1)
Sodium: 140 mmol/L (ref 135–145)

## 2021-03-01 LAB — MAGNESIUM: Magnesium: 2.2 mg/dL (ref 1.7–2.4)

## 2021-03-01 MED ORDER — POTASSIUM CHLORIDE CRYS ER 20 MEQ PO TBCR
40.0000 meq | EXTENDED_RELEASE_TABLET | Freq: Once | ORAL | Status: AC
  Administered 2021-03-01: 40 meq via ORAL
  Filled 2021-03-01: qty 2

## 2021-03-01 MED ORDER — POTASSIUM CHLORIDE CRYS ER 20 MEQ PO TBCR
20.0000 meq | EXTENDED_RELEASE_TABLET | Freq: Every day | ORAL | Status: DC
  Administered 2021-03-01: 20 meq via ORAL
  Filled 2021-03-01: qty 2

## 2021-03-01 NOTE — Progress Notes (Signed)
Subjective:  Patient states he is tolerating full liquids.  He has not experienced nausea vomiting heartburn chest pain or abdominal pain.  Stool frequency has decreased.  He says stool is now turning brown and thicker than before.  He denies melena or rectal bleeding. He complains of pain at right ankle.  He states he did walk 6 times to the nursing station and back last night.  Current Medications:  Current Facility-Administered Medications:    0.9 % NaCl with KCl 20 mEq/ L  infusion, , Intravenous, Continuous, Johnson, Clanford L, MD, Last Rate: 50 mL/hr at 03/01/21 0800, Rate Change at 03/01/21 0800   acetaminophen (TYLENOL) tablet 650 mg, 650 mg, Oral, Q6H PRN, 650 mg at 02/28/21 2049 **OR** acetaminophen (TYLENOL) suppository 650 mg, 650 mg, Rectal, Q6H PRN, Elgergawy, Silver Huguenin, MD   feeding supplement (GLUCERNA SHAKE) (GLUCERNA SHAKE) liquid 237 mL, 237 mL, Oral, BID BM, Aika Brzoska U, MD, 237 mL at 03/01/21 1022   finasteride (PROSCAR) tablet 5 mg, 5 mg, Oral, Daily, Elgergawy, Silver Huguenin, MD, 5 mg at 03/01/21 1021   heparin injection 5,000 Units, 5,000 Units, Subcutaneous, Q8H, Johnson, Clanford L, MD, 5,000 Units at 03/01/21 0553   insulin aspart (novoLOG) injection 0-9 Units, 0-9 Units, Subcutaneous, TID WC, Johnson, Clanford L, MD   insulin detemir (LEVEMIR) injection 10 Units, 10 Units, Subcutaneous, Daily, Elgergawy, Silver Huguenin, MD, 10 Units at 03/01/21 1021   latanoprost (XALATAN) 0.005 % ophthalmic solution 1 drop, 1 drop, Both Eyes, Daily, Elgergawy, Silver Huguenin, MD, 1 drop at 02/28/21 2050   metoCLOPramide (REGLAN) injection 5 mg, 5 mg, Intravenous, Q6H PRN, Elgergawy, Silver Huguenin, MD, 5 mg at 02/26/21 0209   ondansetron (ZOFRAN) injection 4 mg, 4 mg, Intravenous, Once, Elgergawy, Silver Huguenin, MD   pantoprazole (PROTONIX) injection 40 mg, 40 mg, Intravenous, Q12H, Shaday Rayborn U, MD, 40 mg at 03/01/21 1021   potassium chloride SA (KLOR-CON) CR tablet 20 mEq, 20 mEq, Oral, QHS, Johnson,  Clanford L, MD   traZODone (DESYREL) tablet 50 mg, 50 mg, Oral, QHS PRN, Reubin Milan, MD, 50 mg at 02/26/21 0231  Objective: Blood pressure 133/69, pulse 77, temperature 97.9 F (36.6 C), temperature source Oral, resp. rate 18, height $RemoveBe'6\' 3"'[id number]$  (1.905 m), weight 104.6 kg, SpO2 98 %. Patient is alert and in no acute distress. Cardiac exam with regular rhythm normal S1 and S2. No murmur or gallop noted. Lungs are clear to auscultation. Abdomen is full.  Bowel sounds are hyperactive.  No succussion splash noted in epigastric region.  On palpation abdomen is soft and nontender with organomegaly or masses. Lower extremity edema remains unchanged.  Labs/studies Results:   CBC Latest Ref Rng & Units 02/27/2021 02/26/2021 02/25/2021  WBC 4.0 - 10.5 K/uL 13.2(H) 19.6(H) 23.4(H)  Hemoglobin 13.0 - 17.0 g/dL 14.6 14.6 16.4  Hematocrit 39.0 - 52.0 % 43.3 43.3 49.4  Platelets 150 - 400 K/uL 280 262 357    CMP Latest Ref Rng & Units 03/01/2021 02/28/2021 02/27/2021  Glucose 70 - 99 mg/dL 101(H) 84 103(H)  BUN 8 - 23 mg/dL 18 34(H) 49(H)  Creatinine 0.61 - 1.24 mg/dL 1.08 1.32(H) 1.82(H)  Sodium 135 - 145 mmol/L 140 137 136  Potassium 3.5 - 5.1 mmol/L 3.3(L) 2.9(L) 3.2(L)  Chloride 98 - 111 mmol/L 108 105 110  CO2 22 - 32 mmol/L 27 25 19(L)  Calcium 8.9 - 10.3 mg/dL 8.1(L) 7.5(L) 7.8(L)  Total Protein 6.5 - 8.1 g/dL - - -  Total Bilirubin 0.3 -  1.2 mg/dL - - -  Alkaline Phos 38 - 126 U/L - - -  AST 15 - 41 U/L - - -  ALT 0 - 44 U/L - - -    Hepatic Function Latest Ref Rng & Units 02/28/2021 02/25/2021  Total Protein 6.5 - 8.1 g/dL - 7.3  Albumin 3.5 - 5.0 g/dL 2.4(L) 4.0  AST 15 - 41 U/L - 13(L)  ALT 0 - 44 U/L - 20  Alk Phosphatase 38 - 126 U/L - 86  Total Bilirubin 0.3 - 1.2 mg/dL - 1.0    Gastric biopsy results reviewed with Dr. Gregary Signs many yesterday afternoon. Benign gastric ulcers without any specific features to suggest ischemic injury or infection.  H. pylori stains are pending.  Gastrin  level is pending.   Assessment:  #1.  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  Unable to negotiate ultraslim scope across it.  Therefore dilation not possible.  He has been on full liquids for 24 hours and not having any difficulty.  Therefore liquids are going past the stricture.  Etiology felt to be benign i.e. peptic ulcer disease.  Doubt other etiologies such as celiac disease. Patient will undergo repeat EGD tomorrow with an intention to dilate duodenal stricture with or without fluoroscopy depending on endoscopic findings.  It remains to be seen if stricture diameter is improved with IV PPI.  #2.  Gastric ulcers.  He had 2 large gastric ulcers appear to be superficial.  Biopsies are nonrevealing.  Biopsy not consistent with ischemic injury.  H. pylori stains and gastrin levels are pending.  #3.  Acute kidney injury.  Injury has reversed with hydration.  #4.  Hypokalemia.  Hypokalemia improving with therapy.  #5.  Acute diarrhea.  Stool studies are negative.  Stool frequency is decreasing.  #5.  Diabetes mellitus.  Patient is on combination of long-acting insulin as well as mealtime coverage.  Recommendations  Esophagogastroduodenoscopy with duodenal stricture dilation under fluoroscopy. Hold heparin after 6 AM dose tomorrow.

## 2021-03-01 NOTE — Progress Notes (Signed)
PROGRESS NOTE    Bryan Vargas  KYH:[id number] DOB: 30-May-1951 DOA: 02/25/2021 PCP: Quentin Cornwall, MD  Outpatient Specialists: Irine Seal MD (Urology)   Brief Narrative:  70 y.o. male, with past medical history of diabetes mellitus, BPH, presents to ED for evaluation of dehydration due to excessive diarrhea, patient had an ED visit 8 days ago, complaining of constipation, where he was started on MiraLAX, he reports he was taking MiraLAX twice daily until this Monday, he has been having multiple loose bowel movements per day, up to 20, does report over last 24 hours he did have some nausea, vomiting, denies any fever, chills, coffee-ground emesis, bright red blood per rectum, report he had screening colonoscopy at age 33, 73, at [facility], and report he was establishing with [facility] in [place], where he is supposed to have another screening colonoscopy next month, he does report history of BPH, for which he is following with at 134, he had white blood cell count of 23, hemoglobin at 16.4, urinalysis was significant for white blood cell count>50, and red blood cell 21-50, CT abdomen significant for markedly distended stomach, and lesions in the pancreatic tail, [facility] consulted to admit.   Since admission, patient has had uncontrolled diarrhea. GIP, C diff negative. Urine culture negative. Patient has received 2 doses of rocephin and should receive last dose 7/8. Leukocytosis has improved today and patient remains hemodynamically stable.  Gastroenterology was consulted and evaluated patient this hospitalization; recommends EGD, to which patient agreed. EGD was performed by Dr. Laural Golden 7/8.   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 Patient also has stable BPH and is treated with finasteride and flomax for 6 months.   Assessment & Plan:   Principal Problem:   UTI (urinary tract infection) Active Problems:   AKI  (acute kidney injury) ([place])   Diabetes mellitus (HCC)   Dehydration   Gastric dilatation   GERD (gastroesophageal reflux disease)   Diarrhea   Hypokalemia   Leukocytosis   Metabolic acidosis   Non-intractable vomiting  AKI - Pt had a creatinine of 1.00 in Feb 2022.  He is likely prerenal given severe recent diarrhea.  He is on IV fluids.  He is doing much better and creatinine improving with IV fluids.  - AKI - Improved with IV fluid hydration - Renal US 7/7 showed atrophic R kidney, multiple cysts on L kidney but no signs of obstructive hydronephrosis.    Metabolic acidosis - resolved now after starting sodium bicarbonate IV fluids.  Changed fluids to LR. Reduce rate of IV fluid today.    Hypokalemia/ Hypomagnesemia - Mag was 2.2.  Additional potassium replacement was ordered today, recheck in AM.    UTI - TREATED.  U/A obtained in ED showed WBC >50. Urine culture negative after 48 hours. Patient was not on antibiotics recently prior to admission. Patient completed 3 doses of rocephin on 7/8.    Gastric outlet obstruction - Continue full liquid diet and Dr. Laural Golden planning for repeat EGD on 7/11.  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    Multiple gastric ulcers -  Pt remains on IV pantoprazole BID.   Nausea and vomiting  - Improved to Resolved.  Secondary to EGD proven gastric outlet obstruction.  Discussed with Dr. Laural Golden and he is planning to attempt to dilate again on Monday. At this time he remains on full liquid diet.  Diarrhea - multifactorial. C diff was negative.  GI path panel negative. Patient continues to have loose stool.    Pancreatic lesions - Pt reporting that he is receiving close follow by his GI in [place] and reports the lesions have been stable. No inpatient imaging is recommended at this time, per GI   Type 2 diabetes mellitus - A1c 5.8% which is evidence of optimal glycemic control, continue levemir and SSI coverage  with frequent CBG monitoring.     BPH - stable on proscar therapy. Following with outpatient urology    DVT prophylaxis: SQ heparin  Code Status: full  Family Communication: plan of care discussed with patient at bedside, who verbalized understanding Disposition: home Status is: Inpatient   Remains inpatient appropriate because:IV treatments appropriate due to intensity of illness or inability to take PO and Inpatient level of care appropriate due to severity of illness   Dispo: The patient is from: Home              Anticipated d/c is to: Home              Patient currently is not medically stable to d/c.              Difficult to place patient No   Consultants:  GI    Procedures:  Esophagoduodenoscopy    Antimicrobials:  Ceftriaxone 7/6>> 7/8  Subjective: Patient without any specific complaints today, tolerating full liquid diet.     Objective: Vitals:   02/28/21 0541 02/28/21 1328 02/28/21 2043 03/01/21 0603  BP: (!) 95/54 (!) 91/56 110/61 133/69  Pulse: 81 86 83 77  Resp: 18 18 19 18   Temp: 98.2 F (36.8 C) 97.8 F (36.6 C) 98.3 F (36.8 C) 97.9 F (36.6 C)  TempSrc: Oral Oral Oral Oral  SpO2: 98% 100% 98% 98%  Weight:      Height:        Intake/Output Summary (Last 24 hours) at 03/01/2021 1213 Last data filed at 03/01/2021 0900 Gross per 24 hour  Intake 1405.98 ml  Output --  Net 1405.98 ml   Filed Weights   02/25/21 1258 02/26/21 0748  Weight: 102.5 kg 104.6 kg   Examination:  General exam: obese male, awake, alert, cooperative, Appears calm and comfortable  Respiratory system: Clear to auscultation. Respiratory effort normal. Cardiovascular system: normal S1 & S2 heard.  No JVD, murmurs, rubs, gallops or clicks. No pedal edema. Gastrointestinal system: Abdomen is softer and nontender, though still distended. Positive bowel sound.  Central nervous system: Alert and oriented. No focal neurological deficits. Extremities: Symmetric 5 x 5 power. Skin:  No rashes, lesions or ulcers.  Psychiatry: Judgement and insight appear normal. Mood & affect appropriate.   Data Reviewed: I have personally reviewed following labs and imaging studies  CBC: Recent Labs  Lab 02/25/21 1315 02/26/21 0436 02/27/21 0503  WBC 23.4* 19.6* 13.2*  NEUTROABS 19.5*  --  9.4*  HGB 16.4 14.6 14.6  HCT 49.4 43.3 43.3  MCV 97.2 93.5 92.9  PLT 357 262 250   Basic Metabolic Panel: Recent Labs  Lab 02/25/21 1315 02/26/21 0436 02/27/21 0503 02/28/21 0358 03/01/21 0638  NA 134* 134* 136 137 140  K 3.7 3.3* 3.2* 2.9* 3.3*  CL 107 110 110 105 108  CO2 16* 15* 19* 25 27  GLUCOSE 192* 159* 103* 84 101*  BUN 37* 45* 49* 34* 18  CREATININE 2.62* 2.24* 1.82* 1.32* 1.08  CALCIUM 9.6 8.5* 7.8* 7.5* 8.1*  MG 1.9  --  1.5* 2.2 2.2   GFR: Estimated Creatinine Clearance: 84.5 mL/min (by C-G formula based on SCr of 1.08 mg/dL). Liver Function Tests: Recent Labs  Lab 02/25/21 1315 02/28/21 0358  AST 13*  --   ALT 20  --   ALKPHOS 86  --   BILITOT 1.0  --   PROT 7.3  --   ALBUMIN 4.0 2.4*   No results for input(s): LIPASE, AMYLASE in the last 168 hours. No results for input(s): AMMONIA in the last 168 hours. Coagulation Profile: Recent Labs  Lab 02/26/21 0436  INR 1.2   Cardiac Enzymes: No results for input(s): CKTOTAL, CKMB, CKMBINDEX, TROPONINI in the last 168 hours. BNP (last 3 results) No results for input(s): PROBNP in the last 8760 hours. HbA1C: Recent Labs    02/27/21 0503  HGBA1C 5.8*   CBG: Recent Labs  Lab 02/28/21 1148 02/28/21 1621 02/28/21 2039 03/01/21 0738 03/01/21 1145  GLUCAP 98 109* 100* 90 112*   Lipid Profile: No results for input(s): CHOL, HDL, LDLCALC, TRIG, CHOLHDL, LDLDIRECT in the last 72 hours. Thyroid Function Tests: Recent Labs    02/27/21 0838  TSH 2.405   Anemia Panel: No results for input(s): VITAMINB12, FOLATE, FERRITIN, TIBC, IRON, RETICCTPCT in the last 72 hours. Urine analysis:    Component  Value Date/Time   COLORURINE AMBER (A) 02/25/2021 1424   APPEARANCEUR CLOUDY (A) 02/25/2021 1424   APPEARANCEUR Clear 01/15/2021 1534   LABSPEC 1.019 02/25/2021 1424   PHURINE 5.0 02/25/2021 1424   GLUCOSEU NEGATIVE 02/25/2021 1424   HGBUR SMALL (A) 02/25/2021 1424   BILIRUBINUR SMALL (A) 02/25/2021 1424   BILIRUBINUR Negative 01/15/2021 1534   KETONESUR NEGATIVE 02/25/2021 1424   PROTEINUR 100 (A) 02/25/2021 1424   NITRITE NEGATIVE 02/25/2021 1424   LEUKOCYTESUR LARGE (A) 02/25/2021 1424   Recent Results (from the past 240 hour(s))  Resp Panel by RT-PCR (Flu A&B, Covid) Nasopharyngeal Swab     Status: None   Collection Time: 02/25/21  2:54 PM   Specimen: Nasopharyngeal Swab; Nasopharyngeal(NP) swabs in vial transport medium  Result Value Ref Range Status   SARS Coronavirus 2 by RT PCR NEGATIVE NEGATIVE Final    Comment: (NOTE) SARS-CoV-2 target nucleic acids are NOT DETECTED.  The SARS-CoV-2 RNA is generally detectable in upper respiratory specimens during the acute phase of infection. The lowest concentration of SARS-CoV-2 viral copies this assay can detect is 138 copies/mL. A negative result does not preclude SARS-Cov-2 infection and should not be used as the sole basis for treatment or other patient management decisions. A negative result may occur with  improper specimen collection/handling, submission of specimen other than nasopharyngeal swab, presence of viral mutation(s) within the areas targeted by this assay, and inadequate number of viral copies(<138 copies/mL). A negative result must be combined with clinical observations, patient history, and epidemiological information. The expected result is Negative.  Fact Sheet for Patients:  EntrepreneurPulse.com.au  Fact Sheet for Healthcare Providers:  IncredibleEmployment.be  This test is no t yet approved or cleared by the [place] FDA and  has been authorized for detection  and/or diagnosis of SARS-CoV-2 by FDA under an Emergency Use Authorization (EUA). This EUA will remain  in effect (meaning this test can be used) for the duration of the COVID-19 declaration under Section 564(b)(1) of the Act, 21 U.S.C.section 360bbb-3(b)(1), unless the authorization is terminated  or revoked sooner.       Influenza A by PCR NEGATIVE NEGATIVE Final   Influenza B by PCR NEGATIVE NEGATIVE  Final    Comment: (NOTE) The Xpert Xpress SARS-CoV-2/FLU/RSV plus assay is intended as an aid in the diagnosis of influenza from Nasopharyngeal swab specimens and should not be used as a sole basis for treatment. Nasal washings and aspirates are unacceptable for Xpert Xpress SARS-CoV-2/FLU/RSV testing.  Fact Sheet for Patients: EntrepreneurPulse.com.au  Fact Sheet for Healthcare Providers: IncredibleEmployment.be  This test is not yet approved or cleared by the [place] FDA and has been authorized for detection and/or diagnosis of SARS-CoV-2 by FDA under an Emergency Use Authorization (EUA). This EUA will remain in effect (meaning this test can be used) for the duration of the COVID-19 declaration under Section 564(b)(1) of the Act, 21 U.S.C. section 360bbb-3(b)(1), unless the authorization is terminated or revoked.  Performed at [facility], [address], [place], [place] [address]   Urine culture     Status: None   Collection Time: 02/25/21  3:17 PM   Specimen: Urine, Clean Catch  Result Value Ref Range Status   Specimen Description   Final    URINE, CLEAN CATCH Performed at [facility], [address], [place], [place] [address]    Special Requests   Final    NONE Performed at [facility], [address] [place], [place] [address]    Culture   Final    NO GROWTH Performed at [facility], [place] [address] [place], [place] [address]    Report Status 02/27/2021 FINAL  Final  C Difficile Quick Screen w PCR reflex      Status: None   Collection Time: 02/25/21  4:31 PM   Specimen: STOOL  Result Value Ref Range Status   C Diff antigen NEGATIVE NEGATIVE Final   C Diff toxin NEGATIVE NEGATIVE Final   C Diff interpretation No C. difficile detected.  Final    Comment: Performed at [facility], [address], [place], [place] [address]  Gastrointestinal Panel by PCR , Stool     Status: None   Collection Time: 02/25/21  4:31 PM   Specimen: STOOL  Result Value Ref Range Status   Campylobacter species NOT DETECTED NOT DETECTED Final   Plesimonas shigelloides NOT DETECTED NOT DETECTED Final   Salmonella species NOT DETECTED NOT DETECTED Final   Yersinia enterocolitica NOT DETECTED NOT DETECTED Final   Vibrio species NOT DETECTED NOT DETECTED Final   Vibrio cholerae NOT DETECTED NOT DETECTED Final   Enteroaggregative E coli (EAEC) NOT DETECTED NOT DETECTED Final   Enteropathogenic E coli (EPEC) NOT DETECTED NOT DETECTED Final   Enterotoxigenic E coli (ETEC) NOT DETECTED NOT DETECTED Final   Shiga like toxin producing E coli (STEC) NOT DETECTED NOT DETECTED Final   Shigella/Enteroinvasive E coli (EIEC) NOT DETECTED NOT DETECTED Final   Cryptosporidium NOT DETECTED NOT DETECTED Final   Cyclospora cayetanensis NOT DETECTED NOT DETECTED Final   Entamoeba histolytica NOT DETECTED NOT DETECTED Final   Giardia lamblia NOT DETECTED NOT DETECTED Final   Adenovirus F40/41 NOT DETECTED NOT DETECTED Final   Astrovirus NOT DETECTED NOT DETECTED Final   Norovirus GI/GII NOT DETECTED NOT DETECTED Final   Rotavirus A NOT DETECTED NOT DETECTED Final   Sapovirus (I, II, IV, and V) NOT DETECTED NOT DETECTED Final    Comment: Performed at [facility], [address] [place], [place] [address]  Blood culture (routine x 2)     Status: None (Preliminary result)   Collection Time: 02/25/21  6:00 PM   Specimen: Left Antecubital; Blood  Result Value  Ref Range Status   Specimen Description LEFT ANTECUBITAL  Final    Special Requests   Final    BOTTLES DRAWN AEROBIC AND ANAEROBIC Blood Culture adequate volume   Culture   Final    NO GROWTH 3 DAYS Performed at [facility], [address], [place], [place] [address]    Report Status PENDING  Incomplete  Blood culture (routine x 2)     Status: None (Preliminary result)   Collection Time: 02/25/21  6:15 PM   Specimen: Left Antecubital; Blood  Result Value Ref Range Status   Specimen Description LEFT ANTECUBITAL  Final   Special Requests   Final    BOTTLES DRAWN AEROBIC AND ANAEROBIC Blood Culture adequate volume   Culture   Final    NO GROWTH 3 DAYS Performed at [facility], [address], [place], [place] [address]    Report Status PENDING  Incomplete    Radiology Studies: No results found.   Scheduled Meds:  feeding supplement (GLUCERNA SHAKE)  237 mL Oral BID BM   finasteride  5 mg Oral Daily   heparin  5,000 Units Subcutaneous Q8H   insulin aspart  0-9 Units Subcutaneous TID WC   insulin detemir  10 Units Subcutaneous Daily   latanoprost  1 drop Both Eyes Daily   ondansetron  4 mg Intravenous Once   pantoprazole (PROTONIX) IV  40 mg Intravenous Q12H   potassium chloride  20 mEq Oral QHS   Continuous Infusions:  0.9 % NaCl with KCl 20 mEq / L 50 mL/hr at 03/01/21 0800    LOS: 4 days   Time spent: 35 minutes  Melysa Schroyer Wynetta Emery, MD How to contact the [facility] Attending or Consulting provider [place] or covering provider during after hours [place], for this patient?  Check the care team in [facility] and look for a) attending/consulting [facility] provider listed and b) the [facility] team listed Log into [email or web address] and use [place]'s universal password to access. If you do not have the password, please contact the hospital operator. Locate the [facility] provider you are looking for under [facility] and page to a number that you can be directly reached. If you still have difficulty reaching the provider, please page the [facility] (Director on Call) for the  Hospitalists listed on amion for assistance.  [facility]

## 2021-03-02 ENCOUNTER — Inpatient Hospital Stay (HOSPITAL_COMMUNITY): Payer: Medicare Other | Admitting: Certified Registered"

## 2021-03-02 ENCOUNTER — Encounter (HOSPITAL_COMMUNITY): Payer: Self-pay | Admitting: Internal Medicine

## 2021-03-02 ENCOUNTER — Inpatient Hospital Stay (HOSPITAL_COMMUNITY): Payer: Medicare Other

## 2021-03-02 ENCOUNTER — Encounter (HOSPITAL_COMMUNITY)
Admission: EM | Disposition: A | Discharge status: Home / Self Care | Payer: Self-pay | Source: Home / Self Care | Attending: Family Medicine

## 2021-03-02 HISTORY — PX: BIOPSY: SHX5522

## 2021-03-02 HISTORY — PX: BALLOON DILATION: SHX5330

## 2021-03-02 HISTORY — PX: ESOPHAGOGASTRODUODENOSCOPY (EGD) WITH PROPOFOL: SHX5813

## 2021-03-02 LAB — GLUCOSE, CAPILLARY
Glucose-Capillary: 87 mg/dL (ref 70–99)
Glucose-Capillary: 94 mg/dL (ref 70–99)
Glucose-Capillary: 70 mg/dL (ref 70–99)
Glucose-Capillary: 76 mg/dL (ref 70–99)
Glucose-Capillary: 116 mg/dL — ABNORMAL HIGH (ref 70–99)

## 2021-03-02 LAB — CULTURE, BLOOD (ROUTINE X 2)
Culture: NO GROWTH
Culture: NO GROWTH
Special Requests: ADEQUATE
Special Requests: ADEQUATE

## 2021-03-02 LAB — SURGICAL PATHOLOGY

## 2021-03-02 SURGERY — ESOPHAGOGASTRODUODENOSCOPY (EGD) WITH PROPOFOL
Anesthesia: General

## 2021-03-02 MED ORDER — PHENYLEPHRINE HCL (PRESSORS) 10 MG/ML IV SOLN
INTRAVENOUS | Status: DC | PRN
  Administered 2021-03-02: 80 ug via INTRAVENOUS

## 2021-03-02 MED ORDER — SODIUM CHLORIDE 0.9 % IV SOLN
INTRAVENOUS | Status: DC | PRN
  Administered 2021-03-02: 30 mL

## 2021-03-02 MED ORDER — SUCCINYLCHOLINE CHLORIDE 200 MG/10ML IV SOSY
PREFILLED_SYRINGE | INTRAVENOUS | Status: AC
  Filled 2021-03-02: qty 10

## 2021-03-02 MED ORDER — FENTANYL CITRATE (PF) 100 MCG/2ML IJ SOLN
INTRAMUSCULAR | Status: AC
  Filled 2021-03-02: qty 2

## 2021-03-02 MED ORDER — ORAL CARE MOUTH RINSE: 15.0000 mL | Freq: Once | OROMUCOSAL | Status: DC

## 2021-03-02 MED ORDER — LACTATED RINGERS IV SOLN: INTRAVENOUS | Status: DC

## 2021-03-02 MED ORDER — ONDANSETRON HCL 4 MG/2ML IJ SOLN
INTRAMUSCULAR | Status: DC | PRN
  Administered 2021-03-02: 4 mg via INTRAVENOUS

## 2021-03-02 MED ORDER — LIDOCAINE HCL (PF) 2 % IJ SOLN
INTRAMUSCULAR | Status: AC
  Filled 2021-03-02: qty 5

## 2021-03-02 MED ORDER — CHLORHEXIDINE GLUCONATE 0.12 % MT SOLN: 15.0000 mL | Freq: Once | OROMUCOSAL | Status: DC

## 2021-03-02 MED ORDER — SUCCINYLCHOLINE CHLORIDE 20 MG/ML IJ SOLN
INTRAMUSCULAR | Status: DC | PRN
  Administered 2021-03-02: 120 mg via INTRAVENOUS

## 2021-03-02 MED ORDER — ONDANSETRON HCL 4 MG/2ML IJ SOLN
INTRAMUSCULAR | Status: AC
  Filled 2021-03-02: qty 4

## 2021-03-02 MED ORDER — LIDOCAINE 2% (20 MG/ML) 5 ML SYRINGE
INTRAMUSCULAR | Status: DC | PRN
  Administered 2021-03-02: 60 mg via INTRAVENOUS

## 2021-03-02 MED ORDER — PROPOFOL 10 MG/ML IV BOLUS
INTRAVENOUS | Status: DC | PRN
  Administered 2021-03-02: 150 mg via INTRAVENOUS
  Administered 2021-03-02: 50 mg via INTRAVENOUS

## 2021-03-02 MED ORDER — LACTATED RINGERS IV SOLN: INTRAVENOUS | Status: DC | PRN

## 2021-03-02 MED ORDER — SODIUM CHLORIDE FLUSH 0.9 % IV SOLN
INTRAVENOUS | Status: AC
  Filled 2021-03-02: qty 10

## 2021-03-02 MED ORDER — PHENYLEPHRINE 40 MCG/ML (10ML) SYRINGE FOR IV PUSH (FOR BLOOD PRESSURE SUPPORT)
PREFILLED_SYRINGE | INTRAVENOUS | Status: AC
  Filled 2021-03-02: qty 20

## 2021-03-02 MED ORDER — FENTANYL CITRATE (PF) 100 MCG/2ML IJ SOLN
INTRAMUSCULAR | Status: DC | PRN
  Administered 2021-03-02: 50 ug via INTRAVENOUS

## 2021-03-02 MED ORDER — SODIUM CHLORIDE 0.9 % IV SOLN
INTRAVENOUS | Status: AC
  Filled 2021-03-02: qty 50

## 2021-03-02 SURGICAL SUPPLY — 20 items
BLOCK BITE 60FR ADLT L/F BLUE (MISCELLANEOUS) IMPLANT
DEVICE CLIP HEMOSTAT 235CM (CLIP) IMPLANT
ELECT REM PT RETURN 9FT ADLT (ELECTROSURGICAL)
ELECTRODE REM PT RTRN 9FT ADLT (ELECTROSURGICAL) IMPLANT
FLOOR PAD 36X40 (MISCELLANEOUS)
FORCEPS BIOP RAD 4 LRG CAP 4 (CUTTING FORCEPS) IMPLANT
FORMALIN 10 PREFIL 20ML (MISCELLANEOUS) IMPLANT
KIT ENDO PROCEDURE PEN (KITS) ×3 IMPLANT
MANIFOLD NEPTUNE II (INSTRUMENTS) IMPLANT
NEEDLE SCLEROTHERAPY 25GX240 (NEEDLE) IMPLANT
PAD FLOOR 36X40 (MISCELLANEOUS) IMPLANT
PROBE APC STR FIRE (PROBE) IMPLANT
PROBE INJECTION GOLD (MISCELLANEOUS)
PROBE INJECTION GOLD 7FR (MISCELLANEOUS) IMPLANT
SNARE ROTATE MED OVAL 20MM (MISCELLANEOUS) IMPLANT
SNARE SHORT THROW 13M SML OVAL (MISCELLANEOUS) ×3 IMPLANT
SYR INFLATION 60ML (SYRINGE) ×3 IMPLANT
TUBING INSUFFLATOR CO2MPACT (TUBING) ×3 IMPLANT
TUBING IRRIGATION ENDOGATOR (MISCELLANEOUS) IMPLANT
WATER STERILE IRR 1000ML POUR (IV SOLUTION) IMPLANT

## 2021-03-02 NOTE — Anesthesia Procedure Notes (Signed)
Procedure Name: Intubation Date/Time: 03/02/2021 1:50 PM Performed by: Riki Sheer, CRNA Pre-anesthesia Checklist: Patient identified, Emergency Drugs available, Suction available, Patient being monitored and Timeout performed Patient Re-evaluated:Patient Re-evaluated prior to induction Oxygen Delivery Method: Circle system utilized Preoxygenation: Pre-oxygenation with 100% oxygen Induction Type: IV induction and Rapid sequence Laryngoscope Size: Glidescope and 4 Grade View: Grade I Tube type: Oral Tube size: 7.5 mm Number of attempts: 1 Airway Equipment and Method: Video-laryngoscopy Placement Confirmation: ETT inserted through vocal cords under direct vision, positive ETCO2, CO2 detector and breath sounds checked- equal and bilateral Secured at: 23 cm Tube secured with: Tape Dental Injury: Teeth and Oropharynx as per pre-operative assessment

## 2021-03-02 NOTE — Progress Notes (Signed)
PROGRESS NOTE    Bryan Vargas  NAT:[id number] DOB: [birth year] DOA: 02/25/2021 PCP: Quentin Cornwall, MD      Outpatient Specialists: Irine Seal MD (Urology)  Brief Narrative:   70 y.o. male, with past medical history of diabetes mellitus, BPH, presents to ED for evaluation of dehydration due to excessive diarrhea, patient had an ED visit 8 days ago, complaining of constipation, where he was started on MiraLAX, he reports he was taking MiraLAX twice daily until this Monday, he has been having multiple loose bowel movements per day, up to 20, does report over last 24 hours he did have some nausea, vomiting, denies any fever, chills, coffee-ground emesis, bright red blood per rectum, report he had screening colonoscopy at age 11, 68, at [facility], and report he was establishing with [facility] in [place], where he is supposed to have another screening colonoscopy next month, he does report history of BPH, for which he is following with at 134, he had white blood cell count of 23, hemoglobin at 16.4, urinalysis was significant for white blood cell count>50, and red blood cell 21-50, CT abdomen significant for markedly distended stomach, and lesions in the pancreatic tail, [facility] consulted to admit.   Since admission, patient has had uncontrolled diarrhea. GIP, C diff negative. Urine culture negative. Patient has received 2 doses of rocephin and should receive last dose 7/8. Leukocytosis has improved today and patient remains hemodynamically stable.  Gastroenterology was consulted and evaluated patient this hospitalization; recommends EGD, to which patient agreed. EGD was performed by Dr. 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 Patient also has stable BPH and is treated with finasteride and flomax for 6 months.  Patient undergoes EGD 7/11 for  attempts at stricture dilation.  Assessment & Plan:   Principal Problem:   UTI (urinary tract infection) Active Problems:   AKI (acute kidney injury) ([place])   Diabetes mellitus (HCC)   Dehydration   Gastric dilatation   GERD (gastroesophageal reflux disease)   Diarrhea   Hypokalemia   Leukocytosis   Metabolic acidosis   Non-intractable vomiting  AKI -  RESOLVED now. Pt had a creatinine of 1.00 in Feb 2022. Likely due to dehydration from diarrhea. Trending down and normal today 7/11 (Cr 1.08). Patient states he has not been able to urinate.  - Bladder scan ordered to evaluate for retention - Renal US 7/7 showed atrophic R kidney, multiple cysts on L kidney but no signs of obstructive hydronephrosis.   Metabolic acidosis - resolved now after starting sodium bicarbonate IV fluids.  Changed fluids to LR. Reduce rate of IV fluid today.    Hypokalemia/ Hypomagnesemia - Mag was 2.2.  Additional potassium replacement was ordered today, recheck in AM.   UTI - TREATED.  U/A obtained in ED showed WBC >50. Urine culture negative after 48 hours. Patient was not on antibiotics recently prior to admission. Patient completed 3 doses of rocephin on 7/8.   Gastric outlet obstruction - Continue full liquid diet and Dr. Laural Golden planning for repeat EGD on 7/11.  He is being investigated for possible ZE syndrome with gastrin level testing - Surgical pathology of biopsy was benign for both specimen; Wathrin negative for H pylori infection.  - [ ]  f/u pending gastric level and celiac serologies   Multiple gastric ulcers -  Pt remains on IV pantoprazole BID.  Nausea and vomiting  - Improved to Resolved.  Secondary to EGD proven gastric outlet obstruction.  Discussed with Dr. Laural Golden and he is planning to attempt to dilate again on Monday. At this time he remains on full liquid diet.    Diarrhea - multifactorial. C diff was negative.  GI path panel negative. Patient continues to have loose stool.    Pancreatic lesions - Pt reporting that he is receiving close follow by his GI in [place] and reports the lesions have been stable. No inpatient imaging is recommended at this time, per GI   Type 2 diabetes mellitus - A1c 5.8% which is evidence of optimal glycemic control, continue levemir and SSI coverage with frequent CBG monitoring.     BPH - stable on proscar therapy. Following with outpatient urology    DVT prophylaxis: SQ heparin - last dose at 0548 and hold currently  Code Status: full  Family Communication: plan of care discussed with patient at bedside, who verbalized understanding Disposition: home Status is: Inpatient   Remains inpatient appropriate because:IV treatments appropriate due to intensity of illness or inability to take PO and Inpatient level of care appropriate due to severity of illness   Dispo: The patient is from: Home              Anticipated d/c is to: Home              Patient currently is not medically stable to d/c.              Difficult to place patient No   Consultants:  GI    Procedures:  Esophagoduodenoscopy   Antimicrobials:  Ceftriaxone 7/6>> 7/8   Subjective: Patient without any specific complaints today. Complained of abdominal fullness and have not been urinating.     Objective: Vitals:   03/01/21 0603 03/01/21 1222 03/01/21 2056 03/02/21 0549  BP: 133/69 122/60 135/69 128/83  Pulse: 77 77 71 80  Resp: 18 17 19 19   Temp: 97.9 F (36.6 C) 97.6 F (36.4 C) 98.2 F (36.8 C) 98.5 F (36.9 C)  TempSrc: Oral Oral Oral Oral  SpO2: 98% 98% 99% 97%  Weight:      Height:        Intake/Output Summary (Last 24 hours) at 03/02/2021 1306 Last data filed at 03/02/2021 0921 Gross per 24 hour  Intake 1142.9 ml  Output --  Net 1142.9 ml   Filed Weights   02/25/21 1258 02/26/21 0748  Weight: 102.5 kg 104.6 kg   Examination:  General exam: Appears calm and comfortable  Respiratory system: Clear to auscultation. Respiratory effort  normal. Cardiovascular system: S1 & S2 heard, RRR. No JVD, murmurs, rubs, gallops or clicks. No pedal edema. Gastrointestinal system: Abdomen is nondistended, soft and nontender. No organomegaly or masses felt. Normal bowel sounds heard.  Central nervous system: Alert and oriented. No focal neurological deficits. Extremities: Symmetric 5 x 5 power. No edema noted Skin: No rashes, lesions or ulcers Psychiatry: Judgement and insight appear normal. Mood & affect appropriate.   Data Reviewed: I have personally reviewed following labs and imaging studies  CBC: Recent Labs  Lab 02/25/21 1315 02/26/21 0436 02/27/21 0503  WBC 23.4* 19.6* 13.2*  NEUTROABS 19.5*  --  9.4*  HGB 16.4 14.6 14.6  HCT 49.4 43.3 43.3  MCV 97.2 93.5 92.9  PLT 357 262 419   Basic Metabolic Panel: Recent Labs  Lab 02/25/21 1315 02/26/21 0436 02/27/21 0503 02/28/21 0358 03/01/21 0638  NA 134* 134* 136  137 140  K 3.7 3.3* 3.2* 2.9* 3.3*  CL 107 110 110 105 108  CO2 16* 15* 19* 25 27  GLUCOSE 192* 159* 103* 84 101*  BUN 37* 45* 49* 34* 18  CREATININE 2.62* 2.24* 1.82* 1.32* 1.08  CALCIUM 9.6 8.5* 7.8* 7.5* 8.1*  MG 1.9  --  1.5* 2.2 2.2   GFR: Estimated Creatinine Clearance: 84.5 mL/min (by C-G formula based on SCr of 1.08 mg/dL). Liver Function Tests: Recent Labs  Lab 02/25/21 1315 02/28/21 0358  AST 13*  --   ALT 20  --   ALKPHOS 86  --   BILITOT 1.0  --   PROT 7.3  --   ALBUMIN 4.0 2.4*   No results for input(s): LIPASE, AMYLASE in the last 168 hours. No results for input(s): AMMONIA in the last 168 hours. Coagulation Profile: Recent Labs  Lab 02/26/21 0436  INR 1.2   Cardiac Enzymes: No results for input(s): CKTOTAL, CKMB, CKMBINDEX, TROPONINI in the last 168 hours. BNP (last 3 results) No results for input(s): PROBNP in the last 8760 hours. HbA1C: No results for input(s): HGBA1C in the last 72 hours. CBG: Recent Labs  Lab 03/01/21 1145 03/01/21 1623 03/01/21 2053  03/02/21 0720 03/02/21 1101  GLUCAP 112* 96 104* 87 94   Lipid Profile: No results for input(s): CHOL, HDL, LDLCALC, TRIG, CHOLHDL, LDLDIRECT in the last 72 hours. Thyroid Function Tests: No results for input(s): TSH, T4TOTAL, FREET4, T3FREE, THYROIDAB in the last 72 hours. Anemia Panel: No results for input(s): VITAMINB12, FOLATE, FERRITIN, TIBC, IRON, RETICCTPCT in the last 72 hours. Urine analysis:    Component Value Date/Time   COLORURINE AMBER (A) 02/25/2021 1424   APPEARANCEUR CLOUDY (A) 02/25/2021 1424   APPEARANCEUR Clear 01/15/2021 1534   LABSPEC 1.019 02/25/2021 1424   PHURINE 5.0 02/25/2021 1424   GLUCOSEU NEGATIVE 02/25/2021 1424   HGBUR SMALL (A) 02/25/2021 1424   BILIRUBINUR SMALL (A) 02/25/2021 1424   BILIRUBINUR Negative 01/15/2021 1534   KETONESUR NEGATIVE 02/25/2021 1424   PROTEINUR 100 (A) 02/25/2021 1424   NITRITE NEGATIVE 02/25/2021 1424   LEUKOCYTESUR LARGE (A) 02/25/2021 1424    Recent Results (from the past 240 hour(s))  Resp Panel by RT-PCR (Flu A&B, Covid) Nasopharyngeal Swab     Status: None   Collection Time: 02/25/21  2:54 PM   Specimen: Nasopharyngeal Swab; Nasopharyngeal(NP) swabs in vial transport medium  Result Value Ref Range Status   SARS Coronavirus 2 by RT PCR NEGATIVE NEGATIVE Final    Comment: (NOTE) SARS-CoV-2 target nucleic acids are NOT DETECTED.  The SARS-CoV-2 RNA is generally detectable in upper respiratory specimens during the acute phase of infection. The lowest concentration of SARS-CoV-2 viral copies this assay can detect is 138 copies/mL. A negative result does not preclude SARS-Cov-2 infection and should not be used as the sole basis for treatment or other patient management decisions. A negative result may occur with  improper specimen collection/handling, submission of specimen other than nasopharyngeal swab, presence of viral mutation(s) within the areas targeted by this assay, and inadequate number of  viral copies(<138 copies/mL). A negative result must be combined with clinical observations, patient history, and epidemiological information. The expected result is Negative.  Fact Sheet for Patients:  EntrepreneurPulse.com.au  Fact Sheet for Healthcare Providers:  IncredibleEmployment.be  This test is no t yet approved or cleared by the [place] FDA and  has been authorized for detection and/or diagnosis of SARS-CoV-2 by FDA under an Emergency Use Authorization (EUA). This  EUA will remain  in effect (meaning this test can be used) for the duration of the COVID-19 declaration under Section 564(b)(1) of the Act, 21 U.S.C.section 360bbb-3(b)(1), unless the authorization is terminated  or revoked sooner.       Influenza A by PCR NEGATIVE NEGATIVE Final   Influenza B by PCR NEGATIVE NEGATIVE Final    Comment: (NOTE) The Xpert Xpress SARS-CoV-2/FLU/RSV plus assay is intended as an aid in the diagnosis of influenza from Nasopharyngeal swab specimens and should not be used as a sole basis for treatment. Nasal washings and aspirates are unacceptable for Xpert Xpress SARS-CoV-2/FLU/RSV testing.  Fact Sheet for Patients: EntrepreneurPulse.com.au  Fact Sheet for Healthcare Providers: IncredibleEmployment.be  This test is not yet approved or cleared by the [place] FDA and has been authorized for detection and/or diagnosis of SARS-CoV-2 by FDA under an Emergency Use Authorization (EUA). This EUA will remain in effect (meaning this test can be used) for the duration of the COVID-19 declaration under Section 564(b)(1) of the Act, 21 U.S.C. section 360bbb-3(b)(1), unless the authorization is terminated or revoked.  Performed at [facility], [address] [place], [place] [address]   Urine culture     Status: None   Collection Time: 02/25/21  3:17 PM   Specimen: Urine, Clean Catch  Result Value Ref  Range Status   Specimen Description   Final    URINE, CLEAN CATCH Performed at [facility], [address] [place], [place] [address]    Special Requests   Final    NONE Performed at [facility], [address], [place], [place] [address]    Culture   Final    NO GROWTH Performed at [facility], [place] [address] [place], [place] [address]    Report Status 02/27/2021 FINAL  Final  C Difficile Quick Screen w PCR reflex     Status: None   Collection Time: 02/25/21  4:31 PM   Specimen: STOOL  Result Value Ref Range Status   C Diff antigen NEGATIVE NEGATIVE Final   C Diff toxin NEGATIVE NEGATIVE Final   C Diff interpretation No C. difficile detected.  Final    Comment: Performed at [facility], [address] [place], [place] [address]  Gastrointestinal Panel by PCR , Stool     Status: None   Collection Time: 02/25/21  4:31 PM   Specimen: STOOL  Result Value Ref Range Status   Campylobacter species NOT DETECTED NOT DETECTED Final   Plesimonas shigelloides NOT DETECTED NOT DETECTED Final   Salmonella species NOT DETECTED NOT DETECTED Final   Yersinia enterocolitica NOT DETECTED NOT DETECTED Final   Vibrio species NOT DETECTED NOT DETECTED Final   Vibrio cholerae NOT DETECTED NOT DETECTED Final   Enteroaggregative E coli (EAEC) NOT DETECTED NOT DETECTED Final   Enteropathogenic E coli (EPEC) NOT DETECTED NOT DETECTED Final   Enterotoxigenic E coli (ETEC) NOT DETECTED NOT DETECTED Final   Shiga like toxin producing E coli (STEC) NOT DETECTED NOT DETECTED Final   Shigella/Enteroinvasive E coli (EIEC) NOT DETECTED NOT DETECTED Final   Cryptosporidium NOT DETECTED NOT DETECTED Final   Cyclospora cayetanensis NOT DETECTED NOT DETECTED Final   Entamoeba histolytica NOT DETECTED NOT DETECTED Final   Giardia lamblia NOT DETECTED NOT DETECTED Final   Adenovirus F40/41 NOT DETECTED NOT DETECTED Final   Astrovirus NOT DETECTED NOT DETECTED Final   Norovirus GI/GII NOT DETECTED NOT  DETECTED Final   Rotavirus A NOT DETECTED NOT DETECTED Final  Sapovirus (I, II, IV, and V) NOT DETECTED NOT DETECTED Final    Comment: Performed at [facility], [address] [place], [place] [address]  Blood culture (routine x 2)     Status: None   Collection Time: 02/25/21  6:00 PM   Specimen: Left Antecubital; Blood  Result Value Ref Range Status   Specimen Description LEFT ANTECUBITAL  Final   Special Requests   Final    BOTTLES DRAWN AEROBIC AND ANAEROBIC Blood Culture adequate volume   Culture   Final    NO GROWTH 5 DAYS Performed at [facility], [address], [place], [place] [address]    Report Status 03/02/2021 FINAL  Final  Blood culture (routine x 2)     Status: None   Collection Time: 02/25/21  6:15 PM   Specimen: Left Antecubital; Blood  Result Value Ref Range Status   Specimen Description LEFT ANTECUBITAL  Final   Special Requests   Final    BOTTLES DRAWN AEROBIC AND ANAEROBIC Blood Culture adequate volume   Culture   Final    NO GROWTH 5 DAYS Performed at [facility], [address], [place], [place] [address]    Report Status 03/02/2021 FINAL  Final   Radiology Studies: No results found.  Scheduled Meds:  feeding supplement (GLUCERNA SHAKE)  237 mL Oral BID BM   finasteride  5 mg Oral Daily   insulin aspart  0-9 Units Subcutaneous TID WC   insulin detemir  10 Units Subcutaneous Daily   latanoprost  1 drop Both Eyes Daily   ondansetron  4 mg Intravenous Once   pantoprazole (PROTONIX) IV  40 mg Intravenous Q12H   potassium chloride  20 mEq Oral QHS   Continuous Infusions:  0.9 % NaCl with KCl 20 mEq / L 35 mL/hr at 03/02/21 1229   sodium chloride       LOS: 5 days   Time spent: 30 minutes  Fredrik Rigger, Medical student [facility] Pager [phone]  If 7PM-7AM, please contact night-coverage [email or web address] Password [email or web address] 03/02/2021, 1:06 PM  ________________________________________________________ ATTENDING NOTE  Patient  seen and examined with Fredrik Rigger, Medical student. In addition to supervising the encounter, I played a key role in the decision making process as well as reviewed key findings.  Pt is going for EGD dilatation of distal stricture later today.  Further recommendations to follow pending results of procedure.  Continue current medical management.    Murvin Natal, MD How to contact the [facility] Attending or Consulting provider [place] or covering provider during after hours [place], for this patient?  Check the care team in [facility] and look for a) attending/consulting [facility] provider listed and b) the [facility] team listed Log into [email or web address] and use [place]'s universal password to access. If you do not have the password, please contact the hospital operator. Locate the [facility] provider you are looking for under [facility] and page to a number that you can be directly reached. If you still have difficulty reaching the provider, please page the [facility] (Director on Call) for the Hospitalists listed on amion for assistance.

## 2021-03-02 NOTE — Progress Notes (Signed)
Bedside report given to Saralyn Pilar, RN. Pt states that he is having difficulty passing his urine again, with urgency. Pt advised that bladder scan would be performed to evaluate for urinary retention. Pt and wife stated understanding. Pt stated would like to take shower first. Linens and supplies provided.

## 2021-03-02 NOTE — Anesthesia Preprocedure Evaluation (Signed)
Anesthesia Evaluation  Patient identified by MRN, date of birth, ID band Patient awake    Reviewed: Allergy & Precautions, NPO status , Patient's Chart, lab work & pertinent test results  Airway Mallampati: I  TM Distance: >3 FB Neck ROM: Full  Mouth opening: Limited Mouth Opening  Dental  (+) Dental Advisory Given, Teeth Intact   Pulmonary neg pulmonary ROS,    Pulmonary exam normal breath sounds clear to auscultation       Cardiovascular Exercise Tolerance: Good + CAD  Normal cardiovascular exam Rhythm:Regular Rate:Normal  1. Left ventricular ejection fraction, by estimation, is 55 to 60%. The  left ventricle has normal function. The left ventricle has no regional  wall motion abnormalities. There is mild left ventricular hypertrophy.  Left ventricular diastolic parameters  were normal.  2. Right ventricular systolic function is normal. The right ventricular  size is normal. There is normal pulmonary artery systolic pressure.  3. The mitral valve is normal in structure. Trivial mitral valve  regurgitation.  4. The aortic valve is tricuspid. Aortic valve regurgitation is not  visualized. Mild aortic valve sclerosis is present, with no evidence of  aortic valve stenosis.    Neuro/Psych negative neurological ROS  negative psych ROS   GI/Hepatic GERD (nausea, vomitings)  Poorly Controlled,(+) Hepatitis -  Endo/Other  diabetes, Well Controlled, Type 2, Oral Hypoglycemic Agents  Renal/GU Renal InsufficiencyRenal disease (AKI, right atrophic kidney)     Musculoskeletal  (+) Arthritis ,   Abdominal   Peds  Hematology negative hematology ROS (+)   Anesthesia Other Findings 1. Left ventricular ejection fraction, by estimation, is 55 to 60%. The  left ventricle has normal function. The left ventricle has no regional  wall motion abnormalities. There is mild left ventricular hypertrophy.  Left ventricular diastolic  parameters  were normal.  2. Right ventricular systolic function is normal. The right ventricular  size is normal. There is normal pulmonary artery systolic pressure.  3. The mitral valve is normal in structure. Trivial mitral valve  regurgitation.  4. The aortic valve is tricuspid. Aortic valve regurgitation is not  visualized. Mild aortic valve sclerosis is present, with no evidence of  aortic valve stenosis.   Reproductive/Obstetrics                             Anesthesia Physical  Anesthesia Plan  ASA: 3 and emergent  Anesthesia Plan: General ETT   Post-op Pain Management:    Induction: Intravenous and Rapid sequence  PONV Risk Score and Plan: Ondansetron  Airway Management Planned: Oral ETT  Additional Equipment:   Intra-op Plan:   Post-operative Plan: Extubation in OR and Possible Post-op intubation/ventilation  Informed Consent: I have reviewed the patients History and Physical, chart, labs and discussed the procedure including the risks, benefits and alternatives for the proposed anesthesia with the patient or authorized representative who has indicated his/her understanding and acceptance.       Plan Discussed with: CRNA and Surgeon  Anesthesia Plan Comments:         Anesthesia Quick Evaluation

## 2021-03-02 NOTE — Transfer of Care (Signed)
Immediate Anesthesia Transfer of Care Note  Patient: Bryan Vargas  Procedure(s) Performed: ESOPHAGOGASTRODUODENOSCOPY (EGD) WITH PROPOFOL BALLOON DILATION BIOPSY  Patient Location: PACU  Anesthesia Type:General  Level of Consciousness: awake, alert  and oriented  Airway & Oxygen Therapy: Patient Spontanous Breathing and Patient connected to nasal cannula oxygen  Post-op Assessment: Report given to RN and Post -op Vital signs reviewed and stable  Post vital signs: Reviewed and stable  Last Vitals:  Vitals Value Taken Time  BP 137/75 03/02/21  1524  Temp    Pulse 70 03/02/21  1524  Resp 19 03/02/21  1524  SpO2 99  03/02/21  1524    Last Pain:  Vitals:   03/02/21 1347  TempSrc:   PainSc: 0-No pain      Patients Stated Pain Goal: 8 (22/41/14 6431)  Complications: No notable events documented.

## 2021-03-02 NOTE — Op Note (Signed)
[facility] Patient Name: Bryan Vargas Procedure Date: 03/02/2021 1:10 PM MRN: [id number] Date of Birth: Dec 18, 1950 Attending MD: Norvel Richards , MD CSN: [id number] Age: 70 Admit Type: Inpatient Procedure:                Upper GI endoscopy Indications:              Abnormal CT of the GI tract, Nausea with vomiting,                            GOO Providers:                Norvel Richards, MD, Rosina Lowenstein, RN, Randa Spike, Technician Referring MD:              Medicines:                General Anesthesia Complications:            No immediate complications. Estimated Blood Loss:     Estimated blood loss was minimal. Procedure:                Pre-Anesthesia Assessment:                           - Prior to the procedure, a History and Physical                            was performed, and patient medications and                            allergies were reviewed. The patient's tolerance of                            previous anesthesia was also reviewed. The risks                            and benefits of the procedure and the sedation                            options and risks were discussed with the patient.                            All questions were answered, and informed consent                            was obtained. Prior Anticoagulants: The patient                            last took heparin on the day of the procedure. ASA                            Grade Assessment: III - A patient with severe  systemic disease. After reviewing the risks and                            benefits, the patient was deemed in satisfactory                            condition to undergo the procedure.                           After obtaining informed consent, the endoscope was                            passed under direct vision. Throughout the                            procedure, the patient's blood pressure, pulse,  and                            oxygen saturations were monitored continuously. The                            GIF-H190 ([id number]) scope was introduced through the                            mouth, and advanced to the third part of duodenum.                            The upper GI endoscopy was accomplished without                            difficulty. The patient tolerated the procedure                            well. Scope In: 2:01:00 PM Scope Out: 3:11:26 PM Total Procedure Duration: 1 hour 10 minutes 26 seconds  Findings:      3 columns of grade 1 esophageal varices - distal esophagusl: Otherwise       tubular esophagus appeared normal. Markedly dilated gastric cavity       without large amount of fluid present. This was easily suctioned out.       Gastric mucosa examined closely. "her diffuse "fish scale or snakeskin"       appearance of the gastric mucosa more pronounced posteriorly involving       the body. Excavated mucosal previously noted again seen. I did not see       any obvious infiltrating process. Pyloric c channel patent easily       traversed. Examination posterior bulb revealed marked inflammatory       changes edema and a markedly stenotic lumen which would not admit the       adult gastroscope. (2) intact medication tablets were sitting in the       bulb in front of the stricture. They were removed with a Jabier Mutton net.      I obtained a sphincterotomewith a guidewire and introduced through the       scope and palpated for the lumen through the stricture with the  guidewire. This was done with fluoroscopic control and the assistance of       Dr. Thornton Papas who was present for major portion of the procedure. Guidewire       was fairly easily advanced down into the second portion of the duodenum;       sphincterotome followed. Gastrografin was injected which we confirmed we       were in the lumen. Images revealed an approximate 2 cm area of       stricturing at the  junction between D1 and D2. The sphincterotome was       withdrawn keeping the guidewire in place under fluoroscopic control. A 6       mm x 2 cm HurriCaine balloon was railed across the stricture and       inflated to 6 mm x 1 minute it was taken down; this resulted in mild       dilation. It was reinflated to 7 mm x 1 minute and was taken down. This       did open up the lumen but would not yet admit the gastroscope. The       HurriCaine balloon was removed and an 8 mm graduated CRE balloon was       railed across the stricture over the guidewire under fluoroscopic       control. It was inflated to 8 mm x 1 minute and taken down.       Subsequently, it was inflated to 9 mm x 1 minute and taken down. This       resulted in slightly more of a dilation without apparent complication or       bleeding. There was an elastic component to this area of stenosis.       Finally, I inflated the CRE balloon to 10 mm x 1 minute and then took it       down. At that point, the diagnostic yesterday still would not passed       across the stenosis but it was open to a significantly greater degree       than when the procedure was initiated. I withdrew the adult gastroscope       and obtained the ultraslim scope with this I was able to advance down       into the stomach and across the stenosis into the second portion of the       duodenum. Marked inflammatory changes of the stenotic/strictured area. I       subsequently removed the ultraslim scope and again obtained the       diagnostic gastroscope and went back and took biopsies of the mucosa       through the stenotic area. This was done without difficulty or apparent       complication. Impression:               - Subtle grade 1 esophageal varices. 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 Excavated/ulcerated gastric mucosa                            previously noted again  seen.                           -Duodenal stricture of uncertain etiology - status                            post dilation and biopsy                           -Intact medication tablets abutting the stricture                            on the gastric side - . Removed                           -Per GI tract currently nonfunctional. Moderate Sedation:      Moderate (conscious) sedation was personally administered by an       anesthesia professional. The following parameters were monitored: oxygen       saturation, heart rate, blood pressure, and response to care. Recommendation:           - Return patient to hospital ward for ongoing care.                           - Clear liquid diet. Continue PPI (IV). I have                            discontinued all of his oral medications. No Reglan                            (either IV or p.o.)                           - Follow-up fasting gastrin, biopsies. Depending on                            his clinical response and benign biopsies, would                            recommend early interval repeat dilation (i.e. in 1                            to 2 weeks). Further recommendations to follow.                           - At patient request, I called Neoma Laming at                            [phone] and reviewed my findings and                            recommendations today. Procedure Code(s):        --- Professional ---                           [phone], Esophagogastroduodenoscopy, flexible,  transoral; diagnostic, including collection of                            specimen(s) by brushing or washing, when performed                            (separate procedure) Diagnosis Code(s):        --- Professional ---                           R11.2, Nausea with vomiting, unspecified                           R93.3, Abnormal findings on diagnostic imaging of                            other parts of digestive tract CPT  copyright 2019 [employer]. All rights reserved. The codes documented in this report are preliminary and upon coder review may  be revised to meet current compliance requirements. Cristopher Estimable. Rehaan Viloria, MD Norvel Richards, MD 03/02/2021 3:46:34 PM This report has been signed electronically. Number of Addenda: 0

## 2021-03-02 NOTE — Progress Notes (Signed)
Pt arrived to room from PACU via [facility] s/p EGD. Pt awake, alert and oriented. Ambulatory from [facility] to recliner. Denies c/o. VSS. Tolerating oral liquids without difficulty.

## 2021-03-02 NOTE — Anesthesia Postprocedure Evaluation (Signed)
Anesthesia Post Note  Patient: Bryan Vargas  Procedure(s) Performed: ESOPHAGOGASTRODUODENOSCOPY (EGD) WITH PROPOFOL BALLOON DILATION BIOPSY  Patient location during evaluation: Phase II Anesthesia Type: General Level of consciousness: awake Pain management: pain level controlled Vital Signs Assessment: post-procedure vital signs reviewed and stable Respiratory status: spontaneous breathing and respiratory function stable Cardiovascular status: blood pressure returned to baseline and stable Postop Assessment: no headache and no apparent nausea or vomiting Anesthetic complications: no Comments: Late entry   No notable events documented.   Last Vitals:  Vitals:   03/02/21 1530 03/02/21 1545  BP: 140/90 138/75  Pulse: 67 (!) 59  Resp: 10 16  Temp:    SpO2: 99% 98%    Last Pain:  Vitals:   03/02/21 1545  TempSrc:   PainSc: 0-No pain                 Louann Sjogren

## 2021-03-02 NOTE — Progress Notes (Signed)
Endoscope In @ 1401 Endoscope Out @ 7793

## 2021-03-02 NOTE — Plan of Care (Signed)

## 2021-03-02 NOTE — Progress Notes (Signed)
    Subjective: Tolerating full liquid diet. Notes he was unable to tolerate solid foods as outpatient, reporting it sitting "heavy" in his stomach. Early satiety. Would vomit to relieve pressure. No N/V currently. No abdominal pain. No diarrhea. Denies any significant NSAID use.   Objective: Vital signs in last 24 hours: Temp:  [97.6 F (36.4 C)-98.5 F (36.9 C)] 98.5 F (36.9 C) (07/11 0549) Pulse Rate:  [71-80] 80 (07/11 0549) Resp:  [17-19] 19 (07/11 0549) BP: (122-135)/(60-83) 128/83 (07/11 0549) SpO2:  [97 %-99 %] 97 % (07/11 0549) Last BM Date: 02/28/21 General:   Alert and oriented, pleasant Head:  Normocephalic and atraumatic. Abdomen:  Bowel sounds present, soft, non-tender, non-distended. No HSM or hernias noted. No rebound or guarding. No masses appreciated  Msk:  Symmetrical without gross deformities. Normal posture. Extremities:  Without edema. Neurologic:  Alert and  oriented x4 Psych:  Alert and cooperative. Normal mood and affect.  Intake/Output from previous day: 07/10 0701 - 07/11 0700 In: 1622.9 [P.O.:480; I.V.:1142.9] Out: -  Intake/Output this shift: No intake/output data recorded.  Lab Results  Component Value Date   WBC 13.2 (H) 02/27/2021   HGB 14.6 02/27/2021   HCT 43.3 02/27/2021   MCV 92.9 02/27/2021   PLT 280 02/27/2021    BMET Recent Labs    02/28/21 0358 03/01/21 0638  NA 137 140  K 2.9* 3.3*  CL 105 108  CO2 25 27  GLUCOSE 84 101*  BUN 34* 18  CREATININE 1.32* 1.08  CALCIUM 7.5* 8.1*   LFT Recent Labs    02/28/21 0358  ALBUMIN 2.4*     Assessment: Pleasant 70 year old male presenting to the ED with diarrhea, N/V, early satiety, inability to tolerate solid foods, with non-contrast CT revealing markedly distended stomach on admission. Diarrhea now resolved. EGD performed 7/8 by Dr. Laural Golden with Grade B esophagitis, non-bleeding gastric body ulcer s/p biopsy and extensive superficial ulceration involving lesser curvature  extending to prepyloric region s/p biopsy, retained food in duodenum s/p removal,  bulbar duodenitis with stricture that could not be traversed with ultraslim scope.   Gastric outlet obstruction: due to duodenal stricture unable to be traversed with ultraslim scope. Not dilated on 7/8. Etiology felt likely secondary to PUD. Celiac serologies remain in process. Surgical pathology pending. Gastrin level in process as Zollinger-Ellison syndrome remains in differential. Currently tolerating full liquid diet. NPO this morning in preparation for repeat EGD, +/- dilation today.   Diarrhea: resolved. GI pathogen panel and CDiff negative.    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 Needs follow-up as outpatient non-urgent.    Plan: Continue PPI IV BID Last dose of heparin at 0548 and on hold currently Follow-up on pending gastrin level and celiac serologies Follow-up on pending Surg pathology Remain NPO Discussing further endoscopic evaluation today with Dr. Gala Romney. Remain NPO for tentative EGD with dilation.  Further recommendations to follow   Annitta Needs, PhD, ANP-BC [facility]    LOS: 5 days    03/02/2021, 8:54 AM

## 2021-03-03 ENCOUNTER — Inpatient Hospital Stay (HOSPITAL_COMMUNITY): Payer: Medicare Other

## 2021-03-03 DIAGNOSIS — I8510 Secondary esophageal varices without bleeding: Secondary | ICD-10-CM

## 2021-03-03 DIAGNOSIS — R338 Other retention of urine: Secondary | ICD-10-CM | POA: Diagnosis present

## 2021-03-03 DIAGNOSIS — K311 Adult hypertrophic pyloric stenosis: Secondary | ICD-10-CM | POA: Diagnosis present

## 2021-03-03 DIAGNOSIS — I8500 Esophageal varices without bleeding: Secondary | ICD-10-CM | POA: Diagnosis present

## 2021-03-03 DIAGNOSIS — K279 Peptic ulcer, site unspecified, unspecified as acute or chronic, without hemorrhage or perforation: Secondary | ICD-10-CM | POA: Diagnosis present

## 2021-03-03 DIAGNOSIS — R319 Hematuria, unspecified: Secondary | ICD-10-CM | POA: Diagnosis present

## 2021-03-03 DIAGNOSIS — K315 Obstruction of duodenum: Secondary | ICD-10-CM

## 2021-03-03 DIAGNOSIS — T83512A Infection and inflammatory reaction due to nephrostomy catheter, initial encounter: Secondary | ICD-10-CM

## 2021-03-03 LAB — CBC WITH DIFFERENTIAL/PLATELET
Abs Immature Granulocytes: 0.1 10*3/uL — ABNORMAL HIGH (ref 0.00–0.07)
Basophils Absolute: 0 10*3/uL (ref 0.0–0.1)
Basophils Relative: 0 %
Eosinophils Absolute: 1.7 10*3/uL — ABNORMAL HIGH (ref 0.0–0.5)
Eosinophils Relative: 22 %
HCT: 34.1 % — ABNORMAL LOW (ref 39.0–52.0)
Hemoglobin: 11.2 g/dL — ABNORMAL LOW (ref 13.0–17.0)
Immature Granulocytes: 1 %
Lymphocytes Relative: 18 %
Lymphs Abs: 1.4 10*3/uL (ref 0.7–4.0)
MCH: 31.5 pg (ref 26.0–34.0)
MCHC: 32.8 g/dL (ref 30.0–36.0)
MCV: 96.1 fL (ref 80.0–100.0)
Monocytes Absolute: 0.5 10*3/uL (ref 0.1–1.0)
Monocytes Relative: 7 %
Neutro Abs: 3.9 10*3/uL (ref 1.7–7.7)
Neutrophils Relative %: 52 %
Platelets: 190 10*3/uL (ref 150–400)
RBC: 3.55 MIL/uL — ABNORMAL LOW (ref 4.22–5.81)
RDW: 13.8 % (ref 11.5–15.5)
WBC: 7.7 10*3/uL (ref 4.0–10.5)
nRBC: 0 % (ref 0.0–0.2)

## 2021-03-03 LAB — COMPREHENSIVE METABOLIC PANEL
ALT: 18 U/L (ref 0–44)
AST: 18 U/L (ref 15–41)
Albumin: 2.7 g/dL — ABNORMAL LOW (ref 3.5–5.0)
Alkaline Phosphatase: 49 U/L (ref 38–126)
Anion gap: 6 (ref 5–15)
BUN: 9 mg/dL (ref 8–23)
CO2: 27 mmol/L (ref 22–32)
Calcium: 8.3 mg/dL — ABNORMAL LOW (ref 8.9–10.3)
Chloride: 109 mmol/L (ref 98–111)
Creatinine, Ser: 0.97 mg/dL (ref 0.61–1.24)
GFR, Estimated: >60 mL/min (ref >60)
Glucose, Bld: 92 mg/dL (ref 70–99)
Potassium: 3.8 mmol/L (ref 3.5–5.1)
Sodium: 142 mmol/L (ref 135–145)
Total Bilirubin: 0.8 mg/dL (ref 0.3–1.2)
Total Protein: 4.9 g/dL — ABNORMAL LOW (ref 6.5–8.1)

## 2021-03-03 LAB — GLUCOSE, CAPILLARY
Glucose-Capillary: 90 mg/dL (ref 70–99)
Glucose-Capillary: 107 mg/dL — ABNORMAL HIGH (ref 70–99)
Glucose-Capillary: 101 mg/dL — ABNORMAL HIGH (ref 70–99)
Glucose-Capillary: 114 mg/dL — ABNORMAL HIGH (ref 70–99)

## 2021-03-03 LAB — MAGNESIUM: Magnesium: 1.8 mg/dL (ref 1.7–2.4)

## 2021-03-03 LAB — GASTRIN: Gastrin: 13 pg/mL (ref 0–115)

## 2021-03-03 MED ORDER — CHLORHEXIDINE GLUCONATE CLOTH 2 % EX PADS: 6.0000 | MEDICATED_PAD | Freq: Every day | CUTANEOUS | Status: DC

## 2021-03-03 MED ORDER — SODIUM CHLORIDE 0.9 % IR SOLN
3000.0000 mL | Status: DC
  Administered 2021-03-03: 3000 mL

## 2021-03-03 NOTE — Progress Notes (Signed)
PROGRESS NOTE    Bryan Vargas  BWG:[id number] DOB: 02/02/1951 DOA: 02/25/2021 PCP: Quentin Cornwall, MD  Outpatient Specialists: Irine Seal MD (Urology)   Brief Narrative:   70 y.o. male, with past medical history of diabetes mellitus, BPH, presents to ED for evaluation of dehydration due to excessive diarrhea, patient had an ED visit 8 days ago, complaining of constipation, where he was started on MiraLAX. He reports he was taking MiraLAX twice daily until this Monday, he has been having multiple loose bowel movements per day, up to 20, does report over last 24 hours he did have some nausea, vomiting, denies any fever, chills, coffee-ground emesis, bright red blood per rectum, report he had screening colonoscopy at age 82, 49, at [facility], and report he was establishing with [facility] in [place], where he is supposed to have another screening colonoscopy next month, he does report history of BPH, for which he is following with at 134, he had white blood cell count of 23, hemoglobin at 16.4, urinalysis was significant for white blood cell count>50, and red blood cell 21-50, CT abdomen significant for markedly distended stomach, and lesions in the pancreatic tail, [facility] consulted to admit.   For the first three days of admission, patient had had uncontrolled diarrhea. GIP, C diff negative. Urine culture negative. Patient had 3 doses of rocephin with improvement of diarrhea. Gastroenterology was consulted and evaluated patient this hospitalization; recommended EGD, to which patient agreed. EGD was performed by Dr. 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 Surgical pathology of two gastric specimens showed no evidence of malignancy. Patient informed of biopsy results.   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 Patient also has stable BPH and is treated with finasteride and flomax for 6 months.   Patient undergoes  EGD 7/11 for stricture dilation by Dr. Gala Romney. Plans for repeated dilation in 1-2 weeks to prevent future gastric outlet obstruction.   Patient developed urinary retention and received a foley and CBI 7/11 PM with improvement.    Assessment & Plan:   Principal Problem:   UTI (urinary tract infection) Active Problems:   AKI (acute kidney injury) ([facility])   Diabetes mellitus (HCC)   Dehydration   Gastric dilatation   GERD (gastroesophageal reflux disease)   Diarrhea   Hypokalemia   Leukocytosis   Metabolic acidosis   Non-intractable vomiting    Gastric outlet obstruction - Continue full liquid diet, Dr. Gala Romney successfully performed balloon dilation and biopsy of duodenal stricture 7/11. He is being investigated for possible ZE syndrome with gastrin level testing - Surgical pathology of biopsy was benign for both specimen; Wathrin negative for H pylori infection.  - [ ]  f/u pending gastrin level and celiac serologies - [ ]  f/u pending duodenal stricture biopsy - EGD 7/11 showed possible stage 1 esophageal varices, snake-skin like mucosa, 2 undigested pills in front of stricture. - NO PO medications are given at this time - Full liquid diet per GI - Plan to repeat EGD for serial dilation outpatient in 1-2 weeks pending gastrin level and biopsy  ?Small esophageal varices: 3 columns of subtle grade 1 esophageal varices and abnormal gastric mucosa suspicious for portal gastropathy noted on repeat EGD 7/11. No known history of liver disease. CT A/P without contrast this admission with no mention of cirrhosis. Spleen normal. Platelets and LFTs normal.  - [ ]  f/u RUQ [place] and elastography - AVOID all NSAIDs  Urinary retention -  bladder scan showed 326ml yesterday; reports urgency, trouble initiating stream, foley placed with CBI  - Patient has history of BPH, on proscar and flomax - Plan to remove foley with a voiding trial tomorrow 7/13 - Will need to follow up with urology  outpatient  Hematuria - suspect secondary to traumatic foley placement, he received CBI overnight.  Urine is pink tinged now.  DC CBI therapy today.    Nausea and vomiting  - Improved to Resolved.  Secondary to EGD proven gastric outlet obstruction. At this time he remains on clear liquid diet.  Multiple gastric ulcers -  Pt remains on IV pantoprazole BID.  Hypokalemia/ Hypomagnesemia - RESOLVED - due to GI loss  AKI -  RESOLVED now. Pt had a creatinine of 1.00 in Feb 2022. Likely due to dehydration from diarrhea. Trending down and normal today 7/11 (Cr 1.08). Patient states he has not been able to urinate. - Bladder scan ordered to evaluate for retention - Renal US 7/7 showed atrophic R kidney, multiple cysts on L kidney but no signs of obstructive hydronephrosis.   Metabolic acidosis - resolved now after treating with sodium bicarbonate IV fluids.  DC IVF 7/12.   UTI - TREATED.  U/A obtained in ED showed WBC >50. Urine culture negative after 48 hours. Patient was not on antibiotics recently prior to admission. Patient completed 3 doses of rocephin on 7/8.  - Consider interstitial cystitis as well. Follow up with urology outpatient  Diarrhea - multifactorial. C diff was negative.  GI path panel negative. Patient continues to have loose stool.   Pancreatic lesions - Pt reporting that he is receiving close follow by his GI in [place] and reports the lesions have been stable. No inpatient imaging is recommended at this time, per GI   Type 2 diabetes mellitus - A1c 5.8% which is evidence of optimal glycemic control, continue levemir and SSI coverage with frequent CBG monitoring.     BPH - stable on proscar therapy. Following with outpatient urology     DVT prophylaxis: SQ heparin - last dose at 0548 7/11 and hold currently  Code Status: full  Family Communication: plan of care discussed with patient at bedside, who verbalized understanding Disposition: home Status is: Inpatient    Remains inpatient appropriate because:IV treatments appropriate due to intensity of illness or inability to take PO and Inpatient level of care appropriate due to severity of illness   Dispo: The patient is from: Home              Anticipated d/c is to: Home              Patient currently is not medically stable to d/c.              Difficult to place patient No   Consultants:  GI    Procedures:  Esophagoduodenoscopy   Antimicrobials:  Ceftriaxone 7/6>> 7/8  Subjective: Patient was up in bed having breakfast. Patient reports having bladder scan yesterday which showed 39ml. States that he can only have dribbling of urine without able to urinate well last night and a foley was placed. Due to large amount of blood seen, continuous bladder irrigation was initiated. Patient reports better improvement. Informed of benign biopsy results and plan for outpatient EGD  Objective: Vitals:   03/02/21 1545 03/02/21 1630 03/02/21 2122 03/03/21 0407  BP: 138/75 (!) 143/78 127/79 124/79  Pulse: (!) 59 63 75 67  Resp: 16 16 18 18   Temp:   98.4  F (36.9 C) 98.6 F (37 C)  TempSrc:      SpO2: 98% 100% 99% 96%  Weight:      Height:        Intake/Output Summary (Last 24 hours) at 03/03/2021 1319 Last data filed at 03/03/2021 1000 Gross per 24 hour  Intake 8644.45 ml  Output 8400 ml  Net 244.45 ml   Filed Weights   02/25/21 1258 02/26/21 0748  Weight: 102.5 kg 104.6 kg    Examination:  General exam: Appears calm and comfortable  Respiratory system: Clear to auscultation. Respiratory effort normal. Cardiovascular system: S1 & S2 heard, RRR. No JVD, murmurs, rubs, gallops or clicks. No pedal edema. Gastrointestinal system: Abdomen is nondistended, soft and nontender. No organomegaly or masses felt. Normal bowel sounds heard. GU: foley in place with pink tinged urine seen.  Central nervous system: Alert and oriented. No focal neurological deficits. Extremities: Symmetric 5 x 5  power. Skin: No rashes, lesions or ulcers Psychiatry: Judgement and insight appear normal. Mood & affect appropriate.   Data Reviewed: I have personally reviewed following labs and imaging studies  CBC: Recent Labs  Lab 02/25/21 1315 02/26/21 0436 02/27/21 0503 03/03/21 0600  WBC 23.4* 19.6* 13.2* 7.7  NEUTROABS 19.5*  --  9.4* 3.9  HGB 16.4 14.6 14.6 11.2*  HCT 49.4 43.3 43.3 34.1*  MCV 97.2 93.5 92.9 96.1  PLT 357 262 280 353   Basic Metabolic Panel: Recent Labs  Lab 02/25/21 1315 02/26/21 0436 02/27/21 0503 02/28/21 0358 03/01/21 0638 03/03/21 0600  NA 134* 134* 136 137 140 142  K 3.7 3.3* 3.2* 2.9* 3.3* 3.8  CL 107 110 110 105 108 109  CO2 16* 15* 19* 25 27 27   GLUCOSE 192* 159* 103* 84 101* 92  BUN 37* 45* 49* 34* 18 9  CREATININE 2.62* 2.24* 1.82* 1.32* 1.08 0.97  CALCIUM 9.6 8.5* 7.8* 7.5* 8.1* 8.3*  MG 1.9  --  1.5* 2.2 2.2 1.8   GFR: Estimated Creatinine Clearance: 94 mL/min (by C-G formula based on SCr of 0.97 mg/dL). Liver Function Tests: Recent Labs  Lab 02/25/21 1315 02/28/21 0358 03/03/21 0600  AST 13*  --  18  ALT 20  --  18  ALKPHOS 86  --  49  BILITOT 1.0  --  0.8  PROT 7.3  --  4.9*  ALBUMIN 4.0 2.4* 2.7*   No results for input(s): LIPASE, AMYLASE in the last 168 hours. No results for input(s): AMMONIA in the last 168 hours. Coagulation Profile: Recent Labs  Lab 02/26/21 0436  INR 1.2   Cardiac Enzymes: No results for input(s): CKTOTAL, CKMB, CKMBINDEX, TROPONINI in the last 168 hours. BNP (last 3 results) No results for input(s): PROBNP in the last 8760 hours. HbA1C: No results for input(s): HGBA1C in the last 72 hours. CBG: Recent Labs  Lab 03/02/21 1540 03/02/21 1617 03/02/21 2124 03/03/21 0728 03/03/21 1137  GLUCAP 76 70 116* 90 107*   Lipid Profile: No results for input(s): CHOL, HDL, LDLCALC, TRIG, CHOLHDL, LDLDIRECT in the last 72 hours. Thyroid Function Tests: No results for input(s): TSH, T4TOTAL, FREET4,  T3FREE, THYROIDAB in the last 72 hours. Anemia Panel: No results for input(s): VITAMINB12, FOLATE, FERRITIN, TIBC, IRON, RETICCTPCT in the last 72 hours. Urine analysis:    Component Value Date/Time   COLORURINE AMBER (A) 02/25/2021 1424   APPEARANCEUR CLOUDY (A) 02/25/2021 1424   APPEARANCEUR Clear 01/15/2021 1534   LABSPEC 1.019 02/25/2021 1424   PHURINE 5.0 02/25/2021 1424  GLUCOSEU NEGATIVE 02/25/2021 1424   HGBUR SMALL (A) 02/25/2021 1424   BILIRUBINUR SMALL (A) 02/25/2021 1424   BILIRUBINUR Negative 01/15/2021 1534   KETONESUR NEGATIVE 02/25/2021 1424   PROTEINUR 100 (A) 02/25/2021 1424   NITRITE NEGATIVE 02/25/2021 1424   LEUKOCYTESUR LARGE (A) 02/25/2021 1424    Recent Results (from the past 240 hour(s))  Resp Panel by RT-PCR (Flu A&B, Covid) Nasopharyngeal Swab     Status: None   Collection Time: 02/25/21  2:54 PM   Specimen: Nasopharyngeal Swab; Nasopharyngeal(NP) swabs in vial transport medium  Result Value Ref Range Status   SARS Coronavirus 2 by RT PCR NEGATIVE NEGATIVE Final    Comment: (NOTE) SARS-CoV-2 target nucleic acids are NOT DETECTED.  The SARS-CoV-2 RNA is generally detectable in upper respiratory specimens during the acute phase of infection. The lowest concentration of SARS-CoV-2 viral copies this assay can detect is 138 copies/mL. A negative result does not preclude SARS-Cov-2 infection and should not be used as the sole basis for treatment or other patient management decisions. A negative result may occur with  improper specimen collection/handling, submission of specimen other than nasopharyngeal swab, presence of viral mutation(s) within the areas targeted by this assay, and inadequate number of viral copies(<138 copies/mL). A negative result must be combined with clinical observations, patient history, and epidemiological information. The expected result is Negative.  Fact Sheet for Patients:  EntrepreneurPulse.com.au  Fact  Sheet for Healthcare Providers:  IncredibleEmployment.be  This test is no t yet approved or cleared by the [place] FDA and  has been authorized for detection and/or diagnosis of SARS-CoV-2 by FDA under an Emergency Use Authorization (EUA). This EUA will remain  in effect (meaning this test can be used) for the duration of the COVID-19 declaration under Section 564(b)(1) of the Act, 21 U.S.C.section 360bbb-3(b)(1), unless the authorization is terminated  or revoked sooner.       Influenza A by PCR NEGATIVE NEGATIVE Final   Influenza B by PCR NEGATIVE NEGATIVE Final    Comment: (NOTE) The Xpert Xpress SARS-CoV-2/FLU/RSV plus assay is intended as an aid in the diagnosis of influenza from Nasopharyngeal swab specimens and should not be used as a sole basis for treatment. Nasal washings and aspirates are unacceptable for Xpert Xpress SARS-CoV-2/FLU/RSV testing.  Fact Sheet for Patients: EntrepreneurPulse.com.au  Fact Sheet for Healthcare Providers: IncredibleEmployment.be  This test is not yet approved or cleared by the [place] FDA and has been authorized for detection and/or diagnosis of SARS-CoV-2 by FDA under an Emergency Use Authorization (EUA). This EUA will remain in effect (meaning this test can be used) for the duration of the COVID-19 declaration under Section 564(b)(1) of the Act, 21 U.S.C. section 360bbb-3(b)(1), unless the authorization is terminated or revoked.  Performed at [facility], [address], [place], [place] [address]   Urine culture     Status: None   Collection Time: 02/25/21  3:17 PM   Specimen: Urine, Clean Catch  Result Value Ref Range Status   Specimen Description   Final    URINE, CLEAN CATCH Performed at [facility], [address], [place], [place] [address]    Special Requests   Final    NONE Performed at [facility], [address], [place], [place] [address]    Culture    Final    NO GROWTH Performed at [facility], [place] [address], [place],  [address]    Report Status 02/27/2021 FINAL  Final  C Difficile Quick Screen  w PCR reflex     Status: None   Collection Time: 02/25/21  4:31 PM   Specimen: STOOL  Result Value Ref Range Status   C Diff antigen NEGATIVE NEGATIVE Final   C Diff toxin NEGATIVE NEGATIVE Final   C Diff interpretation No C. difficile detected.  Final    Comment: Performed at [facility], [address], [place], [place] [address]  Gastrointestinal Panel by PCR , Stool     Status: None   Collection Time: 02/25/21  4:31 PM   Specimen: STOOL  Result Value Ref Range Status   Campylobacter species NOT DETECTED NOT DETECTED Final   Plesimonas shigelloides NOT DETECTED NOT DETECTED Final   Salmonella species NOT DETECTED NOT DETECTED Final   Yersinia enterocolitica NOT DETECTED NOT DETECTED Final   Vibrio species NOT DETECTED NOT DETECTED Final   Vibrio cholerae NOT DETECTED NOT DETECTED Final   Enteroaggregative E coli (EAEC) NOT DETECTED NOT DETECTED Final   Enteropathogenic E coli (EPEC) NOT DETECTED NOT DETECTED Final   Enterotoxigenic E coli (ETEC) NOT DETECTED NOT DETECTED Final   Shiga like toxin producing E coli (STEC) NOT DETECTED NOT DETECTED Final   Shigella/Enteroinvasive E coli (EIEC) NOT DETECTED NOT DETECTED Final   Cryptosporidium NOT DETECTED NOT DETECTED Final   Cyclospora cayetanensis NOT DETECTED NOT DETECTED Final   Entamoeba histolytica NOT DETECTED NOT DETECTED Final   Giardia lamblia NOT DETECTED NOT DETECTED Final   Adenovirus F40/41 NOT DETECTED NOT DETECTED Final   Astrovirus NOT DETECTED NOT DETECTED Final   Norovirus GI/GII NOT DETECTED NOT DETECTED Final   Rotavirus A NOT DETECTED NOT DETECTED Final   Sapovirus (I, II, IV, and V) NOT DETECTED NOT DETECTED Final    Comment: Performed at [facility], [address] [place], [place] [address]  Blood culture (routine x 2)     Status:  None   Collection Time: 02/25/21  6:00 PM   Specimen: Left Antecubital; Blood  Result Value Ref Range Status   Specimen Description LEFT ANTECUBITAL  Final   Special Requests   Final    BOTTLES DRAWN AEROBIC AND ANAEROBIC Blood Culture adequate volume   Culture   Final    NO GROWTH 5 DAYS Performed at [facility], [address], [place], [place] [address]    Report Status 03/02/2021 FINAL  Final  Blood culture (routine x 2)     Status: None   Collection Time: 02/25/21  6:15 PM   Specimen: Left Antecubital; Blood  Result Value Ref Range Status   Specimen Description LEFT ANTECUBITAL  Final   Special Requests   Final    BOTTLES DRAWN AEROBIC AND ANAEROBIC Blood Culture adequate volume   Culture   Final    NO GROWTH 5 DAYS Performed at [facility], [address], [place],  [address]    Report Status 03/02/2021 FINAL  Final     Radiology Studies: DG C-Arm 1-60 Min  Result Date: 03/02/2021 CLINICAL DATA:  Duodenal stricture, dilatation EXAM: DG C-ARM 1-60 MIN CONTRAST:  Please refer to operative records for type and volume of contrast utilized FLUOROSCOPY TIME:  Fluoroscopy Time:  7 minutes 16 seconds Radiation Exposure Index (if provided by the fluoroscopic device): 222.6 mGy Number of Acquired Spot Images: Insert spots COMPARISON:  CT abdomen and pelvis 02/25/2021 FINDINGS: I was present in the operating room during the imaging. Guidewire was utilized during endoscopy to traverse a duodenal stricture. Contrast injection demonstrated narrowing of the proximal second portion  of the duodenum just distal to the duodenal bulb. The duodenal stricture was sequentially dilated by a 6 mm balloon, then to 8 mm and finally to 10 mm diameter. IMPRESSION: Intraoperative fluoroscopy during dilatation of duodenal stricture. Electronically Signed   By: Lavonia Dana M.D.   On: 03/02/2021 15:39    Scheduled Meds:  Chlorhexidine Gluconate Cloth  6 each Topical Daily   feeding supplement (GLUCERNA  SHAKE)  237 mL Oral BID BM   insulin aspart  0-9 Units Subcutaneous TID WC   insulin detemir  10 Units Subcutaneous Daily   latanoprost  1 drop Both Eyes Daily   ondansetron  4 mg Intravenous Once   pantoprazole (PROTONIX) IV  40 mg Intravenous Q12H   Continuous Infusions:  0.9 % NaCl with KCl 20 mEq / L 35 mL/hr at 03/03/21 0653   sodium chloride irrigation      LOS: 6 days   Time spent: 30 minutes  Fredrik Rigger, Medical student [facility] Pager [phone]  If 7PM-7AM, please contact night-coverage [email or web address] Password [email or web address] 03/03/2021, 1:19 PM  __________________________________________________________________ ATTENDING NOTE  Patient seen and examined with Fredrik Rigger, Medical student. In addition to supervising the encounter, I played a key role in the decision making process as well as reviewed key findings.  He had acute urinary retention with traumatic foley placement with hematuria seen.  He was briefly on CBI but can stop now.  Likely could DC foley 7/13 and do voiding trial.  He will need to stay on liquid diet and liquid medication until he can have more dilatations done per GI.   Murvin Natal, MD  [facility] How to contact the [facility] Attending or Consulting provider [place] or covering provider during after hours [place], for this patient?  Check the care team in [facility] and look for a) attending/consulting [facility] provider listed and b) the [facility] team listed Log into [email or web address] and use [place]'s universal password to access. If you do not have the password, please contact the hospital operator. Locate the [facility] provider you are looking for under [facility] and page to a number that you can be directly reached. If you still have difficulty reaching the provider, please page the [facility] (Director on Call) for the Hospitalists listed on amion for assistance.

## 2021-03-03 NOTE — Progress Notes (Signed)
Pt awake, alert, oriented. Denies any c/o pain or discomfort. Pt states feels much better since catheter placed. Pt with CBI infusing via 3-way foley cath, output light pink in color, no clots noted. IVF continue with no s/s infiltration.

## 2021-03-03 NOTE — Progress Notes (Signed)
Pt's CBI has been slowly decreased in rate since beginning of shift. Currently, pt's output is light yellow, no bleeding or clots noted in tubing. Pt's abd and bladder area soft and non-tender to palaption. CBI stopped per order. Pt and wife aware, pt advised to report any increased abd pain or bladder fullness. States understanding.

## 2021-03-03 NOTE — Progress Notes (Signed)
Patient complaining of inability to void at this time. Had similar episode earlier in shift with relief noted. This occurrence unable to find relief. Order for in and out. Patient noted to have large amount of blood noted. Notified MD. Order for CBI at this time.

## 2021-03-03 NOTE — Progress Notes (Signed)
Subjective: Trouble emptying his bladder today so catheter was placed. Has hematuria. States he chronically has a weak stream and follows with urology outpatient. Doing ok with the catheter.  Tolerating clear liquids fine.  No nausea, vomiting, abdominal pain.  Bowels moving well with no diarrhea.  No BRBPR or melena. Wants to go home as soon as possible.   Denies any known history of liver disease.  Reports he used to drink heavily on the weekends when he was younger, but nothing significant in 20 years.  He has had some mild swelling in his lower extremities since lying in bed this hospitalization.  No significant history of peripheral edema.  No abdominal swelling, yellowing of the eyes or skin, changes in mental status, easy bruising or bleeding.  No known family history of liver disease.  Reports his father may have had a liver biopsy, but he has not sure what the result was or the indication.   Objective: Vital signs in last 24 hours: Temp:  [98.2 F (36.8 C)-98.6 F (37 C)] 98.6 F (37 C) (07/12 0407) Pulse Rate:  [59-75] 67 (07/12 0407) Resp:  [10-21] 18 (07/12 0407) BP: (124-143)/(75-90) 124/79 (07/12 0407) SpO2:  [96 %-100 %] 96 % (07/12 0407) Last BM Date: 03/02/21 General:   Alert and oriented, pleasant Head:  Normocephalic and atraumatic. Eyes:  No icterus, sclera clear. Conjuctiva pink.  Lungs: Clear to auscultation bilaterally, without wheezing, rales, or rhonchi.  Abdomen:  Bowel sounds present, soft, non-tender, non-distended. No HSM or hernias noted. No rebound or guarding. No masses appreciated  Msk:  Symmetrical without gross deformities. Normal posture. Extremities:  With 1+pitting edema in LE.  Neurologic:  Alert and  oriented x4;  grossly normal neurologically. Psych:  Normal mood and affect.  Intake/Output from previous day: 07/11 0701 - 07/12 0700 In: 6404.5 [P.O.:480; I.V.:924.5] Out: 5600 [Urine:5600] Intake/Output this shift: Total I/O In: 2240  [P.O.:240; Other:2000] Out: 2800 [Urine:2800]  Lab Results: Recent Labs    03/03/21 0600  WBC 7.7  HGB 11.2*  HCT 34.1*  PLT 190   BMET Recent Labs    03/01/21 0638 03/03/21 0600  NA 140 142  K 3.3* 3.8  CL 108 109  CO2 27 27  GLUCOSE 101* 92  BUN 18 9  CREATININE 1.08 0.97  CALCIUM 8.1* 8.3*   LFT Recent Labs    03/03/21 0600  PROT 4.9*  ALBUMIN 2.7*  AST 18  ALT 18  ALKPHOS 49  BILITOT 0.8   Studies/Results: DG C-Arm 1-60 Min  Result Date: 03/02/2021 CLINICAL DATA:  Duodenal stricture, dilatation EXAM: DG C-ARM 1-60 MIN CONTRAST:  Please refer to operative records for type and volume of contrast utilized FLUOROSCOPY TIME:  Fluoroscopy Time:  7 minutes 16 seconds Radiation Exposure Index (if provided by the fluoroscopic device): 222.6 mGy Number of Acquired Spot Images: Insert spots COMPARISON:  CT abdomen and pelvis 02/25/2021 FINDINGS: I was present in the operating room during the imaging. Guidewire was utilized during endoscopy to traverse a duodenal stricture. Contrast injection demonstrated narrowing of the proximal second portion of the duodenum just distal to the duodenal bulb. The duodenal stricture was sequentially dilated by a 6 mm balloon, then to 8 mm and finally to 10 mm diameter. IMPRESSION: Intraoperative fluoroscopy during dilatation of duodenal stricture. Electronically Signed   By: Lavonia Dana M.D.   On: 03/02/2021 15:39    Assessment:  70 year old male presenting to the ED with diarrhea, N/V, early satiety, inability to tolerate  solid foods, with non-contrast CT revealing markedly distended stomach on admission. EGD performed 7/8 by Dr. Laural Golden with Grade B esophagitis, non-bleeding gastric body ulcer s/p biopsy and extensive superficial ulceration involving lesser curvature extending to prepyloric region s/p biopsy (benign), retained food in duodenum s/p removal,  bulbar duodenitis with stricture that could not be traversed with ultraslim  scope.  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 Not dilated on 7/8. Repeat EGD with balloon dilation of duodenal stricture under fluoroscopy to 10 mm which admitted the ultraslim gastroscope s/p biopsies of the stenotic area. Notably, patient had intact medication tablets abutting the stricture on the gastric side which were removed.  All of his oral medications were discontinued and recommended against Reglan.   Etiology felt likely secondary to PUD. Celiac serologies remain in process. Gastrin level in process as Zollinger-Ellison syndrome remains in differential. He denies NSAIDs and gastric biopsy negative for H. Pylori. Depending on clinical course and benign biopsies, will need to consider early interval repeat dilation in 1-2 weeks. Currently he is tolerating clear liquids well.  We will advance to full liquids. If he tolerates this well, would consider discharging home on full liquids until repeat EGD is performed.   Diarrhea: Resolved. GI pathogen panel and CDiff negative.  ?  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  No known history of liver disease.  CT A/P without contrast  this admission with no mention of cirrhosis.  Spleen normal.  Platelets and LFTs normal. Will check RUQ [place] and elastography.   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 Needs follow-up as outpatient non-urgent.   Plan: 1.  RUQ ultrasound with elastography. 2.  Advance to full liquids today. 3.  Continue PPI twice daily. 4.  Follow-up on surgical pathology. 5.  Follow-up on celiac serologies and gastrin level. 6.  Pending clinical course and pathology results, will need to consider early interval repeat dilation of duodenal stricture in 1 to 2 weeks.  If he tolerates  full liquids well, can consider discharging on this. 7.  Will likely need liquid medications or ability to crush medications at discharge. 8.  Avoid all NSAIDs.  9.  Outpatient follow-up of pancreatic lesions.   LOS: 6 days    03/03/2021, 1:32 PM   Aliene Altes, [facility]

## 2021-03-04 ENCOUNTER — Inpatient Hospital Stay (HOSPITAL_COMMUNITY): Payer: Medicare Other

## 2021-03-04 ENCOUNTER — Encounter (HOSPITAL_COMMUNITY): Payer: Self-pay | Admitting: Internal Medicine

## 2021-03-04 LAB — GLUCOSE, CAPILLARY
Glucose-Capillary: 94 mg/dL (ref 70–99)
Glucose-Capillary: 102 mg/dL — ABNORMAL HIGH (ref 70–99)

## 2021-03-04 LAB — BASIC METABOLIC PANEL
Anion gap: 7 (ref 5–15)
BUN: 7 mg/dL — ABNORMAL LOW (ref 8–23)
CO2: 28 mmol/L (ref 22–32)
Calcium: 8.6 mg/dL — ABNORMAL LOW (ref 8.9–10.3)
Chloride: 105 mmol/L (ref 98–111)
Creatinine, Ser: 1.01 mg/dL (ref 0.61–1.24)
GFR, Estimated: >60 mL/min (ref >60)
Glucose, Bld: 94 mg/dL (ref 70–99)
Potassium: 3.9 mmol/L (ref 3.5–5.1)
Sodium: 140 mmol/L (ref 135–145)

## 2021-03-04 LAB — CBC
HCT: 35.1 % — ABNORMAL LOW (ref 39.0–52.0)
Hemoglobin: 11.6 g/dL — ABNORMAL LOW (ref 13.0–17.0)
MCH: 31.7 pg (ref 26.0–34.0)
MCHC: 33 g/dL (ref 30.0–36.0)
MCV: 95.9 fL (ref 80.0–100.0)
Platelets: 199 10*3/uL (ref 150–400)
RBC: 3.66 MIL/uL — ABNORMAL LOW (ref 4.22–5.81)
RDW: 13.3 % (ref 11.5–15.5)
WBC: 9 10*3/uL (ref 4.0–10.5)
nRBC: 0 % (ref 0.0–0.2)

## 2021-03-04 LAB — MAGNESIUM: Magnesium: 1.8 mg/dL (ref 1.7–2.4)

## 2021-03-04 LAB — SURGICAL PATHOLOGY

## 2021-03-04 LAB — TISSUE TRANSGLUTAMINASE, IGA: Tissue Transglutaminase Ab, IgA: <2 U/mL (ref 0–3)

## 2021-03-04 MED ORDER — PANTOPRAZOLE SODIUM 40 MG PO TBEC: 40.0000 mg | DELAYED_RELEASE_TABLET | Freq: Two times a day (BID) | ORAL | 2 refills | Status: DC

## 2021-03-04 MED ORDER — GLUCERNA SHAKE PO LIQD: 237.0000 mL | Freq: Two times a day (BID) | ORAL | 0 refills | Status: DC

## 2021-03-04 NOTE — Care Management Important Message (Signed)
Important Message  Patient Details  Name: Bryan Vargas MRN: [id number] Date of Birth: [birth year]   Medicare Important Message Given:  Yes     Tommy Medal 03/04/2021, 12:11 PM

## 2021-03-04 NOTE — Plan of Care (Signed)

## 2021-03-04 NOTE — Discharge Summary (Signed)
Physician Discharge Summary  Bryan Vargas YSA:[id number] DOB: September 12, 1950 DOA: 02/25/2021  PCP: Bryan Cornwall, MD  Admit date: 02/25/2021  Discharge date: 03/04/2021  Admitted From: home  Disposition:  home  Recommendations for Outpatient Follow-up:  Follow up with PCP in 1-2 weeks Follow up with outpatient GI for serial EGD dilation in 1-2 weeks Medications should be crushed or in liquid forms. No pills Soft diets Follow up with Bryan Vargas (urology) in <1 week if urinary retention persists and for hematuria Please take pantoprazole (protonix) 40 mg two times daily. Please hold NSAIDs given repeat EGDs, gastric ulcers Please obtain CMP and CBC in one week Please provide patient education with results from GI mucosal biopsies and RUQ [place] result Please follow up on the following pending results: - duodenal stricture biopsies - gastric biopsies were negative for metaplasia/dysplasia/malignancy, negative for H.pylori.   Home Health: none, patient refused.  Equipment/Devices: none  Discharge Condition:Stable  CODE STATUS: Full  Diet recommendation: Heart Healthy, Low Fat  Brief/Interim Summary:  Bryan Vargas is a 70 year old man with past medical history of diabetes mellitus, BPH, GERD, pancreatic cysts presents to ED for evaluation of dehydration due to excessive diarrhea and electrolyte abnormalities, leukocytosis, and UA remarkable for leukouria, hematuria. CT abdomen significant for markedly distended stomach, stable pancreatic cysts. He was admitted to hospitalization for workup and treatment of dehydration/ AKI, diarrhea and bowel obstruction.   Of note, patient had screening colonoscopy at age 53, 35, at [facility], and report he was establishing with [facility] in [place], where he is supposed to have another screening colonoscopy next month.    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 Patient also has stable  BPH and is treated with finasteride and flomax for 6 months.   During hospitalization, patient had uncontrolled diarrhea early on. GIP, C diff negative. Urine culture negative. Patient had 3 doses of rocephin with improvement of diarrhea. Gastroenterology was consulted and evaluated patient this hospitalization; recommended EGD, to which patient agreed. EGD was performed by Bryan Vargas 7/8, which showed 4-37mm duodenal stricture and marked ulcerations. Surgical pathology of two gastric mucosal specimens showed no evidence of malignancy. Patient informed of biopsy results.  Patient undergoes repeat EGD 7/11 for stricture dilation by Bryan Vargas, who found grade 1 esophageal varices, snake-skin like mucosa and undigested pills. Patient was advanced to soft diet today 7/13. RUQ [place] with elastography showed biliary sludge and gallstones but no acute findings. Consider MRI/MRCP if patient complains of persistent RUQ pain. Plans for serial dilation in 1-2 weeks to prevent future gastric outlet obstruction.   Patient developed urinary retention and received a foley and CBI 7/11 PM. Foley removed and patient had successful voiding trial with 200 ml of cranberry colored urine. Plans to follow up with GI and urology outpatient.   Upon discharge, patient is hemodynamically stable, advanced to soft diet which he is tolerating   Discharge Diagnoses:  Principal Problem:   UTI (urinary tract infection) Active Problems:   AKI (acute kidney injury) ([place])   Diabetes mellitus ([place])   Dehydration   Gastric dilatation   GERD (gastroesophageal reflux disease)   Diarrhea   Hypokalemia   Leukocytosis   Metabolic acidosis   Non-intractable vomiting   Esophageal varices ([facility])   PUD (peptic ulcer disease)   Duodenal stricture   Gastric outlet obstruction   Acute urinary retention   Hematuria  Principal diagnosis: Gastric outlet obstruction UTI AKI  Acute urinary retention    Discharge Instructions  Discharge  Instructions     Ambulatory referral to Urology   Complete by: As directed    Diet - low sodium heart healthy   Complete by: As directed    Increase activity slowly   Complete by: As directed       Allergies as of 03/04/2021   No Known Allergies      Medication List     STOP taking these medications    omeprazole 40 MG capsule Commonly known as: PRILOSEC       TAKE these medications    aspirin EC 81 MG tablet Take 81 mg by mouth daily. Swallow whole.   azelastine 0.1 % nasal spray Commonly known as: ASTELIN Place into both nostrils.   CENTRUM ADULTS PO Take 1 tablet by mouth daily.   feeding supplement (GLUCERNA SHAKE) Liqd Take 237 mLs by mouth 2 (two) times daily between meals.   finasteride 5 MG tablet Commonly known as: PROSCAR Take 1 tablet (5 mg total) by mouth daily.   Januvia 100 MG tablet Generic drug: sitaGLIPtin Take 100 mg by mouth daily.   latanoprost 0.005 % ophthalmic solution Commonly known as: XALATAN Place 1 drop into both eyes daily.   Levemir FlexTouch 100 UNIT/ML FlexPen Generic drug: insulin detemir SMARTSIG:20 Unit(s) SUB-Q Daily   metoCLOPramide 10 MG tablet Commonly known as: REGLAN Take 1 tablet (10 mg total) by mouth every 8 (eight) hours as needed for nausea.   pantoprazole 40 MG tablet Commonly known as: Protonix Take 1 tablet (40 mg total) by mouth 2 (two) times daily.        Follow-up Information     Bryan Cornwall, MD. Schedule an appointment as soon as possible for a visit in 1 week(s).   Specialty: Family Medicine Contact information: [place]. Go in 2 week(s).   Contact information: [place]. Go in 1 week(s).   Contact information: [address] [place] [address] [phone]                No Known Allergies  Consultations: Gastroenterology   Procedures/Studies: CT Abdomen Pelvis Wo Contrast  Result Date: 02/25/2021 CLINICAL DATA:  Diarrhea for 4 days. Leukocytosis. Acute kidney injury. EXAM: CT ABDOMEN AND PELVIS WITHOUT CONTRAST TECHNIQUE: Multidetector CT imaging of the abdomen and pelvis was performed following the standard protocol without IV contrast. COMPARISON:  09/30/2020 FINDINGS: Lower chest: No acute findings. Hepatobiliary: No mass visualized on this unenhanced exam. Gallbladder is distended but otherwise unremarkable in appearance. No evidence of biliary ductal dilatation. Pancreas: 2 adjacent lesions are seen in the pancreatic tail, largest measuring 2.5 cm, which cannot be characterized on this unenhanced exam. No evidence of peripancreatic inflammatory changes. Spleen:  Within normal limits in size. Adrenals/Urinary tract: Severe diffuse right renal atrophy is seen. Multiple left renal cysts are stable including a few hemorrhagic cysts. No evidence of ureteral calculi or hydronephrosis. Diffuse bladder wall thickening is again seen, consistent with chronic bladder outlet obstruction. A few tiny less than 5 mm calculi are noted in the urinary bladder. Stomach/Bowel: Stomach is distended, but small and large bowel are nondilated. No evidence of obstruction, inflammatory process, or abnormal fluid collections. Vascular/Lymphatic: No pathologically enlarged lymph nodes identified. No evidence of abdominal aortic aneurysm.  Reproductive:  Stable markedly enlarged prostate. Other:  None. Musculoskeletal:  No suspicious bone lesions identified. IMPRESSION: Markedly distended stomach. Gastroparesis or gastric outlet obstruction cannot be excluded. Stable markedly enlarged prostate and findings of chronic bladder outlet obstruction. Tiny less than 5 mm urinary bladder calculi. No evidence of ureteral calculi or hydronephrosis. 2 adjacent lesions in pancreatic tail, largest  measuring 2.5 cm, which cannot be characterized on this unenhanced exam. Pancreas protocol abdomen MRI or CT without and with contrast recommended for further characterization. Electronically Signed   By: Marlaine Hind M.D.   On: 02/25/2021 16:52   US RENAL  Result Date: 02/26/2021 CLINICAL DATA:  Acute kidney injury, history diabetes mellitus, coronary artery disease EXAM: RENAL / URINARY TRACT ULTRASOUND COMPLETE COMPARISON:  CT abdomen pelvis without contrast 02/25/2021 FINDINGS: Right Kidney: Renal measurements: 5.8 x 3.6 x 2.1 cm = volume: 23 mL. Small and atrophic with thinned echogenic cortex. No mass or hydronephrosis. Left Kidney: Renal measurements: 17.6 x 7.4 x 7.5 cm = volume: 509 mL. Normal cortical thickness. Increased cortical echogenicity. Exophytic cyst at lower kidney 6.2 x 5.6 x 6.1 cm. Additional simple cyst at lower kidney 4.2 x 3.4 x 3.4 cm. Multiple additional smaller cysts are identified. Total of 11 cysts are noted. No hydronephrosis or shadowing calcification. Bladder: Mild bladder wall thickening. Other: Significantly enlarged prostate gland extending into the bladder base, prostate gland measuring 6.6 x 8.9 x 10.5 cm a calculated volume of 323 mL. IMPRESSION: Atrophic RIGHT kidney. Medical renal disease lytic changes LEFT kidney with multiple cysts up to 6.2 cm diameter. Markedly enlarged prostate gland with associated bladder wall thickening which may reflect chronic outlet obstruction. Electronically Signed   By: Lavonia Dana M.D.   On: 02/26/2021 17:16   DG Abdomen Acute W/Chest  Result Date: 02/17/2021 CLINICAL DATA:  Abdomen pain constipation EXAM: DG ABDOMEN ACUTE WITH 1 VIEW CHEST COMPARISON:  CT 09/30/2020 FINDINGS: Single-view chest demonstrates no focal opacity or pleural effusion. Normal cardiomediastinal silhouette. No pneumothorax. Supine and upright views of the abdomen demonstrate no free air beneath the diaphragm. Moderate gastric distension. Nonspecific decreased  small bowel gas. Mild to moderate stool in the right colon. No radiopaque calculi over the kidneys. Coarse calcifications in the region of the prostate gland IMPRESSION: 1. No radiographic evidence for acute cardiopulmonary abnormality. 2. Nonobstructed gas pattern with overall decreased small bowel gas. Mild to moderate stool in the right colon 3. Moderate gastric distension Electronically Signed   By: Donavan Foil M.D.   On: 02/17/2021 15:14   DG C-Arm 1-60 Min  Result Date: 03/02/2021 CLINICAL DATA:  Duodenal stricture, dilatation EXAM: DG C-ARM 1-60 MIN CONTRAST:  Please refer to operative records for type and volume of contrast utilized FLUOROSCOPY TIME:  Fluoroscopy Time:  7 minutes 16 seconds Radiation Exposure Index (if provided by the fluoroscopic device): 222.6 mGy Number of Acquired Spot Images: Insert spots COMPARISON:  CT abdomen and pelvis 02/25/2021 FINDINGS: I was present in the operating room during the imaging. Guidewire was utilized during endoscopy to traverse a duodenal stricture. Contrast injection demonstrated narrowing of the proximal second portion of the duodenum just distal to the duodenal bulb. The duodenal stricture was sequentially dilated by a 6 mm balloon, then to 8 mm and finally to 10 mm diameter. IMPRESSION: Intraoperative fluoroscopy during dilatation of duodenal stricture. Electronically Signed   By: Lavonia Dana M.D.   On: 03/02/2021 15:39   ECHOCARDIOGRAM COMPLETE  Result Date: 02/26/2021    ECHOCARDIOGRAM REPORT  Patient Name:   JARETT DRALLE Date of Exam: 02/26/2021 Medical Rec #:  [id number]    Height:       75.0 in Accession #:    [id number]   Weight:       230.6 lb Date of Birth:  06-18-1951   BSA:          2.331 m Patient Age:    33 years     BP:           123/72 mmHg Patient Gender: M            HR:           100 bpm. Exam Location:  [facility] Procedure: 2D Echo, Cardiac Doppler and Color Doppler Indications:    Atrial Fibrillation  History:        Patient has no  prior history of Echocardiogram examinations.                 Risk Factors:Diabetes.  Sonographer:    Wenda Low Referring Phys: [id number] [place]  1. Left ventricular ejection fraction, by estimation, is 55 to 60%. The left ventricle has normal function. The left ventricle has no regional wall motion abnormalities. There is mild left ventricular hypertrophy. Left ventricular diastolic parameters were normal.  2. Right ventricular systolic function is normal. The right ventricular size is normal. There is normal pulmonary artery systolic pressure.  3. The mitral valve is normal in structure. Trivial mitral valve regurgitation.  4. The aortic valve is tricuspid. Aortic valve regurgitation is not visualized. Mild aortic valve sclerosis is present, with no evidence of aortic valve stenosis. FINDINGS  Left Ventricle: Left ventricular ejection fraction, by estimation, is 55 to 60%. The left ventricle has normal function. The left ventricle has no regional wall motion abnormalities. The left ventricular internal cavity size was normal in size. There is  mild left ventricular hypertrophy. Left ventricular diastolic parameters were normal. Right Ventricle: The right ventricular size is normal. Right vetricular wall thickness was not assessed. Right ventricular systolic function is normal. There is normal pulmonary artery systolic pressure. The tricuspid regurgitant velocity is 2.67 m/s, and with an assumed right atrial pressure of 3 mmHg, the estimated right ventricular systolic pressure is 02.5 mmHg. Left Atrium: Left atrial size was normal in size. Right Atrium: Right atrial size was normal in size. Pericardium: There is no evidence of pericardial effusion. Mitral Valve: The mitral valve is normal in structure. Trivial mitral valve regurgitation. MV peak gradient, 1.8 mmHg. The mean mitral valve gradient is 1.0 mmHg. Tricuspid Valve: The tricuspid valve is normal in structure. Tricuspid valve  regurgitation is trivial. Aortic Valve: The aortic valve is tricuspid. Aortic valve regurgitation is not visualized. Mild aortic valve sclerosis is present, with no evidence of aortic valve stenosis. Aortic valve mean gradient measures 2.0 mmHg. Aortic valve peak gradient measures 3.5 mmHg. Aortic valve area, by VTI measures 2.39 cm. Pulmonic Valve: The pulmonic valve was normal in structure. Pulmonic valve regurgitation is not visualized. Aorta: The aortic root is normal in size and structure. IAS/Shunts: No atrial level shunt detected by color flow Doppler.  LEFT VENTRICLE PLAX 2D LVIDd:         3.95 cm  Diastology LVIDs:         2.93 cm  LV e' medial:    6.13 cm/s LV PW:         1.21 cm  LV E/e' medial:  7.0 LV IVS:  1.03 cm  LV e' lateral:   13.60 cm/s LVOT diam:     2.00 cm  LV E/e' lateral: 3.2 LV SV:         47 LV SV Index:   20 LVOT Area:     3.14 cm  RIGHT VENTRICLE RV Basal diam:  3.79 cm RV Mid diam:    2.90 cm LEFT ATRIUM           Index       RIGHT ATRIUM           Index LA diam:      3.20 cm 1.37 cm/m  RA Area:     17.30 cm LA Vol (A2C): 26.3 ml 11.28 ml/m RA Volume:   49.10 ml  21.06 ml/m LA Vol (A4C): 29.0 ml 12.44 ml/m  AORTIC VALVE AV Area (Vmax):    2.64 cm AV Area (Vmean):   2.43 cm AV Area (VTI):     2.39 cm AV Vmax:           93.30 cm/s AV Vmean:          64.700 cm/s AV VTI:            0.197 m AV Peak Grad:      3.5 mmHg AV Mean Grad:      2.0 mmHg LVOT Vmax:         78.30 cm/s LVOT Vmean:        50.000 cm/s LVOT VTI:          0.150 m LVOT/AV VTI ratio: 0.76  AORTA Ao Root diam: 3.40 cm Ao Asc diam:  3.60 cm MITRAL VALVE               TRICUSPID VALVE MV Area (PHT): 3.74 cm    TR Peak grad:   28.5 mmHg MV Area VTI:   3.34 cm    TR Vmax:        267.00 cm/s MV Peak grad:  1.8 mmHg MV Mean grad:  1.0 mmHg    SHUNTS MV Vmax:       0.68 m/s    Systemic VTI:  0.15 m MV Vmean:      33.0 cm/s   Systemic Diam: 2.00 cm MV Decel Time: 203 msec MV E velocity: 43.20 cm/s MV A velocity:  60.60 cm/s MV E/A ratio:  0.71 Dorris Carnes MD Electronically signed by Dorris Carnes MD Signature Date/Time: 02/26/2021/5:05:56 PM    Final    US ABDOMEN RUQ W/ELASTOGRAPHY  Result Date: 03/04/2021 CLINICAL DATA:  History of esophageal varices. EXAM: US ABDOMEN LIMITED - RIGHT UPPER QUADRANT ULTRASOUND HEPATIC ELASTOGRAPHY TECHNIQUE: Sonography of the right upper quadrant was performed. In addition, ultrasound elastography evaluation of the liver was performed. A region of interest was placed within the right lobe of the liver. Following application of a compressive sonographic pulse, tissue compressibility was assessed. Multiple assessments were performed at the selected site. Median tissue compressibility was determined. Previously, hepatic stiffness was assessed by shear wave velocity. Based on recently published Society of Radiologists in Ultrasound consensus article, reporting is now recommended to be performed in the SI units of pressure (kiloPascals) representing hepatic stiffness/elasticity. The obtained result is compared to the published reference standards. (cACLD = compensated Advanced Chronic Liver Disease) COMPARISON:  CT of the abdomen and pelvis from February 25, 2021., ultrasound evaluation, renal sonogram of February 26, 2021. FINDINGS: ULTRASOUND ABDOMEN LIMITED RIGHT UPPER QUADRANT Gallbladder: Gallbladder is distended with echogenic material in the lumen. No  gallbladder wall thickening. No pericholecystic fluid. No reported tenderness over the gallbladder. Common bile duct: Diameter: 8.2 mm Liver: Increased hepatic echogenicity. No visible lesion on submitted images. No gross nodularity of hepatic contour. Portal vein is patent on color Doppler imaging with normal direction of blood flow towards the liver. ULTRASOUND HEPATIC ELASTOGRAPHY Device: Siemens Helix VTQ Patient position: Oblique and supine were sampled with oblique yielding best results. Reported below. Transducer 4V1 Number of measurements: 10  Hepatic segment:  Subsegment VIII Median kPa: 3.0 IQR: 0.3 IQR/Median kPa ratio: 0.1 Data quality:  Good Diagnostic category:  < or = 5 kPa: high probability of being normal The use of hepatic elastography is applicable to patients with viral hepatitis and non-alcoholic fatty liver disease. At this time, there is insufficient data for the referenced cut-off values and use in other causes of liver disease, including alcoholic liver disease. Patients, however, may be assessed by elastography and serve as their own reference standard/baseline. In patients with non-alcoholic liver disease, the values suggesting compensated advanced chronic liver disease (cACLD) may be lower, and patients may need additional testing with elasticity results of 7-9 kPa. Please note that abnormal hepatic elasticity and shear wave velocities may also be identified in clinical settings other than with hepatic fibrosis, such as: acute hepatitis, elevated right heart and central venous pressures including use of beta blockers, veno-occlusive disease (Budd-Chiari), infiltrative processes such as mastocytosis/amyloidosis/infiltrative tumor/lymphoma, extrahepatic cholestasis, with hyperemia in the post-prandial state, and with liver transplantation. Correlation with patient history, laboratory data, and clinical condition recommended. Diagnostic Categories: < or =5 kPa: high probability of being normal < or =9 kPa: in the absence of other known clinical signs, rules out cACLD >9 kPa and ?13 kPa: suggestive of cACLD, but needs further testing >13 kPa: highly suggestive of cACLD > or =17 kPa: highly suggestive of cACLD with an increased probability of clinically significant portal hypertension IMPRESSION: ULTRASOUND RUQ: Sludge and stones in the gallbladder. No acute findings relative to the gallbladder. Mild dilation of the common bile duct at 8 mm. Sided transition not visible. MRI/MRCP with and without contrast may be helpful for further  evaluation particularly if there are continued RIGHT upper quadrant symptoms and given lack of clear explanation for reported varices. ULTRASOUND HEPATIC ELASTOGRAPHY: Median kPa:  3.0 Diagnostic category:  < or = 5 kPa: high probability of being normal Electronically Signed   By: Zetta Bills M.D.   On: 03/04/2021 12:36     Discharge Exam: Vitals:   03/04/21 0522 03/04/21 1325  BP: 131/76 137/71  Pulse: 64 68  Resp: 18 18  Temp: 98.6 F (37 C) 98.7 F (37.1 C)  SpO2: 96% 97%   Vitals:   03/03/21 1347 03/03/21 2026 03/04/21 0522 03/04/21 1325  BP: 129/79 127/76 131/76 137/71  Pulse: 67 69 64 68  Resp: 17 18 18 18   Temp: 98.3 F (36.8 C) 99 F (37.2 C) 98.6 F (37 C) 98.7 F (37.1 C)  TempSrc: Oral Oral Oral Oral  SpO2: 96% 96% 96% 97%  Weight:      Height:        General: Pt is alert, awake, not in acute distress Cardiovascular: RRR, S1/S2 +, no rubs, no gallops Respiratory: CTA bilaterally, no wheezing, no rhonchi Abdominal: Soft, NT, ND, bowel sounds + Extremities: no edema, no cyanosis    The results of significant diagnostics from this hospitalization (including imaging, microbiology, ancillary and laboratory) are listed below for reference.     Microbiology: Recent Results (from  the past 240 hour(s))  Resp Panel by RT-PCR (Flu A&B, Covid) Nasopharyngeal Swab     Status: None   Collection Time: 02/25/21  2:54 PM   Specimen: Nasopharyngeal Swab; Nasopharyngeal(NP) swabs in vial transport medium  Result Value Ref Range Status   SARS Coronavirus 2 by RT PCR NEGATIVE NEGATIVE Final    Comment: (NOTE) SARS-CoV-2 target nucleic acids are NOT DETECTED.  The SARS-CoV-2 RNA is generally detectable in upper respiratory specimens during the acute phase of infection. The lowest concentration of SARS-CoV-2 viral copies this assay can detect is 138 copies/mL. A negative result does not preclude SARS-Cov-2 infection and should not be used as the sole basis for treatment  or other patient management decisions. A negative result may occur with  improper specimen collection/handling, submission of specimen other than nasopharyngeal swab, presence of viral mutation(s) within the areas targeted by this assay, and inadequate number of viral copies(<138 copies/mL). A negative result must be combined with clinical observations, patient history, and epidemiological information. The expected result is Negative.  Fact Sheet for Patients:  EntrepreneurPulse.com.au  Fact Sheet for Healthcare Providers:  IncredibleEmployment.be  This test is no t yet approved or cleared by the [place] FDA and  has been authorized for detection and/or diagnosis of SARS-CoV-2 by FDA under an Emergency Use Authorization (EUA). This EUA will remain  in effect (meaning this test can be used) for the duration of the COVID-19 declaration under Section 564(b)(1) of the Act, 21 U.S.C.section 360bbb-3(b)(1), unless the authorization is terminated  or revoked sooner.       Influenza A by PCR NEGATIVE NEGATIVE Final   Influenza B by PCR NEGATIVE NEGATIVE Final    Comment: (NOTE) The Xpert Xpress SARS-CoV-2/FLU/RSV plus assay is intended as an aid in the diagnosis of influenza from Nasopharyngeal swab specimens and should not be used as a sole basis for treatment. Nasal washings and aspirates are unacceptable for Xpert Xpress SARS-CoV-2/FLU/RSV testing.  Fact Sheet for Patients: EntrepreneurPulse.com.au  Fact Sheet for Healthcare Providers: IncredibleEmployment.be  This test is not yet approved or cleared by the [place] FDA and has been authorized for detection and/or diagnosis of SARS-CoV-2 by FDA under an Emergency Use Authorization (EUA). This EUA will remain in effect (meaning this test can be used) for the duration of the COVID-19 declaration under Section 564(b)(1) of the Act, 21 U.S.C. section  360bbb-3(b)(1), unless the authorization is terminated or revoked.  Performed at [facility], [address] [place], [place] [address]   Urine culture     Status: None   Collection Time: 02/25/21  3:17 PM   Specimen: Urine, Clean Catch  Result Value Ref Range Status   Specimen Description   Final    URINE, CLEAN CATCH Performed at [facility], [address], [place], [place] [address]    Special Requests   Final    NONE Performed at [facility], [address], [place], [place] [address]    Culture   Final    NO GROWTH Performed at [facility], [place] [address], [place],  [address]    Report Status 02/27/2021 FINAL  Final  C Difficile Quick Screen w PCR reflex     Status: None   Collection Time: 02/25/21  4:31 PM   Specimen: STOOL  Result Value Ref Range Status   C Diff antigen NEGATIVE NEGATIVE Final   C Diff toxin NEGATIVE NEGATIVE Final   C Diff interpretation No C. difficile detected.  Final  Comment: Performed at [facility] [facility], [address] [place], [place] [address]  Gastrointestinal Panel by PCR , Stool     Status: None   Collection Time: 02/25/21  4:31 PM   Specimen: STOOL  Result Value Ref Range Status   Campylobacter species NOT DETECTED NOT DETECTED Final   Plesimonas shigelloides NOT DETECTED NOT DETECTED Final   Salmonella species NOT DETECTED NOT DETECTED Final   Yersinia enterocolitica NOT DETECTED NOT DETECTED Final   Vibrio species NOT DETECTED NOT DETECTED Final   Vibrio cholerae NOT DETECTED NOT DETECTED Final   Enteroaggregative E coli (EAEC) NOT DETECTED NOT DETECTED Final   Enteropathogenic E coli (EPEC) NOT DETECTED NOT DETECTED Final   Enterotoxigenic E coli (ETEC) NOT DETECTED NOT DETECTED Final   Shiga like toxin producing E coli (STEC) NOT DETECTED NOT DETECTED Final   Shigella/Enteroinvasive E coli (EIEC) NOT DETECTED NOT DETECTED Final   Cryptosporidium NOT DETECTED NOT DETECTED Final   Cyclospora cayetanensis NOT DETECTED  NOT DETECTED Final   Entamoeba histolytica NOT DETECTED NOT DETECTED Final   Giardia lamblia NOT DETECTED NOT DETECTED Final   Adenovirus F40/41 NOT DETECTED NOT DETECTED Final   Astrovirus NOT DETECTED NOT DETECTED Final   Norovirus GI/GII NOT DETECTED NOT DETECTED Final   Rotavirus A NOT DETECTED NOT DETECTED Final   Sapovirus (I, II, IV, and V) NOT DETECTED NOT DETECTED Final    Comment: Performed at [facility], [address] [place], [place] [address]  Blood culture (routine x 2)     Status: None   Collection Time: 02/25/21  6:00 PM   Specimen: Left Antecubital; Blood  Result Value Ref Range Status   Specimen Description LEFT ANTECUBITAL  Final   Special Requests   Final    BOTTLES DRAWN AEROBIC AND ANAEROBIC Blood Culture adequate volume   Culture   Final    NO GROWTH 5 DAYS Performed at [facility], [address], [place], [place] [address]    Report Status 03/02/2021 FINAL  Final  Blood culture (routine x 2)     Status: None   Collection Time: 02/25/21  6:15 PM   Specimen: Left Antecubital; Blood  Result Value Ref Range Status   Specimen Description LEFT ANTECUBITAL  Final   Special Requests   Final    BOTTLES DRAWN AEROBIC AND ANAEROBIC Blood Culture adequate volume   Culture   Final    NO GROWTH 5 DAYS Performed at [facility], [address] [place], [place] [address]    Report Status 03/02/2021 FINAL  Final     Labs: BNP (last 3 results) No results for input(s): BNP in the last 8760 hours. Basic Metabolic Panel: Recent Labs  Lab 02/27/21 0503 02/28/21 0358 03/01/21 0638 03/03/21 0600 03/04/21 0538  NA 136 137 140 142 140  K 3.2* 2.9* 3.3* 3.8 3.9  CL 110 105 108 109 105  CO2 19* 25 27 27 28   GLUCOSE 103* 84 101* 92 94  BUN 49* 34* 18 9 7*  CREATININE 1.82* 1.32* 1.08 0.97 1.01  CALCIUM 7.8* 7.5* 8.1* 8.3* 8.6*  MG 1.5* 2.2 2.2 1.8 1.8   Liver Function Tests: Recent Labs  Lab 02/28/21 0358 03/03/21 0600  AST  --  18  ALT  --  18   ALKPHOS  --  49  BILITOT  --  0.8  PROT  --  4.9*  ALBUMIN 2.4* 2.7*   No results for input(s): LIPASE, AMYLASE in the last 168 hours. No  results for input(s): AMMONIA in the last 168 hours. CBC: Recent Labs  Lab 02/26/21 0436 02/27/21 0503 03/03/21 0600 03/04/21 0538  WBC 19.6* 13.2* 7.7 9.0  NEUTROABS  --  9.4* 3.9  --   HGB 14.6 14.6 11.2* 11.6*  HCT 43.3 43.3 34.1* 35.1*  MCV 93.5 92.9 96.1 95.9  PLT 262 280 190 199   Cardiac Enzymes: No results for input(s): CKTOTAL, CKMB, CKMBINDEX, TROPONINI in the last 168 hours. BNP: Invalid input(s): POCBNP CBG: Recent Labs  Lab 03/03/21 1137 03/03/21 1718 03/03/21 2142 03/04/21 0720 03/04/21 1108  GLUCAP 107* 101* 114* 94 102*   D-Dimer No results for input(s): DDIMER in the last 72 hours. Hgb A1c No results for input(s): HGBA1C in the last 72 hours. Lipid Profile No results for input(s): CHOL, HDL, LDLCALC, TRIG, CHOLHDL, LDLDIRECT in the last 72 hours. Thyroid function studies No results for input(s): TSH, T4TOTAL, T3FREE, THYROIDAB in the last 72 hours.  Invalid input(s): FREET3 Anemia work up No results for input(s): VITAMINB12, FOLATE, FERRITIN, TIBC, IRON, RETICCTPCT in the last 72 hours. Urinalysis    Component Value Date/Time   COLORURINE AMBER (A) 02/25/2021 1424   APPEARANCEUR CLOUDY (A) 02/25/2021 1424   APPEARANCEUR Clear 01/15/2021 1534   LABSPEC 1.019 02/25/2021 1424   PHURINE 5.0 02/25/2021 1424   GLUCOSEU NEGATIVE 02/25/2021 1424   HGBUR SMALL (A) 02/25/2021 1424   BILIRUBINUR SMALL (A) 02/25/2021 1424   BILIRUBINUR Negative 01/15/2021 1534   KETONESUR NEGATIVE 02/25/2021 1424   PROTEINUR 100 (A) 02/25/2021 1424   NITRITE NEGATIVE 02/25/2021 1424   LEUKOCYTESUR LARGE (A) 02/25/2021 1424   Sepsis Labs Invalid input(s): PROCALCITONIN,  WBC,  LACTICIDVEN Microbiology Recent Results (from the past 240 hour(s))  Resp Panel by RT-PCR (Flu A&B, Covid) Nasopharyngeal Swab     Status: None    Collection Time: 02/25/21  2:54 PM   Specimen: Nasopharyngeal Swab; Nasopharyngeal(NP) swabs in vial transport medium  Result Value Ref Range Status   SARS Coronavirus 2 by RT PCR NEGATIVE NEGATIVE Final    Comment: (NOTE) SARS-CoV-2 target nucleic acids are NOT DETECTED.  The SARS-CoV-2 RNA is generally detectable in upper respiratory specimens during the acute phase of infection. The lowest concentration of SARS-CoV-2 viral copies this assay can detect is 138 copies/mL. A negative result does not preclude SARS-Cov-2 infection and should not be used as the sole basis for treatment or other patient management decisions. A negative result may occur with  improper specimen collection/handling, submission of specimen other than nasopharyngeal swab, presence of viral mutation(s) within the areas targeted by this assay, and inadequate number of viral copies(<138 copies/mL). A negative result must be combined with clinical observations, patient history, and epidemiological information. The expected result is Negative.  Fact Sheet for Patients:  EntrepreneurPulse.com.au  Fact Sheet for Healthcare Providers:  IncredibleEmployment.be  This test is no t yet approved or cleared by the [place] FDA and  has been authorized for detection and/or diagnosis of SARS-CoV-2 by FDA under an Emergency Use Authorization (EUA). This EUA will remain  in effect (meaning this test can be used) for the duration of the COVID-19 declaration under Section 564(b)(1) of the Act, 21 U.S.C.section 360bbb-3(b)(1), unless the authorization is terminated  or revoked sooner.       Influenza A by PCR NEGATIVE NEGATIVE Final   Influenza B by PCR NEGATIVE NEGATIVE Final    Comment: (NOTE) The Xpert Xpress SARS-CoV-2/FLU/RSV plus assay is intended as an aid in the diagnosis of influenza from  Nasopharyngeal swab specimens and should not be used as a sole basis for treatment.  Nasal washings and aspirates are unacceptable for Xpert Xpress SARS-CoV-2/FLU/RSV testing.  Fact Sheet for Patients: EntrepreneurPulse.com.au  Fact Sheet for Healthcare Providers: IncredibleEmployment.be  This test is not yet approved or cleared by the [place] FDA and has been authorized for detection and/or diagnosis of SARS-CoV-2 by FDA under an Emergency Use Authorization (EUA). This EUA will remain in effect (meaning this test can be used) for the duration of the COVID-19 declaration under Section 564(b)(1) of the Act, 21 U.S.C. section 360bbb-3(b)(1), unless the authorization is terminated or revoked.  Performed at [facility], [address] [place], [place] [address]   Urine culture     Status: None   Collection Time: 02/25/21  3:17 PM   Specimen: Urine, Clean Catch  Result Value Ref Range Status   Specimen Description   Final    URINE, CLEAN CATCH Performed at [facility], [address], [place], [place] [address]    Special Requests   Final    NONE Performed at [facility], [address], [place], [place] [address]    Culture   Final    NO GROWTH Performed at [facility], [place] [address] [place], [place] [address]    Report Status 02/27/2021 FINAL  Final  C Difficile Quick Screen w PCR reflex     Status: None   Collection Time: 02/25/21  4:31 PM   Specimen: STOOL  Result Value Ref Range Status   C Diff antigen NEGATIVE NEGATIVE Final   C Diff toxin NEGATIVE NEGATIVE Final   C Diff interpretation No C. difficile detected.  Final    Comment: Performed at [facility], [address], [place], [place] [address]  Gastrointestinal Panel by PCR , Stool     Status: None   Collection Time: 02/25/21  4:31 PM   Specimen: STOOL  Result Value Ref Range Status   Campylobacter species NOT DETECTED NOT DETECTED Final   Plesimonas shigelloides NOT DETECTED NOT DETECTED Final   Salmonella species NOT DETECTED NOT DETECTED  Final   Yersinia enterocolitica NOT DETECTED NOT DETECTED Final   Vibrio species NOT DETECTED NOT DETECTED Final   Vibrio cholerae NOT DETECTED NOT DETECTED Final   Enteroaggregative E coli (EAEC) NOT DETECTED NOT DETECTED Final   Enteropathogenic E coli (EPEC) NOT DETECTED NOT DETECTED Final   Enterotoxigenic E coli (ETEC) NOT DETECTED NOT DETECTED Final   Shiga like toxin producing E coli (STEC) NOT DETECTED NOT DETECTED Final   Shigella/Enteroinvasive E coli (EIEC) NOT DETECTED NOT DETECTED Final   Cryptosporidium NOT DETECTED NOT DETECTED Final   Cyclospora cayetanensis NOT DETECTED NOT DETECTED Final   Entamoeba histolytica NOT DETECTED NOT DETECTED Final   Giardia lamblia NOT DETECTED NOT DETECTED Final   Adenovirus F40/41 NOT DETECTED NOT DETECTED Final   Astrovirus NOT DETECTED NOT DETECTED Final   Norovirus GI/GII NOT DETECTED NOT DETECTED Final   Rotavirus A NOT DETECTED NOT DETECTED Final   Sapovirus (I, II, IV, and V) NOT DETECTED NOT DETECTED Final    Comment: Performed at [facility], [address] [place],  [address]  Blood culture (routine x 2)     Status: None   Collection Time: 02/25/21  6:00 PM   Specimen: Left Antecubital; Blood  Result Value Ref Range Status   Specimen Description LEFT ANTECUBITAL  Final   Special Requests   Final    BOTTLES DRAWN AEROBIC  AND ANAEROBIC Blood Culture adequate volume   Culture   Final    NO GROWTH 5 DAYS Performed at [facility], [address], [place], [place] [address]    Report Status 03/02/2021 FINAL  Final  Blood culture (routine x 2)     Status: None   Collection Time: 02/25/21  6:15 PM   Specimen: Left Antecubital; Blood  Result Value Ref Range Status   Specimen Description LEFT ANTECUBITAL  Final   Special Requests   Final    BOTTLES DRAWN AEROBIC AND ANAEROBIC Blood Culture adequate volume   Culture   Final    NO GROWTH 5 DAYS Performed at [facility], [address], [place], [place]  [address]    Report Status 03/02/2021 FINAL  Final     Time coordinating discharge: 35 minutes  SIGNED:   Rodena Goldmann [facility] 03/04/2021, 1:59 PM  If 7PM-7AM, please contact night-coverage [email or web address]

## 2021-03-04 NOTE — Plan of Care (Signed)
  Problem: Education: Goal: Knowledge of General Education information will improve Description: Including pain rating scale, medication(s)/side effects and non-pharmacologic comfort measures 03/04/2021 1359 by Melony Overly, RN Outcome: Adequate for Discharge 03/04/2021 0941 by Melony Overly, RN Outcome: Progressing   Problem: Health Behavior/Discharge Planning: Goal: Ability to manage health-related needs will improve 03/04/2021 1359 by Melony Overly, RN Outcome: Adequate for Discharge 03/04/2021 0941 by Melony Overly, RN Outcome: Progressing   Problem: Clinical Measurements: Goal: Ability to maintain clinical measurements within normal limits will improve 03/04/2021 1359 by Melony Overly, RN Outcome: Adequate for Discharge 03/04/2021 0941 by Melony Overly, RN Outcome: Progressing Goal: Will remain free from infection 03/04/2021 1359 by Melony Overly, RN Outcome: Adequate for Discharge 03/04/2021 0941 by Melony Overly, RN Outcome: Progressing Goal: Diagnostic test results will improve 03/04/2021 1359 by Melony Overly, RN Outcome: Adequate for Discharge 03/04/2021 0941 by Melony Overly, RN Outcome: Progressing Goal: Respiratory complications will improve 03/04/2021 1359 by Melony Overly, RN Outcome: Adequate for Discharge 03/04/2021 0941 by Melony Overly, RN Outcome: Progressing Goal: Cardiovascular complication will be avoided 03/04/2021 1359 by Melony Overly, RN Outcome: Adequate for Discharge 03/04/2021 0941 by Melony Overly, RN Outcome: Progressing   Problem: Activity: Goal: Risk for activity intolerance will decrease 03/04/2021 1359 by Melony Overly, RN Outcome: Adequate for Discharge 03/04/2021 0941 by Melony Overly, RN Outcome: Progressing   Problem: Nutrition: Goal: Adequate nutrition will be maintained 03/04/2021 1359 by Melony Overly, RN Outcome: Adequate for Discharge 03/04/2021 0941 by Melony Overly, RN Outcome: Progressing    Problem: Coping: Goal: Level of anxiety will decrease 03/04/2021 1359 by Melony Overly, RN Outcome: Adequate for Discharge 03/04/2021 0941 by Melony Overly, RN Outcome: Progressing   Problem: Elimination: Goal: Will not experience complications related to bowel motility 03/04/2021 1359 by Melony Overly, RN Outcome: Adequate for Discharge 03/04/2021 0941 by Melony Overly, RN Outcome: Progressing Goal: Will not experience complications related to urinary retention 03/04/2021 1359 by Melony Overly, RN Outcome: Adequate for Discharge 03/04/2021 0941 by Melony Overly, RN Outcome: Progressing   Problem: Pain Managment: Goal: General experience of comfort will improve 03/04/2021 1359 by Melony Overly, RN Outcome: Adequate for Discharge 03/04/2021 0941 by Melony Overly, RN Outcome: Progressing   Problem: Safety: Goal: Ability to remain free from injury will improve 03/04/2021 1359 by Melony Overly, RN Outcome: Adequate for Discharge 03/04/2021 0941 by Melony Overly, RN Outcome: Progressing   Problem: Skin Integrity: Goal: Risk for impaired skin integrity will decrease 03/04/2021 1359 by Melony Overly, RN Outcome: Adequate for Discharge 03/04/2021 0941 by Melony Overly, RN Outcome: Progressing

## 2021-03-04 NOTE — Progress Notes (Signed)
Subjective: Feels well.  Tolerating full liquids without any trouble.  Denies abdominal pain, nausea, vomiting.  No BM today, but no abdominal distention.  Passing flatus.  Urinary catheter has been removed.  States he is waiting to see if he can pass urine.  If so, he was told he can go home.  Patient tells me he is scheduled for an appointment to discuss scheduling a colonoscopy with [facility].  This will be his initial consult.  Discussed that we do not usually share patients in the outpatient setting.  Patient states he prefers to continue to keep his care with [facility].   Objective: Vital signs in last 24 hours: Temp:  [98.3 F (36.8 C)-99 F (37.2 C)] 98.6 F (37 C) (07/13 0522) Pulse Rate:  [64-69] 64 (07/13 0522) Resp:  [17-18] 18 (07/13 0522) BP: (127-131)/(76-79) 131/76 (07/13 0522) SpO2:  [96 %] 96 % (07/13 0522) Last BM Date: 03/02/21 General:   Alert and oriented, pleasant Head:  Normocephalic and atraumatic. Eyes:  No icterus, sclera clear. Conjuctiva pink.  Abdomen:  Bowel sounds present, soft, non-tender, non-distended. No HSM or hernias noted. No rebound or guarding. No masses appreciated  Msk:  Symmetrical without gross deformities. Normal posture. Extremities:  Without edema. Neurologic:  Alert and  oriented x4;  grossly normal neurologically. Skin:  Warm and dry, intact without significant lesions.  Psych:  Normal mood and affect.  Intake/Output from previous day: 07/12 0701 - 07/13 0700 In: 4960 [P.O.:960] Out: 5850 [Urine:5850] Intake/Output this shift: Total I/O In: -  Out: 950 [Urine:950]  Lab Results: Recent Labs    03/03/21 0600 03/04/21 0538  WBC 7.7 9.0  HGB 11.2* 11.6*  HCT 34.1* 35.1*  PLT 190 199   BMET Recent Labs    03/03/21 0600 03/04/21 0538  NA 142 140  K 3.8 3.9  CL 109 105  CO2 27 28  GLUCOSE 92 94  BUN 9 7*  CREATININE 0.97 1.01  CALCIUM 8.3* 8.6*   LFT Recent Labs    03/03/21 0600  PROT 4.9*  ALBUMIN 2.7*  AST  18  ALT 18  ALKPHOS 49  BILITOT 0.8   Studies/Results: DG C-Arm 1-60 Min  Result Date: 03/02/2021 CLINICAL DATA:  Duodenal stricture, dilatation EXAM: DG C-ARM 1-60 MIN CONTRAST:  Please refer to operative records for type and volume of contrast utilized FLUOROSCOPY TIME:  Fluoroscopy Time:  7 minutes 16 seconds Radiation Exposure Index (if provided by the fluoroscopic device): 222.6 mGy Number of Acquired Spot Images: Insert spots COMPARISON:  CT abdomen and pelvis 02/25/2021 FINDINGS: I was present in the operating room during the imaging. Guidewire was utilized during endoscopy to traverse a duodenal stricture. Contrast injection demonstrated narrowing of the proximal second portion of the duodenum just distal to the duodenal bulb. The duodenal stricture was sequentially dilated by a 6 mm balloon, then to 8 mm and finally to 10 mm diameter. IMPRESSION: Intraoperative fluoroscopy during dilatation of duodenal stricture. Electronically Signed   By: Lavonia Dana M.D.   On: 03/02/2021 15:39    Assessment:  70 year old male presenting to the ED with diarrhea, N/V, early satiety, inability to tolerate solid foods, with non-contrast CT revealing markedly distended stomach on admission. 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    Gastric outlet obstruction: EGD 7/8 with grade B esophagitis, nonbleeding gastric body ulcer biopsied with extensive superficial ulceration involving lesser curvature extending to prepyloric region biopsied, retained  food in duodenum removed, bulbar duodenitis with stricture that could not be transversed with ultraslim scope.  Repeat EGD 7/11 by Dr. Gala Romney who was able to advance a sphincterotome with 8 of fluoroscopy and dilate duodenal stricture up to 10 mm with a balloon s/p biopsies of stricture taken.  Patient had retained intact medication tablets that were removed and all oral medications were discontinued.  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  He denied NSAIDs.  Hypersecretory state has been ruled out with normal gastrin level.  Gastric biopsy was negative for H. pylori.  Celiac serologies are still pending.  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  He will need to continue PPI twice daily and have repeat EGD with dilation in 1-2 weeks.  Would recommend continuing full liquid diet for now and liquid and/or crushed medications until repeat EGD is performed.  Diarrhea: Resolved. GI pathogen panel and CDiff negative.   ?  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  No known history of liver disease.  CT A/P without contrast  this admission with no mention of cirrhosis.  Spleen normal.  Platelets and LFTs normal.  RUQ ultrasound with elastography was completed today and is pending.  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 Needs follow-up as outpatient non-urgent.   Plan: Continue full liquid diet. When resuming oral medications, these will need to be crushed or in liquid form. Continue PPI twice daily. Repeat EGD with dilation in 1-2 weeks.  We will arrange this with our office. Please note, patient had pending first appointment with [facility], but prefers to keep care with [facility].  Avoid all NSAIDs. Follow-up on celiac serologies. Follow-up on RUQ ultrasound with elastography. Will need outpatient follow-up of pancreatic lesions. Anticipate discharge later today.   LOS: 7 days    03/04/2021, 11:06 AM   Aliene Altes, [facility]

## 2021-03-05 ENCOUNTER — Encounter: Payer: Self-pay | Admitting: Internal Medicine

## 2021-03-05 ENCOUNTER — Telehealth: Payer: Self-pay | Admitting: Internal Medicine

## 2021-03-05 ENCOUNTER — Telehealth: Payer: Self-pay | Admitting: Gastroenterology

## 2021-03-05 ENCOUNTER — Telehealth: Payer: Self-pay

## 2021-03-05 ENCOUNTER — Ambulatory Visit: Payer: Medicare Other | Admitting: Gastroenterology

## 2021-03-05 NOTE — Telephone Encounter (Signed)
Patient is scheduled with [facility] for an OV to scheduled.

## 2021-03-05 NOTE — Telephone Encounter (Signed)
Wife called stating patient was having difficulty passing urine. Wife informed that if patient is unable to void he would need to go to ER for eval. Wife voiced understanding.

## 2021-03-05 NOTE — Telephone Encounter (Signed)
Bryan Vargas called for patient asking to schedule his EGD in 1-2 weeks per Dr Roseanne Kaufman recommendations in op note from July 11th. Please call [phone]

## 2021-03-05 NOTE — Telephone Encounter (Signed)
[facility] Clinical Pool: Patient was recently hospitalized with gastric outlet obstruction secondary to duodenal stricture.  Dr. Gala Romney performed very complex dilation of the duodenal stricture with recommendations to repeat EGD/dilation in 1-2 weeks. It is important to have the repeat EGD in this time interval. He will need propofol. ASA III.   Please note patient was scheduled for an appointment with [facility] prior to this hospitalization to discuss scheduling a colonoscopy.  He has never established [facility].  He has requested to keep his care with [facility].

## 2021-03-05 NOTE — Telephone Encounter (Signed)
Rourk, Cristopher Estimable, MD  Zeb Comfort D  Set him up with [facility].  If he does not want to do that just let him follow-up with [facility]    ---  FYI to [facility]. Manuela Schwartz already informed Dr. Olevia Perches office

## 2021-03-06 ENCOUNTER — Other Ambulatory Visit: Payer: Medicare Other

## 2021-03-06 ENCOUNTER — Other Ambulatory Visit: Payer: Self-pay

## 2021-03-06 ENCOUNTER — Telehealth: Payer: Self-pay

## 2021-03-06 DIAGNOSIS — N390 Urinary tract infection, site not specified: Secondary | ICD-10-CM

## 2021-03-06 LAB — URINALYSIS, ROUTINE W REFLEX MICROSCOPIC
Bilirubin, UA: NEGATIVE
Glucose, UA: NEGATIVE
Ketones, UA: NEGATIVE
Nitrite, UA: NEGATIVE
Specific Gravity, UA: 1.02 (ref 1.005–1.030)
Urobilinogen, Ur: 0.2 mg/dL (ref 0.2–1.0)
pH, UA: 6 (ref 5.0–7.5)

## 2021-03-06 LAB — MICROSCOPIC EXAMINATION
Bacteria, UA: NONE SEEN
WBC, UA: >30 /hpf — AB (ref 0–5)

## 2021-03-06 MED ORDER — NITROFURANTOIN MONOHYD MACRO 100 MG PO CAPS: 100.0000 mg | ORAL_CAPSULE | Freq: Two times a day (BID) | ORAL | 0 refills | Status: DC

## 2021-03-07 ENCOUNTER — Other Ambulatory Visit: Payer: Self-pay

## 2021-03-07 ENCOUNTER — Encounter (HOSPITAL_COMMUNITY): Payer: Self-pay

## 2021-03-07 ENCOUNTER — Emergency Department (HOSPITAL_COMMUNITY)
Admission: EM | Admit: 2021-03-07 | Discharge: 2021-03-07 | Disposition: A | Discharge status: Home / Self Care | Payer: Medicare Other | Attending: Emergency Medicine | Admitting: Emergency Medicine

## 2021-03-07 DIAGNOSIS — Z7984 Long term (current) use of oral hypoglycemic drugs: Secondary | ICD-10-CM | POA: Insufficient documentation

## 2021-03-07 DIAGNOSIS — I2510 Atherosclerotic heart disease of native coronary artery without angina pectoris: Secondary | ICD-10-CM | POA: Insufficient documentation

## 2021-03-07 DIAGNOSIS — R339 Retention of urine, unspecified: Secondary | ICD-10-CM | POA: Diagnosis not present

## 2021-03-07 DIAGNOSIS — E119 Type 2 diabetes mellitus without complications: Secondary | ICD-10-CM | POA: Diagnosis not present

## 2021-03-07 DIAGNOSIS — Z7982 Long term (current) use of aspirin: Secondary | ICD-10-CM | POA: Insufficient documentation

## 2021-03-07 LAB — URINALYSIS, ROUTINE W REFLEX MICROSCOPIC
Bilirubin Urine: NEGATIVE
Glucose, UA: NEGATIVE mg/dL
Ketones, ur: 5 mg/dL — AB
Leukocytes,Ua: NEGATIVE
Nitrite: NEGATIVE
Protein, ur: 30 mg/dL — AB
Specific Gravity, Urine: 1.013 (ref 1.005–1.030)
pH: 6 (ref 5.0–8.0)

## 2021-03-07 LAB — COMPREHENSIVE METABOLIC PANEL
ALT: 22 U/L (ref 0–44)
AST: 25 U/L (ref 15–41)
Albumin: 3.5 g/dL (ref 3.5–5.0)
Alkaline Phosphatase: 58 U/L (ref 38–126)
Anion gap: 9 (ref 5–15)
BUN: 25 mg/dL — ABNORMAL HIGH (ref 8–23)
CO2: 24 mmol/L (ref 22–32)
Calcium: 10 mg/dL (ref 8.9–10.3)
Chloride: 103 mmol/L (ref 98–111)
Creatinine, Ser: 1.28 mg/dL — ABNORMAL HIGH (ref 0.61–1.24)
GFR, Estimated: >60 mL/min (ref >60)
Glucose, Bld: 110 mg/dL — ABNORMAL HIGH (ref 70–99)
Potassium: 4 mmol/L (ref 3.5–5.1)
Sodium: 136 mmol/L (ref 135–145)
Total Bilirubin: 1 mg/dL (ref 0.3–1.2)
Total Protein: 6.6 g/dL (ref 6.5–8.1)

## 2021-03-07 LAB — CBC WITH DIFFERENTIAL/PLATELET
Abs Immature Granulocytes: 0.08 10*3/uL — ABNORMAL HIGH (ref 0.00–0.07)
Basophils Absolute: 0.1 10*3/uL (ref 0.0–0.1)
Basophils Relative: 0 %
Eosinophils Absolute: 2.1 10*3/uL — ABNORMAL HIGH (ref 0.0–0.5)
Eosinophils Relative: 18 %
HCT: 38.4 % — ABNORMAL LOW (ref 39.0–52.0)
Hemoglobin: 12.7 g/dL — ABNORMAL LOW (ref 13.0–17.0)
Immature Granulocytes: 1 %
Lymphocytes Relative: 8 %
Lymphs Abs: 0.9 10*3/uL (ref 0.7–4.0)
MCH: 31.7 pg (ref 26.0–34.0)
MCHC: 33.1 g/dL (ref 30.0–36.0)
MCV: 95.8 fL (ref 80.0–100.0)
Monocytes Absolute: 0.9 10*3/uL (ref 0.1–1.0)
Monocytes Relative: 7 %
Neutro Abs: 7.9 10*3/uL — ABNORMAL HIGH (ref 1.7–7.7)
Neutrophils Relative %: 66 %
Platelets: 188 10*3/uL (ref 150–400)
RBC: 4.01 MIL/uL — ABNORMAL LOW (ref 4.22–5.81)
RDW: 13.3 % (ref 11.5–15.5)
WBC: 11.9 10*3/uL — ABNORMAL HIGH (ref 4.0–10.5)
nRBC: 0 % (ref 0.0–0.2)

## 2021-03-07 NOTE — ED Triage Notes (Signed)
Pt reports recent hospitalization here, catheter was placed Tuesday and removed Wednesday due to urinary retention, pt was also started on Nitrofurantoin 100mg  twice daily for UTI yesterday. Pt says he is unable to urinate, bladder scan reveals > 999.

## 2021-03-07 NOTE — ED Provider Notes (Signed)
[facility] EMERGENCY DEPARTMENT Provider Note   CSN: [id number] Arrival date & time: 03/07/21  5462     History Chief Complaint  Patient presents with   Urinary Retention    Bryan Vargas is a 70 y.o. male.  Patient with a history of diabetes, CAD, recent extensive hospitalization for gastric outlet obstruction with duodenal stricture, urinary retention presenting with inability to urinate since yesterday.  Catheter was discontinued 2 days ago.  Since then he is "only able to urinate small amounts and having to walk around to urinate".  Feels like his bladder is full and he cannot empty it.  Denies fevers, chills, nausea or vomiting.  No abdominal pain or shortness of breath. Dropped off urine sample at the urologist office yesterday was given a prescription for [facility] which he has had 1 dose. Feels better after catheter placed prior to my assessment.  Denies testicular pain or back pain.  Denies chest pain or shortness of breath.  The history is provided by the patient.      Past Medical History:  Diagnosis Date   Arthritis    Coronary artery calcification seen on CAT scan 09/2020   Diabetes ([place])    Gout    Hepatitis    at age 34    Patient Active Problem List   Diagnosis Date Noted   Acute urinary retention 03/03/2021   Hematuria 03/03/2021   Esophageal varices (HCC)    PUD (peptic ulcer disease)    Duodenal stricture    Gastric outlet obstruction    Non-intractable vomiting    UTI (urinary tract infection) 02/26/2021   Dehydration 02/26/2021   Gastric dilatation 02/26/2021   GERD (gastroesophageal reflux disease) 02/26/2021   Diarrhea 02/26/2021   Hypokalemia 02/26/2021   Leukocytosis 70/35/0093   Metabolic acidosis 81/82/9937   AKI (acute kidney injury) ([place]) 02/25/2021   Diabetes mellitus ([place]) 02/25/2021    Past Surgical History:  Procedure Laterality Date   arthritis     BALLOON DILATION N/A 03/02/2021   Procedure: BALLOON DILATION;  Surgeon: Daneil Dolin, MD;  Location: [facility] ORS;  Service: Gastroenterology;  Laterality: N/A;   BIOPSY  02/27/2021   Procedure: BIOPSY;  Surgeon: Rogene Houston, MD;  Location: [facility] ENDO SUITE;  Service: Endoscopy;;   BIOPSY  03/02/2021   Procedure: BIOPSY;  Surgeon: Daneil Dolin, MD;  Location: [facility] ORS;  Service: Gastroenterology;;   CYSTOSCOPY W/ URETERAL STENT PLACEMENT     ESOPHAGOGASTRODUODENOSCOPY (EGD) WITH PROPOFOL N/A 02/27/2021   Procedure: ESOPHAGOGASTRODUODENOSCOPY (EGD) WITH PROPOFOL;  Surgeon: Rogene Houston, MD;  Location: [facility] END[facility];  Service: Endoscopy;  Laterality: N/A;   ESOPHAGOGASTRODUODENOSCOPY (EGD) WITH PROPOFOL N/A 03/02/2021   Procedure: ESOPHAGOGASTRODUODENOSCOPY (EGD) WITH PROPOFOL;  Surgeon: Daneil Dolin, MD;  Location: [facility] OR[facility];  Service: Gastroenterology;  Laterality: N/A;  to be done in OR per anesthesia   EXTRACORPOREAL SHOCK WAVE LITHOTRIPSY     EXTRACORPOREAL SHOCK WAVE LITHOTRIPSY     PROSTATE BIOPSY     TONSILLECTOMY         Family History  Problem Relation Age of Onset   Cancer Brother        patient can't remember what kind   Colon cancer Neg Hx    Celiac disease Neg Hx    Inflammatory bowel disease Neg Hx     Social History   Tobacco Use   Smoking status: Never   Smokeless tobacco: Never  Substance Use Topics   Alcohol use: Yes  Home Medications Prior to Admission medications   Medication Sig Start Date End Date Taking? Authorizing Provider  aspirin EC 81 MG tablet Take 81 mg by mouth daily. Swallow whole.    [provider]  azelastine (ASTELIN) 0.1 % nasal spray Place into both nostrils. 07/14/20   [provider]  feeding supplement, GLUCERNA SHAKE, (GLUCERNA SHAKE) LIQD Take 237 mLs by mouth 2 (two) times daily between meals. 03/04/21 04/03/21  Manuella Ghazi, Pratik D, DO  finasteride (PROSCAR) 5 MG tablet Take 1 tablet (5 mg total) by mouth daily. 10/16/20   Irine Seal, MD  JANUVIA 100 MG tablet Take 100 mg by mouth daily. 07/09/20    [provider]  latanoprost (XALATAN) 0.005 % ophthalmic solution Place 1 drop into both eyes daily. 07/03/20   [provider]  LEVEMIR FLEXTOUCH 100 UNIT/ML FlexPen SMARTSIG:20 Unit(s) SUB-Q Daily 10/06/20   [provider]  metoCLOPramide (REGLAN) 10 MG tablet Take 1 tablet (10 mg total) by mouth every 8 (eight) hours as needed for nausea. 02/17/21   Rayna Sexton, PA-C  Multiple Vitamins-Minerals (CENTRUM ADULTS PO) Take 1 tablet by mouth daily.    [provider]  nitrofurantoin, macrocrystal-monohydrate, (MACROBID) 100 MG capsule Take 1 capsule (100 mg total) by mouth 2 (two) times daily. 03/06/21   McKenzie, Candee Furbish, MD  pantoprazole (PROTONIX) 40 MG tablet Take 1 tablet (40 mg total) by mouth 2 (two) times daily. 03/04/21 04/03/21  Heath Lark D, DO    Allergies    Patient has no known allergies.  Review of Systems   Review of Systems  Constitutional:  Negative for activity change, appetite change and fever.  HENT:  Negative for congestion.   Respiratory:  Negative for cough, chest tightness and shortness of breath.   Cardiovascular:  Negative for chest pain.  Gastrointestinal:  Negative for abdominal pain, nausea and vomiting.  Genitourinary:  Positive for decreased urine volume, difficulty urinating and dysuria. Negative for flank pain, hematuria and urgency.  Musculoskeletal:  Negative for arthralgias and myalgias.  Skin:  Negative for rash.  Neurological:  Negative for dizziness, weakness and headaches.   all other systems are negative except as noted in the HPI and PMH.   Physical Exam Updated Vital Signs BP 130/78   Pulse 78   Temp 97.8 F (36.6 C) (Oral)   Resp 16   Ht 6\' 3"  (1.905 m)   Wt 104 kg   SpO2 98%   BMI 28.66 kg/m   Physical Exam Vitals and nursing note reviewed.  Constitutional:      General: He is not in acute distress.    Appearance: He is well-developed.  HENT:     Head: Normocephalic and atraumatic.      Mouth/Throat:     Pharynx: No oropharyngeal exudate.  Eyes:     Conjunctiva/sclera: Conjunctivae normal.     Pupils: Pupils are equal, round, and reactive to light.  Neck:     Comments: No meningismus. Cardiovascular:     Rate and Rhythm: Normal rate and regular rhythm.     Heart sounds: Normal heart sounds. No murmur heard. Pulmonary:     Effort: Pulmonary effort is normal. No respiratory distress.     Breath sounds: Normal breath sounds.  Abdominal:     Palpations: Abdomen is soft.     Tenderness: There is no abdominal tenderness. There is no guarding or rebound.  Genitourinary:    Comments: Foley catheter in place draining clear yellow urine. No testicular tenderness Musculoskeletal:  General: No tenderness. Normal range of motion.     Cervical back: Normal range of motion and neck supple.  Skin:    General: Skin is warm.  Neurological:     Mental Status: He is alert and oriented to person, place, and time.     Cranial Nerves: No cranial nerve deficit.     Motor: No abnormal muscle tone.     Coordination: Coordination normal.     Comments:  5/5 strength throughout. CN 2-12 intact.Equal grip strength.   Psychiatric:        Behavior: Behavior normal.    ED Results / Procedures / Treatments   Labs (all labs ordered are listed, but only abnormal results are displayed) Labs Reviewed  URINE CULTURE  URINALYSIS, ROUTINE W REFLEX MICROSCOPIC  CBC WITH DIFFERENTIAL/PLATELET  COMPREHENSIVE METABOLIC PANEL    EKG None  Radiology No results found.  Procedures Procedures   Medications Ordered in ED Medications - No data to display  ED Course  I have reviewed the triage vital signs and the nursing notes.  Pertinent labs & imaging results that were available during my care of the patient were reviewed by me and considered in my medical decision making (see chart for details).    MDM Rules/Calculators/A&P                         Urinary retention with recent  hospitalization where he had a catheter which was discontinued.  No fever or vomiting.  Bladder scan greater than 999 mL.  Foley catheter placed prior to my assessment patient feels much improved.  Labs checked given patient's recent hospitalization with AKI.  He feels improved with Foley catheter in place. Urinalysis not impressive for infection.  Culture will be sent.  He is already on Macrobid.  Creatinine is stable.  Hemoglobin is stable.  Patient comfortable on recheck.  They have urology follow-up on Monday in 2 days. Return to the ED with worsening pain, fever, vomiting, unable to urinate or any other concerns. Final Clinical Impression(s) / ED Diagnoses Final diagnoses:  Urinary retention    Rx / DC Orders ED Discharge Orders     None        Eldridge Marcott, Annie Main, MD 03/07/21 [phone]

## 2021-03-07 NOTE — Discharge Instructions (Addendum)
Keep catheter in place until follow-up with Dr. Alyson Ingles.  Return to the ED with worsening pain, fever, vomiting, unable to urinate or any other concerns.

## 2021-03-07 NOTE — ED Notes (Signed)
ED Provider at bedside. 

## 2021-03-08 LAB — URINE CULTURE
Culture: NO GROWTH
Organism ID, Bacteria: NO GROWTH

## 2021-03-09 ENCOUNTER — Encounter: Payer: Self-pay | Admitting: Urology

## 2021-03-09 ENCOUNTER — Ambulatory Visit: Payer: Medicare Other | Admitting: Urology

## 2021-03-09 ENCOUNTER — Other Ambulatory Visit: Payer: Self-pay

## 2021-03-09 VITALS — BP 95/57 | HR 89

## 2021-03-09 DIAGNOSIS — N401 Enlarged prostate with lower urinary tract symptoms: Secondary | ICD-10-CM | POA: Diagnosis not present

## 2021-03-09 DIAGNOSIS — N138 Other obstructive and reflux uropathy: Secondary | ICD-10-CM | POA: Diagnosis not present

## 2021-03-09 MED ORDER — TAMSULOSIN HCL 0.4 MG PO CAPS: 0.4000 mg | ORAL_CAPSULE | Freq: Every day | ORAL | 11 refills | Status: DC

## 2021-03-09 NOTE — Progress Notes (Signed)
Urological Symptom Review  Patient is experiencing the following symptoms: Patient has catheter   Review of Systems  Gastrointestinal (upper)  : Negative for upper GI symptoms  Gastrointestinal (lower) : Negative for lower GI symptoms  Constitutional : Negative for symptoms  Skin: Negative for skin symptoms  Eyes: Negative for eye symptoms  Ear/Nose/Throat : Negative for Ear/Nose/Throat symptoms  Hematologic/Lymphatic: Negative for Hematologic/Lymphatic symptoms  Cardiovascular : Negative for cardiovascular symptoms  Respiratory : Negative for respiratory symptoms  Endocrine: Negative for endocrine symptoms  Musculoskeletal: Negative for musculoskeletal symptoms  Neurological: Negative for neurological symptoms  Psychologic: Negative for psychiatric symptoms  

## 2021-03-09 NOTE — Progress Notes (Signed)
03/09/2021 4:17 PM   Bryan Vargas 11-16-1950 [id number]  Referring provider: Quentin Cornwall, MD [place]  No chief complaint on file.   HPI:  1. BPH with urinary obstruction  2. Urinary frequency  3. Elevated PSA  4. Bladder stones  5. Microhematuria     03/09/21: Bryan Vargas returns and underwent EGD 03/02/21. He had a foley placed for retention and then removed. He had dysuria and started nitrofurantoin 03/06/21 but urine cx was negative x 2. Ultimately the foley was replaced 03/07/21 with > 1 L in bladder. He presents late this afternoon for void trial.  He was constipated but now having BMs. On 5ari monotherapy.    01/15/21: Bryan Vargas returns today in f/u.   He has BPH with a 376ml prostate and small renal stones.  He remains on tamsulosin and was given finasteride at his last visit. He has had no side effects with the med.  He has had no hematuria.  He has a reduced stream in the AM with some intermittency.   His PSA fell from 9.9 with a 36.5% f/t ratio to 6.9 with a 34.8% f/t ratio over the last 3 months on the finasteride.  His IPSS is 4.   His UA has >30 WBC and 3-10 RBC with few bacteria.  This is stable.     10/16/20: Bryan Vargas returns today in f/u with CT results.  He has right renal atrophy with punctate stones.  He has left renal cysts.  His prostate is very large with intravesical protrusion and tiny bladder stones but no obvious mass. His prostate measures about 358ml.   He is voiding well with a reduced stream.  His IPSS is 5.  His urine culture was negative on 09/04/20.  His UA has >30 WBC and many bacteria but no only 0-2 RBC's.  He remains on tamsulosin but hasn't notice improvement with that.   Bryan Vargas is a 70 yo make with a history of stones.  He last passed a stone about 3 months ago.  He currently has no symptoms but his UA today has >30 WBC's, 11-30 RBC's and mod bacteria.  He has had some gross hematuria intermittently over the last 2 years.  He was treated with  antibiotics when he last bled 3 months ago and the bleeding clear.  He stays hydrated.  His IPSS is 5 with nocturia x .   He has had ESWL x 2 and ureteroscopy x 1.  He had an 8x98mm stone and was stented in 2015.   His prior care has been in [place].  He has no dysuria or flank pain at this time.    He is currently on tamsulosin and has been on it for a year.   He has had prior prostate biopsies with the last I find in 2008 for a PSA of 4.69.  His prostate was 65.65ml and the biopsy was negative and has had cystoscopy.   PMH: Past Medical History:  Diagnosis Date   Arthritis    Coronary artery calcification seen on CAT scan 09/2020   Diabetes ([place])    Gout    Hepatitis    at age 29    Surgical History: Past Surgical History:  Procedure Laterality Date   arthritis     BALLOON DILATION N/A 03/02/2021   Procedure: BALLOON DILATION;  Surgeon: Daneil Dolin, MD;  Location: [facility] OR[facility];  Service: Gastroenterology;  Laterality: N/A;   BIOPSY  02/27/2021   Procedure: BIOPSY;  Surgeon: Rogene Houston, MD;  Location: [facility];  Service: Endoscopy;;   BIOPSY  03/02/2021   Procedure: BIOPSY;  Surgeon: Daneil Dolin, MD;  Location: [facility];  Service: Gastroenterology;;   CYSTOSCOPY W/ URETERAL STENT PLACEMENT     ESOPHAGOGASTRODUODENOSCOPY (EGD) WITH PROPOFOL N/A 02/27/2021   Procedure: ESOPHAGOGASTRODUODENOSCOPY (EGD) WITH PROPOFOL;  Surgeon: Rogene Houston, MD;  Location[facility];  Service: Endoscopy;  Laterality: N/A;   ESOPHAGOGASTRODUODENOSCOPY (EGD) WITH PROPOFOL N/A 03/02/2021   Procedure: ESOPHAGOGASTRODUODENOSCOPY (EGD) WITH PROPOFOL;  Surgeon: Daneil Dolin, MD;  Location: [facility];  Service: Gastroenterology;  Laterality: N/A;  to be done in OR per anesthesia   EXTRACORPOREAL SHOCK WAVE LITHOTRIPSY     EXTRACORPOREAL SHOCK WAVE LITHOTRIPSY     PROSTATE BIOPSY     TONSILLECTOMY      Home Medications:  Allergies as of 03/09/2021   No Known Allergies      Medication List         Accurate as of March 09, 2021  4:17 PM. If you have any questions, ask your nurse or doctor.          aspirin EC 81 MG tablet Take 81 mg by mouth daily. Swallow whole.   azelastine 0.1 % nasal spray Commonly known as: ASTELIN Place into both nostrils.   CENTRUM ADULTS PO Take 1 tablet by mouth daily.   feeding supplement (GLUCERNA SHAKE) Liqd Take 237 mLs by mouth 2 (two) times daily between meals.   finasteride 5 MG tablet Commonly known as: PROSCAR Take 1 tablet (5 mg total) by mouth daily.   Januvia 100 MG tablet Generic drug: sitaGLIPtin Take 100 mg by mouth daily.   latanoprost 0.005 % ophthalmic solution Commonly known as: XALATAN Place 1 drop into both eyes daily.   Levemir FlexTouch 100 UNIT/ML FlexPen Generic drug: insulin detemir SMARTSIG:20 Unit(s) SUB-Q Daily   metoCLOPramide 10 MG tablet Commonly known as: REGLAN Take 1 tablet (10 mg total) by mouth every 8 (eight) hours as needed for nausea.   nitrofurantoin (macrocrystal-monohydrate) 100 MG capsule Commonly known as: MACROBID Take 1 capsule (100 mg total) by mouth 2 (two) times daily.   pantoprazole 40 MG tablet Commonly known as: Protonix Take 1 tablet (40 mg total) by mouth 2 (two) times daily.        Allergies: No Known Allergies  Family History: Family History  Problem Relation Age of Onset   Cancer Brother        patient can't remember what kind   Colon cancer Neg Hx    Celiac disease Neg Hx    Inflammatory bowel disease Neg Hx     Social History:  reports that he has never smoked. He has never used smokeless tobacco. He reports current alcohol use. No history on file for drug use.   Physical Exam: BP (!) 95/57   Pulse 89   Constitutional:  Alert and oriented, No acute distress. HEENT: [place] AT, moist mucus membranes.  Trachea midline, no masses. Cardiovascular: No clubbing, cyanosis, or edema. Respiratory: Normal respiratory effort, no increased work of breathing. GU:  No CVA tenderness; Foley in place - urine clear  Skin: No rashes, bruises or suspicious lesions. Neurologic: Grossly intact, no focal deficits, moving all 4 extremities. Psychiatric: Normal mood and affect.  Laboratory Data: Lab Results  Component Value Date   WBC 11.9 (H) 03/07/2021   HGB 12.7 (L) 03/07/2021   HCT 38.4 (L) 03/07/2021   MCV 95.8 03/07/2021  PLT 188 03/07/2021    Lab Results  Component Value Date   CREATININE 1.28 (H) 03/07/2021    No results found for: PSA  No results found for: TESTOSTERONE  Lab Results  Component Value Date   HGBA1C 5.8 (H) 02/27/2021    Urinalysis    Component Value Date/Time   COLORURINE YELLOW 03/07/2021 0625   APPEARANCEUR CLEAR 03/07/2021 0625   APPEARANCEUR Cloudy (A) 03/06/2021 1228   LABSPEC 1.013 03/07/2021 0625   PHURINE 6.0 03/07/2021 0625   GLUCOSEU NEGATIVE 03/07/2021 0625   HGBUR SMALL (A) 03/07/2021 0625   BILIRUBINUR NEGATIVE 03/07/2021 0625   BILIRUBINUR Negative 03/06/2021 1228   KETONESUR 5 (A) 03/07/2021 0625   PROTEINUR 30 (A) 03/07/2021 0625   NITRITE NEGATIVE 03/07/2021 0625   LEUKOCYTESUR NEGATIVE 03/07/2021 0625    Lab Results  Component Value Date   LABMICR See below: 03/06/2021   WBCUA >30 (A) 03/06/2021   LABEPIT 0-10 03/06/2021   MUCUS Present 01/15/2021   BACTERIA RARE (A) 03/07/2021    Pertinent Imaging: N/a No results found for this or any previous visit.  No results found for this or any previous visit.  No results found for this or any previous visit.  No results found for this or any previous visit.  Results for orders placed during the hospital encounter of 02/25/21  US RENAL  Narrative CLINICAL DATA:  Acute kidney injury, history diabetes mellitus, coronary artery disease  EXAM: RENAL / URINARY TRACT ULTRASOUND COMPLETE  COMPARISON:  CT abdomen pelvis without contrast 02/25/2021  FINDINGS: Right Kidney:  Renal measurements: 5.8 x 3.6 x 2.1 cm = volume: 23 mL.  Small and atrophic with thinned echogenic cortex. No mass or hydronephrosis.  Left Kidney:  Renal measurements: 17.6 x 7.4 x 7.5 cm = volume: 509 mL. Normal cortical thickness. Increased cortical echogenicity. Exophytic cyst at lower kidney 6.2 x 5.6 x 6.1 cm. Additional simple cyst at lower kidney 4.2 x 3.4 x 3.4 cm. Multiple additional smaller cysts are identified. Total of 11 cysts are noted. No hydronephrosis or shadowing calcification.  Bladder:  Mild bladder wall thickening.  Other:  Significantly enlarged prostate gland extending into the bladder base, prostate gland measuring 6.6 x 8.9 x 10.5 cm a calculated volume of 323 mL.  IMPRESSION: Atrophic RIGHT kidney.  Medical renal disease lytic changes LEFT kidney with multiple cysts up to 6.2 cm diameter.  Markedly enlarged prostate gland with associated bladder wall thickening which may reflect chronic outlet obstruction.   Electronically Signed By: Lavonia Dana M.D. On: 02/26/2021 17:16  No results found for this or any previous visit.  Results for orders placed during the hospital encounter of 09/30/20  CT HEMATURIA WORKUP  Narrative CLINICAL DATA:  Gross hematuria, history of renal stones.  EXAM: CT ABDOMEN AND PELVIS WITHOUT AND WITH CONTRAST  TECHNIQUE: Multidetector CT imaging of the abdomen and pelvis was performed following the standard protocol before and following the bolus administration of intravenous contrast.  CONTRAST:  161mL OMNIPAQUE IOHEXOL 300 MG/ML  SOLN  COMPARISON:  None.  FINDINGS: Lower chest: Image quality is degraded by respiratory motion. Lung bases are grossly clear. Coronary artery calcification. Heart size normal. No pericardial or pleural effusion. Distal esophagus is grossly unremarkable.  Hepatobiliary: Liver is decreased in attenuation diffusely and is enlarged, measuring 20.0 cm. Liver and gallbladder are otherwise unremarkable. No biliary ductal  dilatation.  Pancreas: 10 x 14 mm low-attenuation lesion in the tail the pancreas (7/30). Otherwise unremarkable.  Spleen: Negative.  Adrenals/Urinary Tract: Adrenal glands are unremarkable. Right kidney is severely atrophic with dilatation of the right ureter. There may be punctate stones in the right kidney. Multiple low-attenuation lesions in the left kidney measure up to 6.2 cm. Those measuring greater than 1.5 cm in size are consistent with cysts. Others are too small to definitively characterize but statistically, cysts are likely. Subcentimeter hyperdense lesion in the anterior interpolar left kidney (2/40), too small to characterize. No left renal or ureteric stones. Left ureter is decompressed. Poor excretion of contrast in the right kidney, limiting evaluation. Distal left ureter is poorly opacified, also limiting evaluation. Otherwise, no filling defects in the opacified portions of the left intrarenal collecting system and left ureter. There are peripheral calcifications in the posterolateral aspects of bladder bilaterally. Bladder is indented by a markedly enlarged prostate.  Stomach/Bowel: Stomach, small bowel, appendix and colon are unremarkable.  Vascular/Lymphatic: Atherosclerotic calcification of the aorta. There are several perirectal lymph nodes measuring up to 6 mm (7/81).  Reproductive: Prostate is markedly enlarged and indents the bladder.  Other: No free fluid. Mesenteries and peritoneum are otherwise unremarkable.  Musculoskeletal: There are scattered sclerotic lesions in the spine and pelvis, nonspecific. Degenerative changes in the spine.  IMPRESSION: 1. Markedly enlarged prostate indents the bladder. Peripheral bladder calcifications. Punctate stones in a severely atrophic right kidney. Right hydronephrosis without definitive cause identified. Distal left ureter is poorly opacified, limiting evaluation. No additional findings to explain the  patient's hematuria. 2. Numerous subcentimeter perirectal lymph nodes. Consider correlation with colonoscopy, as clinically indicated. 3. Scattered sclerotic foci in the spine and pelvis are nonspecific. Difficult to exclude metastatic disease. 4. Steatotic enlarged liver. 5. 10 x 14 mm low-attenuation pancreatic tail lesion may represent a pseudocyst if there is a history of pancreatitis. Cystic pancreatic neoplasm cannot be excluded. Follow-up CT abdomen without and with contrast in 2 years could be performed in further evaluation, as clinically indicated. This recommendation follows ACR consensus guidelines: Management of Incidental Pancreatic Cysts: A White Paper of the ACR Incidental Findings Committee. J Am Coll Radiol 5456;25:638-937. 6. Aortic atherosclerosis (ICD10-I70.0). Coronary artery calcification.   Electronically Signed By: Lorin Picket M.D. On: 10/01/2020 10:31  No results found for this or any previous visit.   Assessment & Plan:    1. BPH with urinary obstruction Cont finasteride and will add back tamsulosin.  Disc nature r/b/a to alpha blockers.   2. Urinary retention - f/u one morning for void trial    No follow-ups on file.  Festus Aloe, MD  [facility]  [address] [place], [place] [address] [phone]

## 2021-03-10 ENCOUNTER — Ambulatory Visit (INDEPENDENT_AMBULATORY_CARE_PROVIDER_SITE_OTHER): Payer: Medicare Other

## 2021-03-10 DIAGNOSIS — N401 Enlarged prostate with lower urinary tract symptoms: Secondary | ICD-10-CM

## 2021-03-10 DIAGNOSIS — N138 Other obstructive and reflux uropathy: Secondary | ICD-10-CM

## 2021-03-10 NOTE — Progress Notes (Signed)
Fill and Pull Catheter Removal  Patient is present today for a catheter removal.  Patient was cleaned and prepped in a sterile fashion 288ml of sterile water/ saline was instilled into the bladder when the patient felt the urge to urinate. 36ml of water was then drained from the balloon.  A 16FR foley cath was removed from the bladder no complications were noted .  Patient as then given some time to void on their own.  Patient can void  160ml on their own after some time.  Patient tolerated well.  Performed by: Estill Bamberg RN  Follow up/ Additional notes: 1 week NV PVR

## 2021-03-11 ENCOUNTER — Encounter (INDEPENDENT_AMBULATORY_CARE_PROVIDER_SITE_OTHER): Payer: Self-pay

## 2021-03-11 ENCOUNTER — Other Ambulatory Visit (INDEPENDENT_AMBULATORY_CARE_PROVIDER_SITE_OTHER): Payer: Self-pay

## 2021-03-11 DIAGNOSIS — K222 Esophageal obstruction: Secondary | ICD-10-CM

## 2021-03-12 ENCOUNTER — Telehealth (INDEPENDENT_AMBULATORY_CARE_PROVIDER_SITE_OTHER): Payer: Self-pay

## 2021-03-12 ENCOUNTER — Encounter (INDEPENDENT_AMBULATORY_CARE_PROVIDER_SITE_OTHER): Payer: Self-pay

## 2021-03-12 NOTE — Telephone Encounter (Signed)
LeighAnn Navi Erber, CMA  

## 2021-03-12 NOTE — Telephone Encounter (Signed)
Noted  

## 2021-03-13 ENCOUNTER — Telehealth: Payer: Self-pay

## 2021-03-13 ENCOUNTER — Telehealth: Payer: Self-pay | Admitting: Gastroenterology

## 2021-03-13 NOTE — Telephone Encounter (Signed)
-----   Message from Cleon Gustin, MD sent at 03/12/2021  1:22 PM EDT ----- negative ----- Message ----- From: Iris Pert, LPN Sent: [id number]   9:18 AM EDT To: Cleon Gustin, MD  Please review

## 2021-03-13 NOTE — Telephone Encounter (Signed)
Patient notified that urine culture was negative

## 2021-03-13 NOTE — Patient Instructions (Signed)
Bryan Vargas  03/13/2021     '@PREFPERIOPPHARMACY'$ @   Your procedure is scheduled on  03/18/2021.   Report to [facility] at  1120  A.M.   Call this number if you have problems the morning of surgery:  [phone]   Remember:  Follow the diet instructions given to you by the office.  Take 10 units of Levemir the night before your procedure.  DO NOT take any medications for diabetes the morning of your procedure.     Take these medicines the morning of surgery with A SIP OF WATER                   proscar, protonix.     Do not wear jewelry, make-up or nail polish.  Do not wear lotions, powders, or perfumes, or deodorant.  Do not shave 48 hours prior to surgery.  Men may shave face and neck.  Do not bring valuables to the hospital.  [facility] is not responsible for any belongings or valuables.  Contacts, dentures or bridgework may not be worn into surgery.  Leave your suitcase in the car.  After surgery it may be brought to your room.  For patients admitted to the hospital, discharge time will be determined by your treatment team.  Patients discharged the day of surgery will not be allowed to drive home and must have someone with them for 24 hours.    Special instructions:    DO NOT smoke tobacco or vape for 24 hours before your procedure.  Please read over the following fact sheets that you were given. Anesthesia Post-op Instructions and Care and Recovery After Surgery      Upper Endoscopy, Adult, Care After This sheet gives you information about how to care for yourself after your procedure. Your health care provider may also give you more specific instructions. If you have problems or questions, contact your health careprovider. What can I expect after the procedure? After the procedure, it is common to have: A sore throat. Mild stomach pain or discomfort. Bloating. Nausea. Follow these instructions at home:  Follow instructions from your health  care provider about what to eat or drink after your procedure. Return to your normal activities as told by your health care provider. Ask your health care provider what activities are safe for you. Take over-the-counter and prescription medicines only as told by your health care provider. If you were given a sedative during the procedure, it can affect you for several hours. Do not drive or operate machinery until your health care provider says that it is safe. Keep all follow-up visits as told by your health care provider. This is important. Contact a health care provider if you have: A sore throat that lasts longer than one day. Trouble swallowing. Get help right away if: You vomit blood or your vomit looks like coffee grounds. You have: A fever. Bloody, black, or tarry stools. A severe sore throat or you cannot swallow. Difficulty breathing. Severe pain in your chest or abdomen. Summary After the procedure, it is common to have a sore throat, mild stomach discomfort, bloating, and nausea. If you were given a sedative during the procedure, it can affect you for several hours. Do not drive or operate machinery until your health care provider says that it is safe. Follow instructions from your health care provider about what to eat or drink after your procedure. Return to your normal activities as told by  your health care provider. This information is not intended to replace advice given to you by your health care provider. Make sure you discuss any questions you have with your healthcare provider. Document Revised: 08/07/2019 Document Reviewed: 01/09/2018 Elsevier Patient Education  2022 [place]. https://www.asge.org/home/for-patients/patient-information/understanding-eso-dilation-updated">  Esophageal Dilatation Esophageal dilatation, also called esophageal dilation, is a procedure to widen or open a blocked or narrowed part of the esophagus. The esophagus is the part of the body  that moves food and liquid from the mouth to the stomach. You may need this procedure if: You have a buildup of scar tissue in your esophagus that makes it difficult, painful, or impossible to swallow. This can be caused by gastroesophageal reflux disease (GERD). You have cancer of the esophagus. There is a problem with how food moves through your esophagus. In some cases, you may need this procedure repeated at a later time to dilatethe esophagus gradually. Tell a health care provider about: Any allergies you have. All medicines you are taking, including vitamins, herbs, eye drops, creams, and over-the-counter medicines. Any problems you or family members have had with anesthetic medicines. Any blood disorders you have. Any surgeries you have had. Any medical conditions you have. Any antibiotic medicines you are required to take before dental procedures. Whether you are pregnant or may be pregnant. What are the risks? Generally, this is a safe procedure. However, problems may occur, including: Bleeding due to a tear in the lining of the esophagus. A hole, or perforation, in the esophagus. What happens before the procedure? Ask your health care provider about: Changing or stopping your regular medicines. This is especially important if you are taking diabetes medicines or blood thinners. Taking medicines such as aspirin and ibuprofen. These medicines can thin your blood. Do not take these medicines unless your health care provider tells you to take them. Taking over-the-counter medicines, vitamins, herbs, and supplements. Follow instructions from your health care provider about eating or drinking restrictions. Plan to have a responsible adult take you home from the hospital or clinic. Plan to have a responsible adult care for you for the time you are told after you leave the hospital or clinic. This is important. What happens during the procedure? You may be given a medicine to help you  relax (sedative). A numbing medicine may be sprayed into the back of your throat, or you may gargle the medicine. Your health care provider may perform the dilatation using various surgical instruments, such as: Simple dilators. This instrument is carefully placed in the esophagus to stretch it. Guided wire bougies. This involves using an endoscope to insert a wire into the esophagus. A dilator is passed over this wire to enlarge the esophagus. Then the wire is removed. Balloon dilators. An endoscope with a small balloon is inserted into the esophagus. The balloon is inflated to stretch the esophagus and open it up. The procedure may vary among health care providers and hospitals. What can I expect after the procedure? Your blood pressure, heart rate, breathing rate, and blood oxygen level will be monitored until you leave the hospital or clinic. Your throat may feel slightly sore and numb. This will get better over time. You will not be allowed to eat or drink until your throat is no longer numb. When you are able to drink, urinate, and sit on the edge of the bed without nausea or dizziness, you may be able to return home. Follow these instructions at home: Take over-the-counter and prescription medicines only as  told by your health care provider. If you were given a sedative during the procedure, it can affect you for several hours. Do not drive or operate machinery until your health care provider says that it is safe. Plan to have a responsible adult care for you for the time you are told. This is important. Follow instructions from your health care provider about any eating or drinking restrictions. Do not use any products that contain nicotine or tobacco, such as cigarettes, e-cigarettes, and chewing tobacco. If you need help quitting, ask your health care provider. Keep all follow-up visits. This is important. Contact a health care provider if: You have a fever. You have pain that is not  relieved by medicine. Get help right away if: You have chest pain. You have trouble breathing. You have trouble swallowing. You vomit blood. You have black, tarry, or bloody stools. These symptoms may represent a serious problem that is an emergency. Do not wait to see if the symptoms will go away. Get medical help right away. Call your local emergency services (911 in the [place] Do not drive yourself to the hospital. Summary Esophageal dilatation, also called esophageal dilation, is a procedure to widen or open a blocked or narrowed part of the esophagus. Plan to have a responsible adult take you home from the hospital or clinic. For this procedure, a numbing medicine may be sprayed into the back of your throat, or you may gargle the medicine. Do not drive or operate machinery until your health care provider says that it is safe. This information is not intended to replace advice given to you by your health care provider. Make sure you discuss any questions you have with your healthcare provider. Document Revised: 12/26/2019 Document Reviewed: 12/26/2019 Elsevier Patient Education  [place] After This sheet gives you information about how to care for yourself after your procedure. Your health care provider may also give you more specific instructions. If you have problems or questions, contact your health careprovider. What can I expect after the procedure? After the procedure, it is common to have: Tiredness. Forgetfulness about what happened after the procedure. Impaired judgment for important decisions. Nausea or vomiting. Some difficulty with balance. Follow these instructions at home: For the time period you were told by your health care provider:     Rest as needed. Do not participate in activities where you could fall or become injured. Do not drive or use machinery. Do not drink alcohol. Do not take sleeping pills or medicines that  cause drowsiness. Do not make important decisions or sign legal documents. Do not take care of children on your own. Eating and drinking Follow the diet that is recommended by your health care provider. Drink enough fluid to keep your urine pale yellow. If you vomit: Drink water, juice, or soup when you can drink without vomiting. Make sure you have little or no nausea before eating solid foods. General instructions Have a responsible adult stay with you for the time you are told. It is important to have someone help care for you until you are awake and alert. Take over-the-counter and prescription medicines only as told by your health care provider. If you have sleep apnea, surgery and certain medicines can increase your risk for breathing problems. Follow instructions from your health care provider about wearing your sleep device: Anytime you are sleeping, including during daytime naps. While taking prescription pain medicines, sleeping medicines, or medicines that make you drowsy. Avoid  smoking. Keep all follow-up visits as told by your health care provider. This is important. Contact a health care provider if: You keep feeling nauseous or you keep vomiting. You feel light-headed. You are still sleepy or having trouble with balance after 24 hours. You develop a rash. You have a fever. You have redness or swelling around the IV site. Get help right away if: You have trouble breathing. You have new-onset confusion at home. Summary For several hours after your procedure, you may feel tired. You may also be forgetful and have poor judgment. Have a responsible adult stay with you for the time you are told. It is important to have someone help care for you until you are awake and alert. Rest as told. Do not drive or operate machinery. Do not drink alcohol or take sleeping pills. Get help right away if you have trouble breathing, or if you suddenly become confused. This information is not  intended to replace advice given to you by your health care provider. Make sure you discuss any questions you have with your healthcare provider. Document Revised: 04/24/2020 Document Reviewed: 07/12/2019 Elsevier Patient Education  2022 [employer].

## 2021-03-13 NOTE — Telephone Encounter (Signed)
Spoke with patient's wife today.  Patient is scheduled for repeat EGD with Dr. Laural Golden on 7/27 following recent hospitalization with duodenal stricture.  Patient already had an appointment in August with [facility].  We had previously discussed whether he wanted to keep care with our office versus establishing with [facility].  After discussion today, they prefer to follow-up with [facility] in August for their ongoing care as patient's wife also goes to [facility], and they already have an appointment scheduled.

## 2021-03-16 ENCOUNTER — Other Ambulatory Visit: Payer: Self-pay

## 2021-03-16 ENCOUNTER — Encounter (HOSPITAL_COMMUNITY)
Admission: RE | Admit: 2021-03-16 | Discharge: 2021-03-16 | Disposition: A | Discharge status: Home / Self Care | Payer: Medicare Other | Source: Ambulatory Visit | Attending: Internal Medicine | Admitting: Internal Medicine

## 2021-03-16 ENCOUNTER — Encounter (HOSPITAL_COMMUNITY): Payer: Self-pay

## 2021-03-16 NOTE — Telephone Encounter (Signed)
See last note

## 2021-03-18 ENCOUNTER — Encounter (HOSPITAL_COMMUNITY): Payer: Self-pay | Admitting: Internal Medicine

## 2021-03-18 ENCOUNTER — Ambulatory Visit (HOSPITAL_COMMUNITY)
Admission: RE | Admit: 2021-03-18 | Discharge: 2021-03-18 | Disposition: A | Discharge status: Home / Self Care | Payer: Medicare Other | Attending: Internal Medicine | Admitting: Internal Medicine

## 2021-03-18 ENCOUNTER — Other Ambulatory Visit: Payer: Self-pay

## 2021-03-18 ENCOUNTER — Ambulatory Visit (HOSPITAL_COMMUNITY): Payer: Medicare Other | Admitting: Anesthesiology

## 2021-03-18 ENCOUNTER — Encounter (HOSPITAL_COMMUNITY)
Admission: RE | Disposition: A | Discharge status: Home / Self Care | Payer: Self-pay | Source: Home / Self Care | Attending: Internal Medicine

## 2021-03-18 ENCOUNTER — Telehealth: Payer: Self-pay | Admitting: Gastroenterology

## 2021-03-18 DIAGNOSIS — K315 Obstruction of duodenum: Secondary | ICD-10-CM | POA: Insufficient documentation

## 2021-03-18 DIAGNOSIS — K222 Esophageal obstruction: Secondary | ICD-10-CM

## 2021-03-18 DIAGNOSIS — Z794 Long term (current) use of insulin: Secondary | ICD-10-CM | POA: Diagnosis not present

## 2021-03-18 DIAGNOSIS — E119 Type 2 diabetes mellitus without complications: Secondary | ICD-10-CM | POA: Diagnosis not present

## 2021-03-18 DIAGNOSIS — Z7984 Long term (current) use of oral hypoglycemic drugs: Secondary | ICD-10-CM | POA: Insufficient documentation

## 2021-03-18 DIAGNOSIS — Z7982 Long term (current) use of aspirin: Secondary | ICD-10-CM | POA: Diagnosis not present

## 2021-03-18 DIAGNOSIS — K2980 Duodenitis without bleeding: Secondary | ICD-10-CM | POA: Diagnosis not present

## 2021-03-18 DIAGNOSIS — I358 Other nonrheumatic aortic valve disorders: Secondary | ICD-10-CM | POA: Diagnosis not present

## 2021-03-18 DIAGNOSIS — Z79899 Other long term (current) drug therapy: Secondary | ICD-10-CM | POA: Diagnosis not present

## 2021-03-18 HISTORY — PX: ESOPHAGOGASTRODUODENOSCOPY (EGD) WITH PROPOFOL: SHX5813

## 2021-03-18 HISTORY — PX: ESOPHAGEAL DILATION: SHX303

## 2021-03-18 LAB — GLUCOSE, CAPILLARY: Glucose-Capillary: 100 mg/dL — ABNORMAL HIGH (ref 70–99)

## 2021-03-18 SURGERY — ESOPHAGOGASTRODUODENOSCOPY (EGD) WITH PROPOFOL
Anesthesia: General

## 2021-03-18 MED ORDER — PROPOFOL 10 MG/ML IV BOLUS
INTRAVENOUS | Status: DC | PRN
  Administered 2021-03-18: 50 mg via INTRAVENOUS
  Administered 2021-03-18: 100 mg via INTRAVENOUS
  Administered 2021-03-18: 50 mg via INTRAVENOUS

## 2021-03-18 MED ORDER — PHENYLEPHRINE 40 MCG/ML (10ML) SYRINGE FOR IV PUSH (FOR BLOOD PRESSURE SUPPORT)
PREFILLED_SYRINGE | INTRAVENOUS | Status: DC | PRN
  Administered 2021-03-18 (×3): 80 ug via INTRAVENOUS

## 2021-03-18 MED ORDER — LIDOCAINE HCL (CARDIAC) PF 100 MG/5ML IV SOSY
PREFILLED_SYRINGE | INTRAVENOUS | Status: DC | PRN
  Administered 2021-03-18: 50 mg via INTRAVENOUS

## 2021-03-18 MED ORDER — PHENYLEPHRINE 40 MCG/ML (10ML) SYRINGE FOR IV PUSH (FOR BLOOD PRESSURE SUPPORT)
PREFILLED_SYRINGE | INTRAVENOUS | Status: AC
  Filled 2021-03-18: qty 10

## 2021-03-18 MED ORDER — PROPOFOL 500 MG/50ML IV EMUL
INTRAVENOUS | Status: DC | PRN
  Administered 2021-03-18: 150 ug/kg/min via INTRAVENOUS

## 2021-03-18 MED ORDER — LACTATED RINGERS IV SOLN: INTRAVENOUS | Status: DC | PRN

## 2021-03-18 NOTE — Anesthesia Preprocedure Evaluation (Addendum)
Anesthesia Evaluation  Patient identified by MRN, date of birth, ID band Patient awake    Reviewed: Allergy & Precautions, NPO status , Patient's Chart, lab work & pertinent test results  Airway Mallampati: I  TM Distance: >3 FB Neck ROM: Full  Mouth opening: Limited Mouth Opening  Dental  (+) Dental Advisory Given, Teeth Intact   Pulmonary neg pulmonary ROS,    Pulmonary exam normal breath sounds clear to auscultation       Cardiovascular Exercise Tolerance: Good + CAD  Normal cardiovascular exam Rhythm:Regular Rate:Normal  1. Left ventricular ejection fraction, by estimation, is 55 to 60%. The  left ventricle has normal function. The left ventricle has no regional  wall motion abnormalities. There is mild left ventricular hypertrophy.  Left ventricular diastolic parameters  were normal.  2. Right ventricular systolic function is normal. The right ventricular  size is normal. There is normal pulmonary artery systolic pressure.  3. The mitral valve is normal in structure. Trivial mitral valve  regurgitation.  4. The aortic valve is tricuspid. Aortic valve regurgitation is not  visualized. Mild aortic valve sclerosis is present, with no evidence of  aortic valve stenosis.    Neuro/Psych negative neurological ROS  negative psych ROS   GI/Hepatic GERD (nausea, vomitings)  Poorly Controlled,(+) Hepatitis -  Endo/Other  diabetes, Well Controlled, Type 2, Oral Hypoglycemic Agents  Renal/GU Renal InsufficiencyRenal disease (AKI, right atrophic kidney)     Musculoskeletal  (+) Arthritis ,   Abdominal   Peds  Hematology negative hematology ROS (+)   Anesthesia Other Findings 1. Left ventricular ejection fraction, by estimation, is 55 to 60%. The  left ventricle has normal function. The left ventricle has no regional  wall motion abnormalities. There is mild left ventricular hypertrophy.  Left ventricular diastolic  parameters  were normal.  2. Right ventricular systolic function is normal. The right ventricular  size is normal. There is normal pulmonary artery systolic pressure.  3. The mitral valve is normal in structure. Trivial mitral valve  regurgitation.  4. The aortic valve is tricuspid. Aortic valve regurgitation is not  visualized. Mild aortic valve sclerosis is present, with no evidence of  aortic valve stenosis.   Reproductive/Obstetrics                             Anesthesia Physical  Anesthesia Plan  ASA: 3  Anesthesia Plan: General   Post-op Pain Management:    Induction: Intravenous  PONV Risk Score and Plan: Propofol infusion  Airway Management Planned:   Additional Equipment:   Intra-op Plan:   Post-operative Plan: Extubation in OR and Possible Post-op intubation/ventilation  Informed Consent: I have reviewed the patients History and Physical, chart, labs and discussed the procedure including the risks, benefits and alternatives for the proposed anesthesia with the patient or authorized representative who has indicated his/her understanding and acceptance.       Plan Discussed with: CRNA and Surgeon  Anesthesia Plan Comments:        Anesthesia Quick Evaluation

## 2021-03-18 NOTE — Telephone Encounter (Signed)
EGD scheduled, pt instructed and medications reviewed.  Patient instructions mailed to home.  Patient to call with any questions or concerns. CT scan instructions also given to the pt, he will arrive at 2 pm to drink the contrast since he lives an hour away.

## 2021-03-18 NOTE — Anesthesia Procedure Notes (Addendum)
Date/Time: 03/18/2021 12:42 PM Performed by: Orlie Dakin, CRNA Pre-anesthesia Checklist: Patient identified, Emergency Drugs available, Suction available and Patient being monitored Patient Re-evaluated:Patient Re-evaluated prior to induction Oxygen Delivery Method: Nasal cannula Induction Type: IV induction Placement Confirmation: positive ETCO2

## 2021-03-18 NOTE — Transfer of Care (Signed)
Immediate Anesthesia Transfer of Care Note  Patient: Bryan Vargas  Procedure(s) Performed: ESOPHAGOGASTRODUODENOSCOPY (EGD) WITH PROPOFOL ESOPHAGEAL DILATION  Patient Location: Short Stay  Anesthesia Type:General  Level of Consciousness: awake, alert  and oriented  Airway & Oxygen Therapy: Patient Spontanous Breathing  Post-op Assessment: Report given to RN, Post -op Vital signs reviewed and stable and Patient moving all extremities X 4  Post vital signs: Reviewed and stable  Last Vitals:  Vitals Value Taken Time  BP    Temp    Pulse    Resp    SpO2      Last Pain:  Vitals:   03/18/21 1240  TempSrc:   PainSc: 0-No pain      Patients Stated Pain Goal: 7 ([id number])  Complications: No notable events documented.

## 2021-03-18 NOTE — Telephone Encounter (Signed)
Thanks for update Patty. GM  FYI NR

## 2021-03-18 NOTE — H&P (Signed)
Bryan Vargas is an 70 y.o. male.   Chief Complaint: Patient is here for esophagogastroduodenoscopy and duodenal stricture dilation HPI: Patient is 70 year old Caucasian male who was hospitalized about 3 weeks ago with gastric outlet obstruction.  He was found to have gastric ulcers as well as duodenitis with duodenal stricture.  The stricture could not be overcome with 7 mm scope.  He underwent repeat EGD with duodenal stricture dilation by Dr. Gala Vargas under fluoroscopic control.  The stricture was dilated to 10 mm.  Biopsy from stricture revealed benign etiology. Patient has been maintained on low residue diet.  He says he has had postprandial fullness at times and heartburn at night but he has not had nausea or vomiting.  He denies abdominal pain melena or rectal bleeding.  He is hoping that he could go back to his usual diet. He has been off aspirin for 2 days.  Past Medical History:  Diagnosis Date   Arthritis    Coronary artery calcification seen on CAT scan 09/2020   Diabetes ([place])    Gout    Hepatitis    at age 70    Past Surgical History:  Procedure Laterality Date   arthritis     BALLOON DILATION N/A 03/02/2021   Procedure: BALLOON DILATION;  Surgeon: Daneil Dolin, MD;  Location: [facility];  Service: Gastroenterology;  Laterality: N/A;   BIOPSY  02/27/2021   Procedure: BIOPSY;  Surgeon: Bryan Houston, MD;  Location: [facility];  Service: Endoscopy;;   BIOPSY  03/02/2021   Procedure: BIOPSY;  Surgeon: Daneil Dolin, MD;  Location: [facility];  Service: Gastroenterology;;   CYSTOSCOPY W/ URETERAL STENT PLACEMENT     ESOPHAGOGASTRODUODENOSCOPY (EGD) WITH PROPOFOL N/A 02/27/2021   Procedure: ESOPHAGOGASTRODUODENOSCOPY (EGD) WITH PROPOFOL;  Surgeon: Bryan Houston, MD;  Location: [facility];  Service: Endoscopy;  Laterality: N/A;   ESOPHAGOGASTRODUODENOSCOPY (EGD) WITH PROPOFOL N/A 03/02/2021   Procedure: ESOPHAGOGASTRODUODENOSCOPY (EGD) WITH PROPOFOL;  Surgeon: Daneil Dolin, MD;   Location: [facility];  Service: Gastroenterology;  Laterality: N/A;  to be done in OR per anesthesia   EXTRACORPOREAL SHOCK WAVE LITHOTRIPSY     EXTRACORPOREAL SHOCK WAVE LITHOTRIPSY     PROSTATE BIOPSY     TONSILLECTOMY      Family History  Problem Relation Age of Onset   Cancer Brother        patient can't remember what kind   Colon cancer Neg Hx    Celiac disease Neg Hx    Inflammatory bowel disease Neg Hx    Social History:  reports that he has never smoked. He has never used smokeless tobacco. He reports previous alcohol use. He reports that he does not use drugs.  Allergies: No Known Allergies  Medications Prior to Admission  Medication Sig Dispense Refill   aspirin EC 81 MG tablet Take 81 mg by mouth daily. Swallow whole.     azelastine (ASTELIN) 0.1 % nasal spray Place 1 spray into both nostrils 2 (two) times daily as needed for rhinitis or allergies.     finasteride (PROSCAR) 5 MG tablet Take 1 tablet (5 mg total) by mouth daily. 90 tablet 3   JANUVIA 100 MG tablet Take 100 mg by mouth daily.     latanoprost (XALATAN) 0.005 % ophthalmic solution Place 1 drop into both eyes at bedtime.     LEVEMIR FLEXTOUCH 100 UNIT/ML FlexPen Inject 20 Units into the skin at bedtime.     Multiple Vitamins-Minerals (CENTRUM ADULTS PO) Take  1 tablet by mouth daily.     pantoprazole (PROTONIX) 40 MG tablet Take 1 tablet (40 mg total) by mouth 2 (two) times daily. 60 tablet 2   tamsulosin (FLOMAX) 0.4 MG CAPS capsule Take 1 capsule (0.4 mg total) by mouth daily after supper. 30 capsule 11   feeding supplement, GLUCERNA SHAKE, (GLUCERNA SHAKE) LIQD Take 237 mLs by mouth 2 (two) times daily between meals. (Patient not taking: No sig reported) 14220 mL 0   metoCLOPramide (REGLAN) 10 MG tablet Take 1 tablet (10 mg total) by mouth every 8 (eight) hours as needed for nausea. (Patient not taking: No sig reported) 12 tablet 0   nitrofurantoin, macrocrystal-monohydrate, (MACROBID) 100 MG capsule Take 1  capsule (100 mg total) by mouth 2 (two) times daily. (Patient not taking: No sig reported) 14 capsule 0    Results for orders placed or performed during the hospital encounter of 03/18/21 (from the past 48 hour(s))  Glucose, capillary     Status: Abnormal   Collection Time: 03/18/21 11:18 AM  Result Value Ref Range   Glucose-Capillary 100 (H) 70 - 99 mg/dL    Comment: Glucose reference range applies only to samples taken after fasting for at least 8 hours.   No results found.  Review of Systems  Blood pressure 118/74, temperature (!) 97.5 F (36.4 C), temperature source Oral, resp. rate 19, weight 100.7 kg, SpO2 97 %. Physical Exam HENT:     Mouth/Throat:     Mouth: Mucous membranes are moist.     Pharynx: Oropharynx is clear.  Eyes:     General: No scleral icterus.    Conjunctiva/sclera: Conjunctivae normal.  Cardiovascular:     Rate and Rhythm: Normal rate and regular rhythm.     Heart sounds: Normal heart sounds. No murmur heard. Pulmonary:     Effort: Pulmonary effort is normal.     Breath sounds: Normal breath sounds.  Abdominal:     General: There is no distension.     Palpations: Abdomen is soft. There is no mass.     Tenderness: There is no abdominal tenderness.  Musculoskeletal:        General: No swelling.     Cervical back: Neck supple.  Lymphadenopathy:     Cervical: No cervical adenopathy.  Skin:    General: Skin is warm and dry.  Neurological:     Mental Status: He is alert.     Assessment/Plan  Duodenal stricture. Esophagogastroduodenoscopy with duodenal stricture dilation.  Bryan Laser, MD 03/18/2021, 12:32 PM

## 2021-03-18 NOTE — Telephone Encounter (Signed)
EGD scheduled for 03/23/21 at 12 pm at [facility] with GM  CT scheduled for tomorrow 7/28 at [facility] at 430 pm to arrive at 415 pm.  He will need to be NPO after midnight and pick up contrast.    Left message on machine to call back

## 2021-03-18 NOTE — Discharge Instructions (Signed)
Resume usual medications. Diet should be limited liquids and Ensure or Glucerna. No driving for 24 hours. If you experience nausea and vomiting please report to emergency room.

## 2021-03-18 NOTE — Telephone Encounter (Signed)
Case discussed with Dr. Laural Golden. Patient with recurrent duodenal stricture of unclear etiology. Repeat attempt at EGD and dilation unsuccessful today. I think he will benefit from repeat EGD with fluoroscopy and MAC anesthesia.  Patty, Please reach out to the patient/patient's family today or tomorrow. Please schedule this patient for an EGD with fluoroscopy and dilation (1 hour Case) for next Monday as I now have an opening in the schedule. Please have the patient undergo a CT abdomen with oral and IV contrast ASAP (before Monday) but if that cannot happen then please get an upper GI series. If neither of these can get scheduled I will still proceed with attempt at EGD with dilation but would be ideal to have 1 of these imaging studies before.  Please update Dr. Laural Golden and I when the patient is scheduled. Thanks.  GM

## 2021-03-18 NOTE — Anesthesia Postprocedure Evaluation (Signed)
Anesthesia Post Note  Patient: Bryan Vargas  Procedure(s) Performed: ESOPHAGOGASTRODUODENOSCOPY (EGD) WITH PROPOFOL ESOPHAGEAL DILATION  Patient location during evaluation: Phase II Anesthesia Type: General Level of consciousness: awake Pain management: pain level controlled Vital Signs Assessment: post-procedure vital signs reviewed and stable Respiratory status: spontaneous breathing and respiratory function stable Cardiovascular status: blood pressure returned to baseline and stable Postop Assessment: no headache and no apparent nausea or vomiting Anesthetic complications: no Comments: Late entry   No notable events documented.   Last Vitals:  Vitals:   03/18/21 1125 03/18/21 1320  BP: 118/74 95/61  Resp: 19 16  Temp: (!) 36.4 C 36.8 C  SpO2: 97% 96%    Last Pain:  Vitals:   03/18/21 1320  TempSrc: Oral  PainSc: 0-No pain                 Louann Sjogren

## 2021-03-18 NOTE — Op Note (Signed)
[facility] Patient Name: Bryan Vargas Procedure Date: 03/18/2021 12:13 PM MRN: [id number] Date of Birth: April 25, 1951 Attending MD: Hildred Laser , MD CSN: [id number] Age: 70 Admit Type: Outpatient Procedure:                Upper GI endoscopy Indications:              Stenosis of the duodenum, Follow-up of duodenal                            stenosis, For therapy of duodenal stenosis Providers:                Hildred Laser, MD, Gwenlyn Fudge, RN, Randa Spike, Technician Referring MD:             Darlina Sicilian, MD Medicines:                Propofol per Anesthesia Complications:            No immediate complications. Estimated Blood Loss:     Estimated blood loss was minimal. Procedure:                Pre-Anesthesia Assessment:                           - Prior to the procedure, a History and Physical                            was performed, and patient medications and                            allergies were reviewed. The patient's tolerance of                            previous anesthesia was also reviewed. The risks                            and benefits of the procedure and the sedation                            options and risks were discussed with the patient.                            All questions were answered, and informed consent                            was obtained. Prior Anticoagulants: The patient has                            taken no previous anticoagulant or antiplatelet                            agents except for aspirin. ASA Grade Assessment:  III - A patient with severe systemic disease. After                            reviewing the risks and benefits, the patient was                            deemed in satisfactory condition to undergo the                            procedure.                           After obtaining informed consent, the endoscope was                            passed under  direct vision. Throughout the                            procedure, the patient's blood pressure, pulse, and                            oxygen saturations were monitored continuously. The                            GIF-H190 [id number] scope was introduced through the                            mouth, and advanced to the duodenal bulb. The upper                            GI endoscopy was accomplished without difficulty.                            The patient tolerated the procedure well. Scope In: 12:47:11 PM Scope Out: 1:12:11 PM Total Procedure Duration: 0 hours 25 minutes 0 seconds  Findings:      The nasopharynx was normal.      The examined esophagus was normal. His Z-line was irregular      Bilious fluid and food debris was found in the gastric fundus and in the       gastric body. Fluid aspiration was performed through the scope suction       channel. Therapeutic scope was used for this purpose. The amount of       fluid and food debris collected was 2200 ml.      The exam of the stomach was otherwise normal. Previously noted gastric       body and antral ulcers had healed.      Patchy moderate inflammation was found in the duodenal bulb.      An acquired benign-appearing, intrinsic severe stenosis was found in the       duodenal bulb and was non-traversed. Impression:               - Normal nasopharynx.                           - Normal esophagus.                           -  Bilious fluid and food debris suctioned out of                            stomach using therapeutic scope. More than 2 L of                            gastric contents removed.                           - Duodenitis.                           - Acquired duodenal stenosis. Stricture was                            estimated to be no more than 4 mm in diam eater and                            did not attempt blind passage of balloon dilator. Moderate Sedation:      Per Anesthesia Care Recommendation:            - Patient has a contact number available for                            emergencies. The signs and symptoms of potential                            delayed complications were discussed with the                            patient. Return to normal activities tomorrow.                            Written discharge instructions were provided to the                            patient.                           - Full liquid diet today.                           - Continue present medications.                           - Referral to Dr. Rush Landmark for stricture dilation                            under fluoroscopy possible endoscopic ultrasound                            and axios stent placement if feasible. His office                            will contact the patient.                           -  Patient advised to report to the emergency room                            if he experiences nausea and vomiting. Procedure Code(s):        --- Professional ---                           [phone], Esophagogastroduodenoscopy, flexible,                            transoral; diagnostic, including collection of                            specimen(s) by brushing or washing, when performed                            (separate procedure) Diagnosis Code(s):        --- Professional ---                           K29.80, Duodenitis without bleeding                           K31.5, Obstruction of duodenum CPT copyright 2019 [employer]. All rights reserved. The codes documented in this report are preliminary and upon coder review may  be revised to meet current compliance requirements. Hildred Laser, MD Hildred Laser, MD 03/18/2021 1:42:09 PM This report has been signed electronically. Number of Addenda: 0

## 2021-03-19 ENCOUNTER — Ambulatory Visit (HOSPITAL_COMMUNITY)
Admission: RE | Admit: 2021-03-19 | Discharge: 2021-03-19 | Disposition: A | Discharge status: Home / Self Care | Payer: Medicare Other | Source: Ambulatory Visit | Attending: Gastroenterology | Admitting: Gastroenterology

## 2021-03-19 DIAGNOSIS — I700 Atherosclerosis of aorta: Secondary | ICD-10-CM | POA: Insufficient documentation

## 2021-03-19 DIAGNOSIS — N261 Atrophy of kidney (terminal): Secondary | ICD-10-CM | POA: Diagnosis not present

## 2021-03-19 DIAGNOSIS — K760 Fatty (change of) liver, not elsewhere classified: Secondary | ICD-10-CM | POA: Insufficient documentation

## 2021-03-19 DIAGNOSIS — K222 Esophageal obstruction: Secondary | ICD-10-CM | POA: Insufficient documentation

## 2021-03-19 MED ORDER — IOHEXOL 350 MG/ML SOLN
100.0000 mL | Freq: Once | INTRAVENOUS | Status: AC | PRN
  Administered 2021-03-19: 80 mL via INTRAVENOUS

## 2021-03-20 ENCOUNTER — Ambulatory Visit: Payer: Medicare Other

## 2021-03-23 ENCOUNTER — Other Ambulatory Visit: Payer: Self-pay

## 2021-03-23 ENCOUNTER — Ambulatory Visit (HOSPITAL_COMMUNITY): Payer: Medicare Other | Admitting: Certified Registered"

## 2021-03-23 ENCOUNTER — Ambulatory Visit (HOSPITAL_COMMUNITY)
Admission: RE | Admit: 2021-03-23 | Discharge: 2021-03-23 | Disposition: A | Discharge status: Home / Self Care | Payer: Medicare Other | Attending: Gastroenterology | Admitting: Gastroenterology

## 2021-03-23 ENCOUNTER — Telehealth: Payer: Self-pay

## 2021-03-23 ENCOUNTER — Ambulatory Visit (HOSPITAL_COMMUNITY): Payer: Medicare Other

## 2021-03-23 ENCOUNTER — Encounter (HOSPITAL_COMMUNITY)
Admission: RE | Disposition: A | Discharge status: Home / Self Care | Payer: Self-pay | Source: Home / Self Care | Attending: Gastroenterology

## 2021-03-23 ENCOUNTER — Encounter (HOSPITAL_COMMUNITY): Payer: Self-pay | Admitting: Gastroenterology

## 2021-03-23 DIAGNOSIS — T183XXA Foreign body in small intestine, initial encounter: Secondary | ICD-10-CM

## 2021-03-23 DIAGNOSIS — E119 Type 2 diabetes mellitus without complications: Secondary | ICD-10-CM | POA: Insufficient documentation

## 2021-03-23 DIAGNOSIS — K222 Esophageal obstruction: Secondary | ICD-10-CM

## 2021-03-23 DIAGNOSIS — K315 Obstruction of duodenum: Secondary | ICD-10-CM

## 2021-03-23 DIAGNOSIS — K269 Duodenal ulcer, unspecified as acute or chronic, without hemorrhage or perforation: Secondary | ICD-10-CM | POA: Insufficient documentation

## 2021-03-23 DIAGNOSIS — K2980 Duodenitis without bleeding: Secondary | ICD-10-CM | POA: Diagnosis not present

## 2021-03-23 DIAGNOSIS — K3189 Other diseases of stomach and duodenum: Secondary | ICD-10-CM | POA: Insufficient documentation

## 2021-03-23 DIAGNOSIS — Z809 Family history of malignant neoplasm, unspecified: Secondary | ICD-10-CM | POA: Diagnosis not present

## 2021-03-23 DIAGNOSIS — I2510 Atherosclerotic heart disease of native coronary artery without angina pectoris: Secondary | ICD-10-CM | POA: Diagnosis not present

## 2021-03-23 DIAGNOSIS — K2289 Other specified disease of esophagus: Secondary | ICD-10-CM | POA: Insufficient documentation

## 2021-03-23 DIAGNOSIS — Z8619 Personal history of other infectious and parasitic diseases: Secondary | ICD-10-CM | POA: Diagnosis not present

## 2021-03-23 DIAGNOSIS — R1010 Upper abdominal pain, unspecified: Secondary | ICD-10-CM | POA: Diagnosis present

## 2021-03-23 HISTORY — PX: ESOPHAGOGASTRODUODENOSCOPY (EGD) WITH PROPOFOL: SHX5813

## 2021-03-23 HISTORY — PX: BIOPSY: SHX5522

## 2021-03-23 HISTORY — PX: FOREIGN BODY REMOVAL: SHX962

## 2021-03-23 SURGERY — ESOPHAGOGASTRODUODENOSCOPY (EGD) WITH PROPOFOL
Anesthesia: General

## 2021-03-23 MED ORDER — FENTANYL CITRATE (PF) 100 MCG/2ML IJ SOLN
INTRAMUSCULAR | Status: AC
  Filled 2021-03-23: qty 2

## 2021-03-23 MED ORDER — SUCCINYLCHOLINE CHLORIDE 200 MG/10ML IV SOSY
PREFILLED_SYRINGE | INTRAVENOUS | Status: DC | PRN
  Administered 2021-03-23: 120 mg via INTRAVENOUS

## 2021-03-23 MED ORDER — LACTATED RINGERS IV SOLN: INTRAVENOUS | Status: DC

## 2021-03-23 MED ORDER — PHENYLEPHRINE 40 MCG/ML (10ML) SYRINGE FOR IV PUSH (FOR BLOOD PRESSURE SUPPORT)
PREFILLED_SYRINGE | INTRAVENOUS | Status: DC | PRN
  Administered 2021-03-23: 40 ug via INTRAVENOUS
  Administered 2021-03-23: 80 ug via INTRAVENOUS
  Administered 2021-03-23: 40 ug via INTRAVENOUS
  Administered 2021-03-23 (×3): 80 ug via INTRAVENOUS

## 2021-03-23 MED ORDER — PROPOFOL 10 MG/ML IV BOLUS
INTRAVENOUS | Status: DC | PRN
  Administered 2021-03-23: 150 mg via INTRAVENOUS

## 2021-03-23 MED ORDER — SODIUM CHLORIDE 0.9 % IV SOLN: INTRAVENOUS | Status: DC

## 2021-03-23 MED ORDER — SUCRALFATE 1 GM/10ML PO SUSP: 1.0000 g | Freq: Four times a day (QID) | ORAL | 5 refills | Status: DC

## 2021-03-23 MED ORDER — LIDOCAINE 2% (20 MG/ML) 5 ML SYRINGE
INTRAMUSCULAR | Status: DC | PRN
  Administered 2021-03-23: 40 mg via INTRAVENOUS

## 2021-03-23 MED ORDER — PHENYLEPHRINE HCL-NACL 10-0.9 MG/250ML-% IV SOLN
INTRAVENOUS | Status: DC | PRN
  Administered 2021-03-23: 30 ug/min via INTRAVENOUS

## 2021-03-23 MED ORDER — PROPOFOL 10 MG/ML IV BOLUS
INTRAVENOUS | Status: AC
  Filled 2021-03-23: qty 20

## 2021-03-23 MED ORDER — FENTANYL CITRATE (PF) 250 MCG/5ML IJ SOLN
INTRAMUSCULAR | Status: DC | PRN
  Administered 2021-03-23: 50 ug via INTRAVENOUS

## 2021-03-23 MED ORDER — ROCURONIUM BROMIDE 10 MG/ML (PF) SYRINGE
PREFILLED_SYRINGE | INTRAVENOUS | Status: DC | PRN
Start: 2021-03-23 — End: 2021-03-23
  Administered 2021-03-23: 5 mg via INTRAVENOUS
  Administered 2021-03-23: 30 mg via INTRAVENOUS
  Administered 2021-03-23: 5 mg via INTRAVENOUS

## 2021-03-23 MED ORDER — PROPOFOL 500 MG/50ML IV EMUL
INTRAVENOUS | Status: AC
  Filled 2021-03-23: qty 50

## 2021-03-23 MED ORDER — SODIUM CHLORIDE 0.9 % IV SOLN
INTRAVENOUS | Status: DC | PRN
  Administered 2021-03-23: 20 mL

## 2021-03-23 MED ORDER — ONDANSETRON HCL 4 MG/2ML IJ SOLN
INTRAMUSCULAR | Status: DC | PRN
  Administered 2021-03-23: 4 mg via INTRAVENOUS

## 2021-03-23 MED ORDER — SUGAMMADEX SODIUM 200 MG/2ML IV SOLN
INTRAVENOUS | Status: DC | PRN
Start: 2021-03-23 — End: 2021-03-23
  Administered 2021-03-23: 200 mg via INTRAVENOUS

## 2021-03-23 SURGICAL SUPPLY — 14 items

## 2021-03-23 NOTE — Telephone Encounter (Signed)
-----   Message from Irving Copas., MD sent at 03/23/2021  2:44 PM EDT ----- Regarding: Repeat EGD with dilation Bryan Vargas, Can you please put in a case request for 8/12 for repeat EGD with dilation? I have spoken with the Endo staff and they are temporarily putting a hold for 1215/1230. If you can confirm that then patient will be set up for repeat dilation. Fluoroscopy needed for procedure. Thanks. GM

## 2021-03-23 NOTE — Transfer of Care (Signed)
Immediate Anesthesia Transfer of Care Note  Patient: Zahkai Mershon  Procedure(s) Performed: ESOPHAGOGASTRODUODENOSCOPY (EGD) WITH PROPOFOL FOREIGN BODY REMOVAL Balloon dilation wire-guided BIOPSY  Patient Location: PACU and Endoscopy Unit  Anesthesia Type:General  Level of Consciousness: awake, alert  and patient cooperative  Airway & Oxygen Therapy: Patient Spontanous Breathing and Patient connected to face mask oxygen  Post-op Assessment: Report given to RN and Post -op Vital signs reviewed and stable  Post vital signs: Reviewed and stable  Last Vitals:  Vitals Value Taken Time  BP 143/67 03/23/21 1346  Temp    Pulse 72 03/23/21 1347  Resp 22 03/23/21 1347  SpO2 100 % 03/23/21 1347  Vitals shown include unvalidated device data.  Last Pain:  Vitals:   03/23/21 1107  TempSrc: Temporal  PainSc: 0-No pain         Complications: No notable events documented.

## 2021-03-23 NOTE — Op Note (Addendum)
[facility] Patient Name: Bryan Vargas Procedure Date: 03/23/2021 MRN: [id number] Attending MD: Justice Britain , MD Date of Birth: [birth year] CSN: [id number] Age: 70 Admit Type: Outpatient Procedure:                Upper GI endoscopy Indications:              Upper abdominal pain, For dilation and stenting of                            duodenal stenosis, Duodenal ulcer with obstruction Providers:                Justice Britain, MD, Ladona Ridgel, Technician,                            Jefm Miles CRNA, Kary Kos RN, RN Referring MD:             Norvel Richards, MD, Hildred Laser, MD, Quentin Cornwall Medicines:                General Anesthesia Complications:            No immediate complications. Estimated Blood Loss:     Estimated blood loss was minimal. Procedure:                Pre-Anesthesia Assessment:                           - Prior to the procedure, a History and Physical                            was performed, and patient medications and                            allergies were reviewed. The patient's tolerance of                            previous anesthesia was also reviewed. The risks                            and benefits of the procedure and the sedation                            options and risks were discussed with the patient.                            All questions were answered, and informed consent                            was obtained. Prior Anticoagulants: The patient has                            taken no previous anticoagulant or antiplatelet  agents except for aspirin. ASA Grade Assessment:                            III - A patient with severe systemic disease. After                            reviewing the risks and benefits, the patient was                            deemed in satisfactory condition to undergo the                            procedure.                            After obtaining informed consent, the endoscope was                            passed under direct vision. Throughout the                            procedure, the patient's blood pressure, pulse, and                            oxygen saturations were monitored continuously. The                            GIf-[id number] ([id number]) Olympus therapeutic endoscope                            was introduced through the mouth, and advanced to                            the duodenal bulb. The GIF-[id number] ([id number]) Olympus                            slim endoscope was introduced through the mouth,                            and advanced to the second part of duodenum. The                            upper GI endoscopy was accomplished without                            difficulty. The patient tolerated the procedure. Scope In: Scope Out: Findings:      No gross lesions were noted in the entire esophagus.      The Z-line was irregular and was found 45 cm from the incisors.      Retained fluid was found in the entire examined stomach. Suction via       Endoscope was performed for a total of 1600 mL.      Diffuse moderately erythematous mucosa without bleeding was found in the       entire  examined stomach.      Food (residue) was found in the duodenal bulb. Removal of food was       accomplished with a Roth net.      An acquired severe stenosis was found in the duodenal bulb moving into       the D1/D2 sweep and was only traversed with the pediatric endoscope.       Biopsies were taken with a cold forceps for histology from the       stricture. The stenosis is at least 2-3 cm in length. Placement of a       0.035 inch x 450 cm angled Hydra Jagwire was attempted. This passed       successfully. I injected contrast through an above-balloon and outlined       the stenosis corroborating a short stricture of 2-3 cm. A TTS dilator       was passed through the scope over the wire. Dilation with a  Hurricane 6       mm balloon and then an 03-31-09 mm pyloric balloon dilator was performed       under fluoroscopic guidance. Mucosal wrenting was appreciated.      No gross lesions were noted in the second portion of the duodenum. Impression:               - No gross lesions in esophagus. Z-line irregular,                            45 cm from the incisors.                           - Retained gastric fluid.                           - Erythematous mucosa in the stomach.                           - Retained food in the duodenum. Removal was                            successful.                           - Acquired duodenal stenosis within the bulb.-                            traversed with the pediatric endoscope. Biopsied.                            Dilated.                           - No gross lesions in the second portion of the                            duodenum. Moderate Sedation:      Not Applicable - Patient had care per Anesthesia. Recommendation:           - The patient will be observed post-procedure,  until all discharge criteria are met.                           - Discharge patient to home.                           - Patient has a contact number available for                            emergencies. The signs and symptoms of potential                            delayed complications were discussed with the                            patient. Return to normal activities tomorrow.                            Written discharge instructions were provided to the                            patient.                           - Full liquid diet.                           - Continue PPI twice daily.                           - Start Carafate 3-4 times daily (liquid ideal over                            capsule/tablet).                           - Observe patient's clinical course.                           - Await pathology results.                            - Repeat upper endoscopy in 1-2 weeks for                            retreatment (will try to find a date when this is                            possible).                           - AXIOS stenting is not possible as the stricture                            is too long.                           -  If after 3 dilations we are not making headway                            will need to consider surgical evaluation.                           - The findings and recommendations were discussed                            with the patient.                           - The findings and recommendations were discussed                            with the designated responsible adult. Procedure Code(s):        --- Professional ---                           [phone], Esophagogastroduodenoscopy, flexible,                            transoral; with removal of foreign body(s)                           [id number], Esophagogastroduodenoscopy, flexible,                            transoral; with dilation of gastric/duodenal                            stricture(s) (eg, balloon, bougie) Diagnosis Code(s):        --- Professional ---                           K22.8, Other specified diseases of esophagus                           K31.89, Other diseases of stomach and duodenum                           T18.3XXA, Foreign body in small intestine, initial                            encounter                           K31.5, Obstruction of duodenum                           R10.10, Upper abdominal pain, unspecified                           K26.9, Duodenal ulcer, unspecified as acute or                            chronic, without hemorrhage or perforation  CPT copyright 2019 American Medical Association. All rights reserved. The codes documented in this report are preliminary and upon coder review may  be revised to meet current compliance requirements. Justice Britain, MD 03/23/2021 2:07:27 PM Number of Addenda: 0

## 2021-03-23 NOTE — Discharge Instructions (Signed)
YOU HAD AN ENDOSCOPIC PROCEDURE TODAY: Refer to the procedure report and other information in the discharge instructions given to you for any specific questions about what was found during the examination. If this information does not answer your questions, please call [place] office at [phone] to clarify.   YOU SHOULD EXPECT: Some feelings of bloating in the abdomen. Passage of more gas than usual. Walking can help get rid of the air that was put into your GI tract during the procedure and reduce the bloating. If you had a lower endoscopy (such as a colonoscopy or flexible sigmoidoscopy) you may notice spotting of blood in your stool or on the toilet paper. Some abdominal soreness may be present for a day or two, also.  DIET: Your first meal following the procedure should be a light meal and then it is ok to progress to your normal diet. A half-sandwich or bowl of soup is an example of a good first meal. Heavy or fried foods are harder to digest and may make you feel nauseous or bloated. Drink plenty of fluids but you should avoid alcoholic beverages for 24 hours. If you had a esophageal dilation, please see attached instructions for diet.    ACTIVITY: Your care partner should take you home directly after the procedure. You should plan to take it easy, moving slowly for the rest of the day. You can resume normal activity the day after the procedure however YOU SHOULD NOT DRIVE, use power tools, machinery or perform tasks that involve climbing or major physical exertion for 24 hours (because of the sedation medicines used during the test).   SYMPTOMS TO REPORT IMMEDIATELY: A gastroenterologist can be reached at any hour. Please call [phone]  for any of the following symptoms:   Following upper endoscopy (EGD, EUS, ERCP, esophageal dilation) Vomiting of blood or coffee ground material  New, significant abdominal pain  New, significant chest pain or pain under the shoulder blades  Painful or  persistently difficult swallowing  New shortness of breath  Black, tarry-looking or red, bloody stools  FOLLOW UP:  If any biopsies were taken you will be contacted by phone or by letter within the next 1-3 weeks. Call [phone]  if you have not heard about the biopsies in 3 weeks.  Please also call with any specific questions about appointments or follow up tests.  

## 2021-03-23 NOTE — Anesthesia Preprocedure Evaluation (Signed)
Anesthesia Evaluation  Patient identified by MRN, date of birth, ID band Patient awake    Reviewed: Allergy & Precautions, NPO status , Patient's Chart, lab work & pertinent test results  History of Anesthesia Complications Negative for: history of anesthetic complications  Airway Mallampati: III  TM Distance: >3 FB Neck ROM: Full    Dental  (+) Dental Advisory Given, Teeth Intact   Pulmonary neg pulmonary ROS, neg shortness of breath, neg sleep apnea, neg COPD, neg recent URI,  Covid-19 Nucleic Acid Test Results Lab Results      Component                Value               Date                      SARSCOV2NAA              NEGATIVE            02/25/2021              breath sounds clear to auscultation       Cardiovascular (-) hypertension(-) angina+ CAD  (-) Past MI and (-) CHF (-) dysrhythmias  Rhythm:Regular     Neuro/Psych negative neurological ROS  negative psych ROS   GI/Hepatic PUD, GERD  ,(+) Hepatitis -  Endo/Other  diabetes, Type 2  Renal/GU negative Renal ROS     Musculoskeletal   Abdominal   Peds  Hematology  (+) Blood dyscrasia, anemia , Lab Results      Component                Value               Date                      WBC                      11.9 (H)            03/07/2021                HGB                      12.7 (L)            03/07/2021                HCT                      38.4 (L)            03/07/2021                MCV                      95.8                03/07/2021                PLT                      188                 03/07/2021              Anesthesia Other Findings   Reproductive/Obstetrics  Anesthesia Physical Anesthesia Plan  ASA: 2  Anesthesia Plan: General   Post-op Pain Management:    Induction: Intravenous  PONV Risk Score and Plan: 2 and Ondansetron and Dexamethasone  Airway Management Planned:  Oral ETT and Video Laryngoscope Planned  Additional Equipment: None  Intra-op Plan:   Post-operative Plan: Extubation in OR  Informed Consent: I have reviewed the patients History and Physical, chart, labs and discussed the procedure including the risks, benefits and alternatives for the proposed anesthesia with the patient or authorized representative who has indicated his/her understanding and acceptance.     Dental advisory given  Plan Discussed with: CRNA and Surgeon  Anesthesia Plan Comments:         Anesthesia Quick Evaluation

## 2021-03-23 NOTE — Anesthesia Procedure Notes (Signed)
Procedure Name: Intubation Date/Time: 03/23/2021 12:10 PM Performed by: Eben Burow, CRNA Pre-anesthesia Checklist: Patient identified, Emergency Drugs available, Suction available, Patient being monitored and Timeout performed Patient Re-evaluated:Patient Re-evaluated prior to induction Oxygen Delivery Method: Circle system utilized Preoxygenation: Pre-oxygenation with 100% oxygen Induction Type: IV induction and Rapid sequence Ventilation: Mask ventilation without difficulty Laryngoscope Size: Glidescope and 4 Tube type: Oral Tube size: 7.5 mm Number of attempts: 1 Airway Equipment and Method: Stylet Placement Confirmation: ETT inserted through vocal cords under direct vision, positive ETCO2 and breath sounds checked- equal and bilateral Secured at: 22 cm Tube secured with: Tape Dental Injury: Teeth and Oropharynx as per pre-operative assessment

## 2021-03-23 NOTE — H&P (Signed)
GASTROENTEROLOGY PROCEDURE H&P NOTE   Primary Care Physician: Quentin Cornwall, MD  HPI: Bryan Vargas is a 70 y.o. 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  Past Medical History:  Diagnosis Date   Arthritis    Coronary artery calcification seen on CAT scan 09/2020   Diabetes ([place])    Gout    Hepatitis    at age 25   Past Surgical History:  Procedure Laterality Date   arthritis     BALLOON DILATION N/A 03/02/2021   Procedure: BALLOON DILATION;  Surgeon: Daneil Dolin, MD;  Location: [facility];  Service: Gastroenterology;  Laterality: N/A;   BIOPSY  02/27/2021   Procedure: BIOPSY;  Surgeon: Rogene Houston, MD;  [facility];  Service: Endoscopy;;   BIOPSY  03/02/2021   Procedure: BIOPSY;  Surgeon: Daneil Dolin, MD;  Location: [facility];  Service: Gastroenterology;;   CYSTOSCOPY W/ URETERAL STENT PLACEMENT     ESOPHAGOGASTRODUODENOSCOPY (EGD) WITH PROPOFOL N/A 02/27/2021   Procedure: ESOPHAGOGASTRODUODENOSCOPY (EGD) WITH PROPOFOL;  Surgeon: Rogene Houston, MD;  [facility];  Service: Endoscopy;  Laterality: N/A;   ESOPHAGOGASTRODUODENOSCOPY (EGD) WITH PROPOFOL N/A 03/02/2021   Procedure: ESOPHAGOGASTRODUODENOSCOPY (EGD) WITH PROPOFOL;  Surgeon: Daneil Dolin, MD;  Location: [facility];  Service: Gastroenterology;  Laterality: N/A;  to be done in OR per anesthesia   EXTRACORPOREAL SHOCK WAVE LITHOTRIPSY     EXTRACORPOREAL SHOCK WAVE LITHOTRIPSY     PROSTATE BIOPSY     TONSILLECTOMY     Current Facility-Administered Medications  Medication Dose Route Frequency Provider Last Rate Last Admin   0.9 %  sodium chloride infusion   Intravenous Continuous Mansouraty, Telford Nab., MD       lactated ringers infusion   Intravenous Continuous Briant Cedar, Coralie Keens, MD       No Known Allergies Family History  Problem Relation Age of Onset   Cancer Brother        patient can't remember what kind   Colon cancer Neg Hx    Celiac disease Neg Hx     Inflammatory bowel disease Neg Hx    Social History   Socioeconomic History   Marital status: Single    Spouse name: Not on file   Number of children: 2   Years of education: Not on file   Highest education level: Not on file  Occupational History   Occupation: retired  Tobacco Use   Smoking status: Never   Smokeless tobacco: Never  Vaping Use   Vaping Use: Never used  Substance and Sexual Activity   Alcohol use: Not Currently   Drug use: Never   Sexual activity: Not on file  Other Topics Concern   Not on file  Social History Narrative   Not on file   Social Determinants of Health   Financial Resource Strain: Not on file  Food Insecurity: Not on file  Transportation Needs: Not on file  Physical Activity: Not on file  Stress: Not on file  Social Connections: Not on file  Intimate Partner Violence: Not on file    Physical Exam: Vital signs in last 24 hours: Temp:  [97.2 F (36.2 C)] 97.2 F (36.2 C) (08/01 1107) Pulse Rate:  [72] 72 (08/01 1107) Resp:  [12] 12 (08/01 1107) BP: (147)/(66) 147/66 (08/01 1107) SpO2:  [100 %] 100 % (08/01 1107) Weight:  [99.8 kg] 99.8 kg (08/01 1107)   GEN: NAD EYE: Sclerae anicteric ENT: MMM CV: Non-tachycardic GI:  Soft, NT/ND NEURO:  Alert & Oriented x 3  Lab Results: No results for input(s): WBC, HGB, HCT, PLT in the last 72 hours. BMET No results for input(s): NA, K, CL, CO2, GLUCOSE, BUN, CREATININE, CALCIUM in the last 72 hours. LFT No results for input(s): PROT, ALBUMIN, AST, ALT, ALKPHOS, BILITOT, BILIDIR, IBILI in the last 72 hours. PT/INR No results for input(s): LABPROT, INR in the last 72 hours.   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  The risks and benefits of endoscopic evaluation were discussed with the patient; these include but are not limited to the risk of perforation, infection, bleeding, missed lesions, lack of diagnosis, severe  illness requiring hospitalization, as well as anesthesia and sedation related illnesses.  The patient is agreeable to proceed.   The risks are higher in this patient with recurrent stricturing so quickly.  Time will tell, hopefully not malignant with plan to repeat biopsies if possible.   Justice Britain, MD [facility] # [phone]

## 2021-03-24 ENCOUNTER — Other Ambulatory Visit: Payer: Self-pay

## 2021-03-24 ENCOUNTER — Encounter (HOSPITAL_COMMUNITY): Payer: Self-pay | Admitting: Gastroenterology

## 2021-03-24 DIAGNOSIS — K222 Esophageal obstruction: Secondary | ICD-10-CM

## 2021-03-24 NOTE — Anesthesia Postprocedure Evaluation (Signed)
Anesthesia Post Note  Patient: [name]  Procedure(s) Performed: ESOPHAGOGASTRODUODENOSCOPY (EGD) WITH PROPOFOL FOREIGN BODY REMOVAL Balloon dilation wire-guided BIOPSY     Patient location during evaluation: Endoscopy Anesthesia Type: General Level of consciousness: awake and alert Pain management: pain level controlled Vital Signs Assessment: post-procedure vital signs reviewed and stable Respiratory status: spontaneous breathing, nonlabored ventilation, respiratory function stable and patient connected to nasal cannula oxygen Cardiovascular status: blood pressure returned to baseline and stable Postop Assessment: no apparent nausea or vomiting Anesthetic complications: no   No notable events documented.  Last Vitals:  Vitals:   03/23/21 1400 03/23/21 1420  BP: 130/69 133/75  Pulse: 70 65  Resp: 15 17  Temp:    SpO2: 97% 97%    Last Pain:  Vitals:   03/24/21 1335  TempSrc:   PainSc: 0-No pain                 Alzena Gerber

## 2021-03-24 NOTE — Telephone Encounter (Signed)
The pt has been scheduled for 04/03/21 at 1230 pm at [facility] for EGD DIL with GM.    Left message on machine to call back

## 2021-03-24 NOTE — Telephone Encounter (Signed)
EGD scheduled, pt instructed and medications reviewed.  Patient instructions mailed to home.  Patient to call with any questions or concerns.  

## 2021-04-01 LAB — SURGICAL PATHOLOGY

## 2021-04-02 ENCOUNTER — Encounter: Payer: Self-pay | Admitting: Gastroenterology

## 2021-04-03 ENCOUNTER — Encounter (HOSPITAL_COMMUNITY): Payer: Self-pay | Admitting: Gastroenterology

## 2021-04-03 ENCOUNTER — Encounter (HOSPITAL_COMMUNITY)
Admission: RE | Disposition: A | Discharge status: Home / Self Care | Payer: Self-pay | Source: Home / Self Care | Attending: Gastroenterology

## 2021-04-03 ENCOUNTER — Ambulatory Visit (HOSPITAL_COMMUNITY)
Admission: RE | Admit: 2021-04-03 | Discharge: 2021-04-03 | Disposition: A | Discharge status: Home / Self Care | Payer: Medicare Other | Attending: Gastroenterology | Admitting: Gastroenterology

## 2021-04-03 ENCOUNTER — Ambulatory Visit (HOSPITAL_COMMUNITY): Payer: Medicare Other | Admitting: Anesthesiology

## 2021-04-03 ENCOUNTER — Ambulatory Visit (HOSPITAL_COMMUNITY): Payer: Medicare Other

## 2021-04-03 DIAGNOSIS — K315 Obstruction of duodenum: Secondary | ICD-10-CM

## 2021-04-03 DIAGNOSIS — K209 Esophagitis, unspecified without bleeding: Secondary | ICD-10-CM | POA: Diagnosis not present

## 2021-04-03 DIAGNOSIS — K3189 Other diseases of stomach and duodenum: Secondary | ICD-10-CM

## 2021-04-03 DIAGNOSIS — K222 Esophageal obstruction: Secondary | ICD-10-CM

## 2021-04-03 DIAGNOSIS — K31A19 Gastric intestinal metaplasia without dysplasia, unspecified site: Secondary | ICD-10-CM | POA: Insufficient documentation

## 2021-04-03 DIAGNOSIS — K2980 Duodenitis without bleeding: Secondary | ICD-10-CM | POA: Diagnosis not present

## 2021-04-03 HISTORY — PX: BALLOON DILATION: SHX5330

## 2021-04-03 HISTORY — PX: ESOPHAGOGASTRODUODENOSCOPY (EGD) WITH PROPOFOL: SHX5813

## 2021-04-03 HISTORY — PX: IMPACTION REMOVAL: SHX5858

## 2021-04-03 SURGERY — ESOPHAGOGASTRODUODENOSCOPY (EGD) WITH PROPOFOL
Anesthesia: Monitor Anesthesia Care

## 2021-04-03 MED ORDER — SUCCINYLCHOLINE CHLORIDE 200 MG/10ML IV SOSY
PREFILLED_SYRINGE | INTRAVENOUS | Status: DC | PRN
  Administered 2021-04-03: 140 mg via INTRAVENOUS

## 2021-04-03 MED ORDER — SUCRALFATE 1 GM/10ML PO SUSP: 1.0000 g | Freq: Four times a day (QID) | ORAL | 5 refills | Status: DC

## 2021-04-03 MED ORDER — LIDOCAINE 2% (20 MG/ML) 5 ML SYRINGE
INTRAMUSCULAR | Status: DC | PRN
  Administered 2021-04-03: 60 mg via INTRAVENOUS

## 2021-04-03 MED ORDER — ONDANSETRON HCL 4 MG/2ML IJ SOLN
INTRAMUSCULAR | Status: DC | PRN
  Administered 2021-04-03: 4 mg via INTRAVENOUS

## 2021-04-03 MED ORDER — CIPROFLOXACIN IN D5W 400 MG/200ML IV SOLN
INTRAVENOUS | Status: AC
  Filled 2021-04-03: qty 200

## 2021-04-03 MED ORDER — SODIUM CHLORIDE 0.9 % IV SOLN
INTRAVENOUS | Status: DC | PRN
  Administered 2021-04-03: 10 mL

## 2021-04-03 MED ORDER — DEXAMETHASONE SODIUM PHOSPHATE 10 MG/ML IJ SOLN
INTRAMUSCULAR | Status: DC | PRN
  Administered 2021-04-03: 10 mg via INTRAVENOUS

## 2021-04-03 MED ORDER — LACTATED RINGERS IV SOLN: INTRAVENOUS | Status: DC

## 2021-04-03 MED ORDER — EPHEDRINE SULFATE-NACL 50-0.9 MG/10ML-% IV SOSY
PREFILLED_SYRINGE | INTRAVENOUS | Status: DC | PRN
  Administered 2021-04-03 (×2): 5 mg via INTRAVENOUS
  Administered 2021-04-03: 10 mg via INTRAVENOUS
  Administered 2021-04-03: 5 mg via INTRAVENOUS

## 2021-04-03 MED ORDER — CIPROFLOXACIN IN D5W 400 MG/200ML IV SOLN
INTRAVENOUS | Status: DC | PRN
  Administered 2021-04-03: 400 mg via INTRAVENOUS

## 2021-04-03 MED ORDER — PROPOFOL 10 MG/ML IV BOLUS
INTRAVENOUS | Status: DC | PRN
  Administered 2021-04-03: 150 mg via INTRAVENOUS

## 2021-04-03 MED ORDER — SODIUM CHLORIDE 0.9 % IV SOLN: INTRAVENOUS | Status: DC

## 2021-04-03 SURGICAL SUPPLY — 14 items

## 2021-04-03 NOTE — Anesthesia Postprocedure Evaluation (Signed)
Anesthesia Post Note  Patient: Leonel Mccollum  Procedure(s) Performed: ESOPHAGOGASTRODUODENOSCOPY (EGD) WITH PROPOFOL BALLOON DILATION IMPACTION REMOVAL     Patient location during evaluation: PACU Anesthesia Type: MAC Level of consciousness: awake and alert Pain management: pain level controlled Vital Signs Assessment: post-procedure vital signs reviewed and stable Respiratory status: spontaneous breathing, nonlabored ventilation, respiratory function stable and patient connected to nasal cannula oxygen Cardiovascular status: stable and blood pressure returned to baseline Postop Assessment: no apparent nausea or vomiting Anesthetic complications: no   No notable events documented.  Last Vitals:  Vitals:   04/03/21 1442 04/03/21 1450  BP: (!) 147/75 (!) 146/72  Pulse: 76 87  Resp: 18 20  Temp: 36.6 C   SpO2: 100% 95%    Last Pain:  Vitals:   04/03/21 1450  TempSrc:   PainSc: 0-No pain                 Domique Clapper

## 2021-04-03 NOTE — H&P (Signed)
GASTROENTEROLOGY PROCEDURE H&P NOTE   Primary Care Physician: Quentin Cornwall, MD  HPI: Bryan Vargas is a 70 y.o. male who presents for EGD with repeat dilation attempt of duodenal stricture.  Past Medical History:  Diagnosis Date   Arthritis    Coronary artery calcification seen on CAT scan 09/2020   Diabetes ([place])    Gout    Hepatitis    at age 39   Past Surgical History:  Procedure Laterality Date   arthritis     BALLOON DILATION N/A 03/02/2021   Procedure: BALLOON DILATION;  Surgeon: Daneil Dolin, MD;  Location: [facility];  Service: Gastroenterology;  Laterality: N/A;   BIOPSY  02/27/2021   Procedure: BIOPSY;  Surgeon: Rogene Houston, MD;  [facility];  Service: Endoscopy;;   BIOPSY  03/02/2021   Procedure: BIOPSY;  Surgeon: Daneil Dolin, MD;  Location: [facility];  Service: Gastroenterology;;   BIOPSY  03/23/2021   Procedure: BIOPSY;  Surgeon: Irving Copas., MD;  Location: [facility];  Service: Gastroenterology;;   CYSTOSCOPY W/ URETERAL STENT PLACEMENT     ESOPHAGEAL DILATION N/A 03/18/2021   Procedure: ESOPHAGEAL DILATION;  Surgeon: Rogene Houston, MD;  [facility];  Service: Endoscopy;  Laterality: N/A;   ESOPHAGOGASTRODUODENOSCOPY (EGD) WITH PROPOFOL N/A 02/27/2021   Procedure: ESOPHAGOGASTRODUODENOSCOPY (EGD) WITH PROPOFOL;  Surgeon: Rogene Houston, MD;  [facility];  Service: Endoscopy;  Laterality: N/A;   ESOPHAGOGASTRODUODENOSCOPY (EGD) WITH PROPOFOL N/A 03/02/2021   Procedure: ESOPHAGOGASTRODUODENOSCOPY (EGD) WITH PROPOFOL;  Surgeon: Daneil Dolin, MD;  Location: [facility];  Service: Gastroenterology;  Laterality: N/A;  to be done in OR per anesthesia   ESOPHAGOGASTRODUODENOSCOPY (EGD) WITH PROPOFOL N/A 03/18/2021   Procedure: ESOPHAGOGASTRODUODENOSCOPY (EGD) WITH PROPOFOL;  Surgeon: Rogene Houston, MD;  [facility];  Service: Endoscopy;  Laterality: N/A;  1:05   ESOPHAGOGASTRODUODENOSCOPY (EGD) WITH PROPOFOL  N/A 03/23/2021   Procedure: ESOPHAGOGASTRODUODENOSCOPY (EGD) WITH PROPOFOL;  Surgeon: Rush Landmark Telford Nab., MD;  Location: [facility];  Service: Gastroenterology;  Laterality: N/A;   EXTRACORPOREAL SHOCK WAVE LITHOTRIPSY     EXTRACORPOREAL SHOCK WAVE LITHOTRIPSY     FOREIGN BODY REMOVAL  03/23/2021   Procedure: FOREIGN BODY REMOVAL;  Surgeon: Rush Landmark, Telford Nab., MD;  Location: [facility];  Service: Gastroenterology;;   PROSTATE BIOPSY     TONSILLECTOMY     Current Facility-Administered Medications  Medication Dose Route Frequency Provider Last Rate Last Admin   0.9 %  sodium chloride infusion   Intravenous Continuous Mansouraty, Telford Nab., MD       lactated ringers infusion   Intravenous Continuous Mansouraty, Telford Nab., MD 20 mL/hr at 04/03/21 1151 New Bag at 04/03/21 1151    Current Facility-Administered Medications:    0.9 %  sodium chloride infusion, , Intravenous, Continuous, Mansouraty, Telford Nab., MD   lactated ringers infusion, , Intravenous, Continuous, Mansouraty, Telford Nab., MD, Last Rate: 20 mL/hr at 04/03/21 1151, New Bag at 04/03/21 1151 No Known Allergies Family History  Problem Relation Age of Onset   Cancer Brother        patient can't remember what kind   Colon cancer Neg Hx    Celiac disease Neg Hx    Inflammatory bowel disease Neg Hx    Social History   Socioeconomic History   Marital status: Single    Spouse name: Not on file   Number of children: 2   Years of education: Not on file   Highest education  level: Not on file  Occupational History   Occupation: retired  Tobacco Use   Smoking status: Never   Smokeless tobacco: Never  Vaping Use   Vaping Use: Never used  Substance and Sexual Activity   Alcohol use: Not Currently   Drug use: Never   Sexual activity: Not on file  Other Topics Concern   Not on file  Social History Narrative   Not on file   Social Determinants of Health   Financial Resource Strain: Not on file  Food  Insecurity: Not on file  Transportation Needs: Not on file  Physical Activity: Not on file  Stress: Not on file  Social Connections: Not on file  Intimate Partner Violence: Not on file    Physical Exam: Vital signs in last 24 hours: Temp:  [98.1 F (36.7 C)] 98.1 F (36.7 C) (08/12 1125) Pulse Rate:  [82] 82 (08/12 1125) Resp:  [16] 16 (08/12 1125) BP: (135)/(78) 135/78 (08/12 1125) SpO2:  [98 %] 98 % (08/12 1125) Weight:  [99.8 kg] 99.8 kg (08/12 1125)   GEN: NAD EYE: Sclerae anicteric ENT: MMM CV: Non-tachycardic GI: Soft, NT/ND NEURO:  Alert & Oriented x 3  Lab Results: No results for input(s): WBC, HGB, HCT, PLT in the last 72 hours. BMET No results for input(s): NA, K, CL, CO2, GLUCOSE, BUN, CREATININE, CALCIUM in the last 72 hours. LFT No results for input(s): PROT, ALBUMIN, AST, ALT, ALKPHOS, BILITOT, BILIDIR, IBILI in the last 72 hours. PT/INR No results for input(s): LABPROT, INR in the last 72 hours.   Impression / Plan: This is a 70 y.o.male who presents for EGD with repeat dilation attempt of duodenal stricture.  The risks and benefits of endoscopic evaluation/treatment were discussed with the patient and/or family; these include but are not limited to the risk of perforation, infection, bleeding, missed lesions, lack of diagnosis, severe illness requiring hospitalization, as well as anesthesia and sedation related illnesses.  The patient's history has been reviewed, patient examined, no change in status, and deemed stable for procedure.  The patient and/or family is agreeable to proceed.    Justice Britain, MD [facility] # [phone]

## 2021-04-03 NOTE — Transfer of Care (Signed)
Immediate Anesthesia Transfer of Care Note  Patient: Bryan Vargas  Procedure(s) Performed: ESOPHAGOGASTRODUODENOSCOPY (EGD) WITH PROPOFOL BALLOON DILATION  Patient Location: PACU and Endoscopy Unit  Anesthesia Type:General  Level of Consciousness: awake, alert  and patient cooperative  Airway & Oxygen Therapy: Patient Spontanous Breathing and Patient connected to face mask oxygen  Post-op Assessment: Report given to RN and Post -op Vital signs reviewed and stable  Post vital signs: Reviewed and stable  Last Vitals:  Vitals Value Taken Time  BP 147/75 04/03/21 1441  Temp    Pulse 76 04/03/21 1442  Resp 18 04/03/21 1441  SpO2 100 % 04/03/21 1442  Vitals shown include unvalidated device data.  Last Pain:  Vitals:   04/03/21 1125  TempSrc: Oral  PainSc: 0-No pain         Complications: No notable events documented.

## 2021-04-03 NOTE — Anesthesia Preprocedure Evaluation (Signed)
Anesthesia Evaluation  Patient identified by MRN, date of birth, ID band Patient awake    Reviewed: Allergy & Precautions, NPO status , Patient's Chart, lab work & pertinent test results  History of Anesthesia Complications Negative for: history of anesthetic complications  Airway Mallampati: III  TM Distance: >3 FB Neck ROM: Full    Dental  (+) Dental Advisory Given, Teeth Intact   Pulmonary neg pulmonary ROS, neg shortness of breath, neg sleep apnea, neg COPD, neg recent URI,    breath sounds clear to auscultation       Cardiovascular (-) hypertension(-) angina+ CAD  (-) Past MI and (-) CHF (-) dysrhythmias  Rhythm:Regular     Neuro/Psych negative neurological ROS  negative psych ROS   GI/Hepatic PUD, GERD  ,(+) Hepatitis -  Endo/Other  diabetes, Type 2  Renal/GU negative Renal ROS     Musculoskeletal  (+) Arthritis , Osteoarthritis,    Abdominal   Peds  Hematology  (+) Blood dyscrasia, anemia ,   Anesthesia Other Findings   Reproductive/Obstetrics                             Anesthesia Physical  Anesthesia Plan  ASA: 3  Anesthesia Plan: MAC   Post-op Pain Management:    Induction: Intravenous  PONV Risk Score and Plan: 2 and Ondansetron and Dexamethasone  Airway Management Planned: Nasal Cannula, Natural Airway and Simple Face Mask  Additional Equipment: None  Intra-op Plan:   Post-operative Plan:   Informed Consent: I have reviewed the patients History and Physical, chart, labs and discussed the procedure including the risks, benefits and alternatives for the proposed anesthesia with the patient or authorized representative who has indicated his/her understanding and acceptance.     Dental advisory given  Plan Discussed with: CRNA and Anesthesiologist  Anesthesia Plan Comments:         Anesthesia Quick Evaluation

## 2021-04-03 NOTE — Discharge Instructions (Signed)
YOU HAD AN ENDOSCOPIC PROCEDURE TODAY: Refer to the procedure report and other information in the discharge instructions given to you for any specific questions about what was found during the examination. If this information does not answer your questions, please call [place] office at [phone] to clarify.   YOU SHOULD EXPECT: Some feelings of bloating in the abdomen. Passage of more gas than usual. Walking can help get rid of the air that was put into your GI tract during the procedure and reduce the bloating. If you had a lower endoscopy (such as a colonoscopy or flexible sigmoidoscopy) you may notice spotting of blood in your stool or on the toilet paper. Some abdominal soreness may be present for a day or two, also.  DIET: Your first meal following the procedure should be a light meal and then it is ok to progress to your normal diet. A half-sandwich or bowl of soup is an example of a good first meal. Heavy or fried foods are harder to digest and may make you feel nauseous or bloated. Drink plenty of fluids but you should avoid alcoholic beverages for 24 hours. If you had a esophageal dilation, please see attached instructions for diet.    ACTIVITY: Your care partner should take you home directly after the procedure. You should plan to take it easy, moving slowly for the rest of the day. You can resume normal activity the day after the procedure however YOU SHOULD NOT DRIVE, use power tools, machinery or perform tasks that involve climbing or major physical exertion for 24 hours (because of the sedation medicines used during the test).   SYMPTOMS TO REPORT IMMEDIATELY: A gastroenterologist can be reached at any hour. Please call [phone]  for any of the following symptoms:   Following upper endoscopy (EGD, EUS, ERCP, esophageal dilation) Vomiting of blood or coffee ground material  New, significant abdominal pain  New, significant chest pain or pain under the shoulder blades  Painful or  persistently difficult swallowing  New shortness of breath  Black, tarry-looking or red, bloody stools  FOLLOW UP:  If any biopsies were taken you will be contacted by phone or by letter within the next 1-3 weeks. Call [phone]  if you have not heard about the biopsies in 3 weeks.  Please also call with any specific questions about appointments or follow up tests.  

## 2021-04-03 NOTE — Anesthesia Procedure Notes (Signed)
Procedure Name: Intubation Date/Time: 04/03/2021 12:46 PM Performed by: Myna Bright, CRNA Pre-anesthesia Checklist: Patient identified, Emergency Drugs available, Suction available and Patient being monitored Patient Re-evaluated:Patient Re-evaluated prior to induction Oxygen Delivery Method: Circle system utilized Preoxygenation: Pre-oxygenation with 100% oxygen Induction Type: IV induction, Rapid sequence and Cricoid Pressure applied Laryngoscope Size: Glidescope and 3 Tube type: Oral Tube size: 7.5 mm Number of attempts: 1 Airway Equipment and Method: Stylet and Video-laryngoscopy Placement Confirmation: ETT inserted through vocal cords under direct vision, positive ETCO2 and breath sounds checked- equal and bilateral Secured at: 22 cm Tube secured with: Tape Dental Injury: Teeth and Oropharynx as per pre-operative assessment  Difficulty Due To: Difficult Airway- due to limited oral opening and Difficult Airway- due to reduced neck mobility Comments: Glidescope used d/t anticipated difficult airway. Good view with Glide. Atraumatic oral intubation.

## 2021-04-03 NOTE — Op Note (Signed)
[facility] Patient Name: Bryan Vargas Procedure Date: 04/03/2021 MRN: [id number] Attending MD: Justice Britain , MD Date of Birth: 29-Jan-1951 CSN: [id number] Age: 70 Admit Type: Outpatient Procedure:                Upper GI endoscopy Indications:              Stenosis of the duodenum, Follow-up of duodenal                            stenosis Providers:                Justice Britain, MD, Doristine Johns, RN, Elspeth Cho Tech., Technician, Barrett Henle, CRNA Referring MD:             Hildred Laser, MD, Norvel Richards, MD, Quentin Cornwall Medicines:                General Anesthesia Complications:            No immediate complications. Estimated Blood Loss:     Estimated blood loss was minimal. Procedure:                Pre-Anesthesia Assessment:                           - Prior to the procedure, a History and Physical                            was performed, and patient medications and                            allergies were reviewed. The patient's tolerance of                            previous anesthesia was also reviewed. The risks                            and benefits of the procedure and the sedation                            options and risks were discussed with the patient.                            All questions were answered, and informed consent                            was obtained. Prior Anticoagulants: The patient has                            taken no previous anticoagulant or antiplatelet  agents. ASA Grade Assessment: III - A patient with                            severe systemic disease. After reviewing the risks                            and benefits, the patient was deemed in                            satisfactory condition to undergo the procedure.                           After obtaining informed consent, the endoscope was                             passed under direct vision. Throughout the                            procedure, the patient's blood pressure, pulse, and                            oxygen saturations were monitored continuously. The                            GIF-[id number] ([id number]) Olympus slim endoscope was                            introduced through the mouth, and advanced to the                            duodenal bulb. The GIF-H190 ([id number]) Olympus                            endoscope was introduced through the mouth, and                            advanced to the duodenal bulb. The GIF-[id number]                            ([id number]) Olympus therapeutic endoscope was                            introduced through the mouth, and advanced to the                            duodenal bulb. The upper GI endoscopy was                            technically difficult and complex due to stenosis.                            Successful completion of the procedure was aided by  performing the maneuvers documented (below) in this                            report. The patient tolerated the procedure. Scope In: Scope Out: Findings:      No gross lesions were noted in the proximal esophagus and in the mid       esophagus.      LA Grade C (one or more mucosal breaks continuous between tops of 2 or       more mucosal folds, less than 75% circumference) esophagitis with no       bleeding was found in the distal esophagus.      Retained fluid was found in the entire examined stomach. Suction via       Endoscope was performed and we removed 3200 mL of fluid.      Diffuse moderately erythematous mucosa without bleeding was found in the       entire examined stomach.      A dilation deformity was found in the entire examined stomach after       removal of the fluid contents.      An acquired severe stenosis was found at the apex of the duodenum in the       D1/D2 angulation and was non-traversed  after adult endoscope and even       neonatal endoscope (I had been able to pass this previously). Biopsies       were taken with a cold forceps for histology (though we have had 2 set       of biopsies that are negative at this time previously). Placement of a       long 0.035 inch Soft Jagwire was attempted. This passed successfully and       went into the distal duodenum/jejunum. Using an above extraction balloon       this followed the guidewire and I injected with iodinated contrast for       assessment of stricture length and noted the stricture to be at least 3       cm in length. Over the wire, a TTS dilator was passed through the scope.       Dilation with an 03-31-09 mm and a 06-03-11 mm pyloric balloon dilator was       performed under fluoroscopic guidance up to 12 mm. Impression:               - No gross lesions in esophagus. LA Grade C                            esophagitis with no bleeding.                           - Retained gastric fluid - suctioned via endoscope                            out 3200 mL.                           - Erythematous mucosa in the stomach.                           - Dilation deformity in the entire stomach due to  retained fluids.                           - Acquired duodenal stenosis. Biopsied. Dilated up                            to 12 mm. Moderate Sedation:      Not Applicable - Patient had care per Anesthesia. Recommendation:           - The patient will be observed post-procedure,                            until all discharge criteria are met.                           - Discharge patient to home.                           - Patient has a contact number available for                            emergencies. The signs and symptoms of potential                            delayed complications were discussed with the                            patient. Return to normal activities tomorrow.                             Written discharge instructions were provided to the                            patient.                           - Resume previous diet.                           - Continue present medications.                           - Await pathology results.                           - Repeat upper endoscopy in 1 week for retreatment.                           - I am willing to proceed with 1 more attempt at                            endoscopic therapy, however, the degree by which                            the patient has narrowed the region within such a  short period in time, the perisistence of                            inflammation, makes me concerned that this                            inflammatory process will not be amenable to                            endoscopic approach. I think it would be reasonable                            to consider surgical evaluation. We could arrange                            this as outpatient but if at next dilation, he                            still has significant narrowing, he may need to be                            admitted to the hospital for evaluation.                           - The findings and recommendations were discussed                            with the patient.                           - The findings and recommendations were discussed                            with the designated responsible adult. Procedure Code(s):        --- Professional ---                           [phone], Esophagogastroduodenoscopy, flexible,                            transoral; with dilation of gastric/duodenal                            stricture(s) (eg, balloon, bougie) Diagnosis Code(s):        --- Professional ---                           K20.90, Esophagitis, unspecified without bleeding                           K31.89, Other diseases of stomach and duodenum                           K31.5, Obstruction of duodenum CPT  copyright 2019 [employer] Medical [employer]. All rights reserved. The codes documented in  this report are preliminary and upon coder review may  be revised to meet current compliance requirements. Justice Britain, MD 04/03/2021 2:47:58 PM Number of Addenda: 0

## 2021-04-06 ENCOUNTER — Other Ambulatory Visit: Payer: Self-pay

## 2021-04-06 ENCOUNTER — Telehealth: Payer: Self-pay

## 2021-04-06 ENCOUNTER — Telehealth: Payer: Self-pay | Admitting: Gastroenterology

## 2021-04-06 ENCOUNTER — Encounter: Payer: Self-pay | Admitting: Gastroenterology

## 2021-04-06 DIAGNOSIS — K222 Esophageal obstruction: Secondary | ICD-10-CM

## 2021-04-06 LAB — SURGICAL PATHOLOGY

## 2021-04-06 NOTE — Telephone Encounter (Signed)
I spoke with the pt wife and she has been advised of the instructions.  Offered time for questions and asked her to call back with any further concerns.

## 2021-04-06 NOTE — Telephone Encounter (Signed)
Inbound call from wife states patient was told Friday 8/12 he would have another EGD Thursday 8/18 but nothing is currently scheduled and she was wondering when it would be scheduled.

## 2021-04-06 NOTE — Telephone Encounter (Signed)
-----   Message from Irving Copas., MD sent at 04/05/2021  8:09 PM EDT ----- Bryan Vargas, Please schedule this patient for EGD with fluoroscopy 1 hour Case on Thursday.  There is an ERCP that will be canceled (see prior notation). Please call patient/significant other and let them know that that is the date that we have available. FYI NR and RR, I am I am not sure endoscopic therapy is going to work for this patient's stricture/stenosis we certainly will try again but I suspect he is going to need a surgical management. Thanks to all. GM

## 2021-04-06 NOTE — Telephone Encounter (Signed)
See results note dated 8/15

## 2021-04-06 NOTE — Telephone Encounter (Signed)
The pt has been scheduled for 04/09/21 at [facility] at 730 am with GM.    EGD scheduled, pt instructed and medications reviewed.  Patient instructions mailed to home.  Patient to call with any questions or concerns.

## 2021-04-07 ENCOUNTER — Encounter (HOSPITAL_COMMUNITY): Payer: Self-pay | Admitting: Gastroenterology

## 2021-04-09 ENCOUNTER — Ambulatory Visit (HOSPITAL_COMMUNITY): Payer: Medicare Other | Admitting: Anesthesiology

## 2021-04-09 ENCOUNTER — Encounter (HOSPITAL_COMMUNITY): Payer: Self-pay | Admitting: Gastroenterology

## 2021-04-09 ENCOUNTER — Encounter (HOSPITAL_COMMUNITY)
Admission: RE | Disposition: A | Discharge status: Home / Self Care | Payer: Self-pay | Source: Home / Self Care | Attending: Gastroenterology

## 2021-04-09 ENCOUNTER — Other Ambulatory Visit: Payer: Self-pay

## 2021-04-09 ENCOUNTER — Telehealth: Payer: Self-pay

## 2021-04-09 ENCOUNTER — Ambulatory Visit (HOSPITAL_COMMUNITY): Payer: Medicare Other

## 2021-04-09 ENCOUNTER — Ambulatory Visit (HOSPITAL_COMMUNITY)
Admission: RE | Admit: 2021-04-09 | Discharge: 2021-04-09 | Disposition: A | Discharge status: Home / Self Care | Payer: Medicare Other | Attending: Gastroenterology | Admitting: Gastroenterology

## 2021-04-09 DIAGNOSIS — E119 Type 2 diabetes mellitus without complications: Secondary | ICD-10-CM | POA: Insufficient documentation

## 2021-04-09 DIAGNOSIS — K209 Esophagitis, unspecified without bleeding: Secondary | ICD-10-CM | POA: Diagnosis not present

## 2021-04-09 DIAGNOSIS — K3189 Other diseases of stomach and duodenum: Secondary | ICD-10-CM

## 2021-04-09 DIAGNOSIS — R110 Nausea: Secondary | ICD-10-CM | POA: Insufficient documentation

## 2021-04-09 DIAGNOSIS — K315 Obstruction of duodenum: Secondary | ICD-10-CM | POA: Diagnosis present

## 2021-04-09 DIAGNOSIS — R6881 Early satiety: Secondary | ICD-10-CM | POA: Diagnosis not present

## 2021-04-09 DIAGNOSIS — E46 Unspecified protein-calorie malnutrition: Secondary | ICD-10-CM | POA: Insufficient documentation

## 2021-04-09 DIAGNOSIS — Z8711 Personal history of peptic ulcer disease: Secondary | ICD-10-CM | POA: Insufficient documentation

## 2021-04-09 DIAGNOSIS — Z419 Encounter for procedure for purposes other than remedying health state, unspecified: Secondary | ICD-10-CM

## 2021-04-09 DIAGNOSIS — K222 Esophageal obstruction: Secondary | ICD-10-CM

## 2021-04-09 HISTORY — PX: FOREIGN BODY REMOVAL: SHX962

## 2021-04-09 HISTORY — PX: ESOPHAGOGASTRODUODENOSCOPY (EGD) WITH PROPOFOL: SHX5813

## 2021-04-09 HISTORY — PX: BALLOON DILATION: SHX5330

## 2021-04-09 LAB — GLUCOSE, CAPILLARY
Glucose-Capillary: 103 mg/dL — ABNORMAL HIGH (ref 70–99)
Glucose-Capillary: 120 mg/dL — ABNORMAL HIGH (ref 70–99)

## 2021-04-09 SURGERY — ESOPHAGOGASTRODUODENOSCOPY (EGD) WITH PROPOFOL
Anesthesia: General

## 2021-04-09 MED ORDER — LIDOCAINE 2% (20 MG/ML) 5 ML SYRINGE
INTRAMUSCULAR | Status: DC | PRN
  Administered 2021-04-09: 80 mg via INTRAVENOUS

## 2021-04-09 MED ORDER — DEXAMETHASONE SODIUM PHOSPHATE 4 MG/ML IJ SOLN
INTRAMUSCULAR | Status: DC | PRN
  Administered 2021-04-09: 5 mg via INTRAVENOUS

## 2021-04-09 MED ORDER — PHENYLEPHRINE 40 MCG/ML (10ML) SYRINGE FOR IV PUSH (FOR BLOOD PRESSURE SUPPORT)
PREFILLED_SYRINGE | INTRAVENOUS | Status: DC | PRN
  Administered 2021-04-09: 120 ug via INTRAVENOUS
  Administered 2021-04-09 (×2): 80 ug via INTRAVENOUS
  Administered 2021-04-09: 120 ug via INTRAVENOUS

## 2021-04-09 MED ORDER — SODIUM CHLORIDE 0.9 % IV SOLN
INTRAVENOUS | Status: DC | PRN
  Administered 2021-04-09: 20 mL

## 2021-04-09 MED ORDER — CIPROFLOXACIN IN D5W 400 MG/200ML IV SOLN
INTRAVENOUS | Status: DC | PRN
  Administered 2021-04-09: 400 mg via INTRAVENOUS

## 2021-04-09 MED ORDER — PHENYLEPHRINE HCL-NACL 20-0.9 MG/250ML-% IV SOLN
INTRAVENOUS | Status: DC | PRN
Start: 2021-04-09 — End: 2021-04-09
  Administered 2021-04-09: 50 ug/min via INTRAVENOUS

## 2021-04-09 MED ORDER — MIDAZOLAM HCL 5 MG/5ML IJ SOLN
INTRAMUSCULAR | Status: DC | PRN
  Administered 2021-04-09: 2 mg via INTRAVENOUS

## 2021-04-09 MED ORDER — FENTANYL CITRATE (PF) 100 MCG/2ML IJ SOLN
INTRAMUSCULAR | Status: DC | PRN
  Administered 2021-04-09: 50 ug via INTRAVENOUS

## 2021-04-09 MED ORDER — EPHEDRINE SULFATE-NACL 50-0.9 MG/10ML-% IV SOSY
PREFILLED_SYRINGE | INTRAVENOUS | Status: DC | PRN
  Administered 2021-04-09 (×2): 10 mg via INTRAVENOUS

## 2021-04-09 MED ORDER — SUCCINYLCHOLINE CHLORIDE 200 MG/10ML IV SOSY
PREFILLED_SYRINGE | INTRAVENOUS | Status: DC | PRN
  Administered 2021-04-09: 100 mg via INTRAVENOUS

## 2021-04-09 MED ORDER — PROPOFOL 10 MG/ML IV BOLUS
INTRAVENOUS | Status: DC | PRN
  Administered 2021-04-09: 140 mg via INTRAVENOUS

## 2021-04-09 MED ORDER — SODIUM CHLORIDE 0.9 % IV SOLN: INTRAVENOUS | Status: DC

## 2021-04-09 MED ORDER — CIPROFLOXACIN IN D5W 400 MG/200ML IV SOLN
INTRAVENOUS | Status: AC
  Filled 2021-04-09: qty 200

## 2021-04-09 MED ORDER — ONDANSETRON HCL 4 MG/2ML IJ SOLN
INTRAMUSCULAR | Status: DC | PRN
Start: 2021-04-09 — End: 2021-04-09
  Administered 2021-04-09: 4 mg via INTRAVENOUS

## 2021-04-09 MED ORDER — LACTATED RINGERS IV SOLN: INTRAVENOUS | Status: DC

## 2021-04-09 SURGICAL SUPPLY — 14 items

## 2021-04-09 NOTE — Transfer of Care (Signed)
Immediate Anesthesia Transfer of Care Note  Patient: Bryan Vargas  Procedure(s) Performed: ESOPHAGOGASTRODUODENOSCOPY (EGD) WITH PROPOFOL FOREIGN BODY REMOVAL BALLOON DILATION  Patient Location: PACU  Anesthesia Type:General  Level of Consciousness: awake  Airway & Oxygen Therapy: Patient Spontanous Breathing and Patient connected to face mask oxygen  Post-op Assessment: Report given to RN and Post -op Vital signs reviewed and stable  Post vital signs: Reviewed and stable  Last Vitals:  Vitals Value Taken Time  BP 146/78 04/09/21 0856  Temp 36.2 C 04/09/21 0855  Pulse 71 04/09/21 0857  Resp 18 04/09/21 0857  SpO2 100 % 04/09/21 0857  Vitals shown include unvalidated device data.  Last Pain:  Vitals:   04/09/21 0707  TempSrc: Oral  PainSc: 0-No pain         Complications: No notable events documented.

## 2021-04-09 NOTE — Anesthesia Procedure Notes (Signed)
Procedure Name: Intubation Date/Time: 04/09/2021 7:51 AM Performed by: Eulas Post, Byan Poplaski W, CRNA Pre-anesthesia Checklist: Patient identified, Emergency Drugs available, Suction available and Patient being monitored Patient Re-evaluated:Patient Re-evaluated prior to induction Oxygen Delivery Method: Circle system utilized Preoxygenation: Pre-oxygenation with 100% oxygen Induction Type: IV induction Ventilation: Mask ventilation without difficulty Laryngoscope Size: Miller and 2 Grade View: Grade I Tube type: Oral Tube size: 7.0 mm Number of attempts: 1 Airway Equipment and Method: Stylet Placement Confirmation: ETT inserted through vocal cords under direct vision, positive ETCO2 and breath sounds checked- equal and bilateral Secured at: 24 cm Tube secured with: Tape Dental Injury: Teeth and Oropharynx as per pre-operative assessment

## 2021-04-09 NOTE — Telephone Encounter (Signed)
Per VO from Dr Rush Landmark the pt needs referral to [facility] Dr Zenia Resides for duodenal stricture.  Referral placed and records faxed.

## 2021-04-09 NOTE — H&P (Signed)
GASTROENTEROLOGY PROCEDURE H&P NOTE   Primary Care Physician: Quentin Cornwall, MD  HPI: Bryan Vargas is a 70 y.o. male who presents for EGD with duodenal stricture.  Past Medical History:  Diagnosis Date   Arthritis    Coronary artery calcification seen on CAT scan 09/2020   Diabetes ([place])    Gout    Hepatitis    at age 70   Past Surgical History:  Procedure Laterality Date   arthritis     BALLOON DILATION N/A 03/02/2021   Procedure: BALLOON DILATION;  Surgeon: Daneil Dolin, MD;  Location: [facility];  Service: Gastroenterology;  Laterality: N/A;   BALLOON DILATION N/A 04/03/2021   Procedure: BALLOON DILATION;  Surgeon: Rush Landmark Telford Nab., MD;  Location: [facility];  Service: Gastroenterology;  Laterality: N/A;   BIOPSY  02/27/2021   Procedure: BIOPSY;  Surgeon: Rogene Houston, MD;  Location: [facility];  Service: Endoscopy;;   BIOPSY  03/02/2021   Procedure: BIOPSY;  Surgeon: Daneil Dolin, MD;  Location: [facility];  Service: Gastroenterology;;   BIOPSY  03/23/2021   Procedure: BIOPSY;  Surgeon: Irving Copas., MD;  Location: [facility];  Service: Gastroenterology;;   CYSTOSCOPY W/ URETERAL STENT PLACEMENT     ESOPHAGEAL DILATION N/A 03/18/2021   Procedure: ESOPHAGEAL DILATION;  Surgeon: Rogene Houston, MD;  Location: [facility];  Service: Endoscopy;  Laterality: N/A;   ESOPHAGOGASTRODUODENOSCOPY (EGD) WITH PROPOFOL N/A 02/27/2021   Procedure: ESOPHAGOGASTRODUODENOSCOPY (EGD) WITH PROPOFOL;  Surgeon: Rogene Houston, MD;  [facility]: [facility];  Service: Endoscopy;  Laterality: N/A;   ESOPHAGOGASTRODUODENOSCOPY (EGD) WITH PROPOFOL N/A 03/02/2021   Procedure: ESOPHAGOGASTRODUODENOSCOPY (EGD) WITH PROPOFOL;  Surgeon: Daneil Dolin, MD;  Location: [facility];  Service: Gastroenterology;  Laterality: N/A;  to be done in OR per anesthesia   ESOPHAGOGASTRODUODENOSCOPY (EGD) WITH PROPOFOL N/A 03/18/2021   Procedure: ESOPHAGOGASTRODUODENOSCOPY (EGD) WITH PROPOFOL;   Surgeon: Rogene Houston, MD;  Location: [facility];  Service: Endoscopy;  Laterality: N/A;  1:05   ESOPHAGOGASTRODUODENOSCOPY (EGD) WITH PROPOFOL N/A 03/23/2021   Procedure: ESOPHAGOGASTRODUODENOSCOPY (EGD) WITH PROPOFOL;  Surgeon: Rush Landmark Telford Nab., MD;  Location: [facility];  Service: Gastroenterology;  Laterality: N/A;   ESOPHAGOGASTRODUODENOSCOPY (EGD) WITH PROPOFOL N/A 04/03/2021   Procedure: ESOPHAGOGASTRODUODENOSCOPY (EGD) WITH PROPOFOL;  Surgeon: Rush Landmark Telford Nab., MD;  Location: [facility];  Service: Gastroenterology;  Laterality: N/A;   EXTRACORPOREAL SHOCK WAVE LITHOTRIPSY     EXTRACORPOREAL SHOCK WAVE LITHOTRIPSY     FOREIGN BODY REMOVAL  03/23/2021   Procedure: FOREIGN BODY REMOVAL;  Surgeon: Rush Landmark, Telford Nab., MD;  Location: [facility];  Service: Gastroenterology;;   IMPACTION REMOVAL  04/03/2021   Procedure: IMPACTION REMOVAL;  Surgeon: Irving Copas., MD;  Location: [facility];  Service: Gastroenterology;;   PROSTATE BIOPSY     TONSILLECTOMY     Current Facility-Administered Medications  Medication Dose Route Frequency Provider Last Rate Last Admin   0.9 %  sodium chloride infusion   Intravenous Continuous Mansouraty, Telford Nab., MD       lactated ringers infusion   Intravenous Continuous Mansouraty, Telford Nab., MD        Current Facility-Administered Medications:    0.9 %  sodium chloride infusion, , Intravenous, Continuous, Mansouraty, Telford Nab., MD   lactated ringers infusion, , Intravenous, Continuous, Mansouraty, Telford Nab., MD No Known Allergies Family History  Problem Relation Age of Onset   Cancer Brother        patient can't remember what kind  Colon cancer Neg Hx    Celiac disease Neg Hx    Inflammatory bowel disease Neg Hx    Social History   Socioeconomic History   Marital status: Single    Spouse name: Not on file   Number of children: 2   Years of education: Not on file   Highest education level: Not on file   Occupational History   Occupation: retired  Tobacco Use   Smoking status: Never   Smokeless tobacco: Never  Vaping Use   Vaping Use: Never used  Substance and Sexual Activity   Alcohol use: Not Currently   Drug use: Never   Sexual activity: Not on file  Other Topics Concern   Not on file  Social History Narrative   Not on file   Social Determinants of Health   Financial Resource Strain: Not on file  Food Insecurity: Not on file  Transportation Needs: Not on file  Physical Activity: Not on file  Stress: Not on file  Social Connections: Not on file  Intimate Partner Violence: Not on file    Physical Exam: Vital signs in last 24 hours: Temp:  [98.6 F (37 C)] 98.6 F (37 C) (08/18 0707) Resp:  [15] 15 (08/18 0707) BP: (135)/(86) 135/86 (08/18 0707) SpO2:  [97 %] 97 % (08/18 0707) Weight:  [99.8 kg] 99.8 kg (08/18 0707)   GEN: NAD EYE: Sclerae anicteric ENT: MMM CV: Non-tachycardic GI: Soft, NT/ND NEURO:  Alert & Oriented x 3  Lab Results: No results for input(s): WBC, HGB, HCT, PLT in the last 72 hours. BMET No results for input(s): NA, K, CL, CO2, GLUCOSE, BUN, CREATININE, CALCIUM in the last 72 hours. LFT No results for input(s): PROT, ALBUMIN, AST, ALT, ALKPHOS, BILITOT, BILIDIR, IBILI in the last 72 hours. PT/INR No results for input(s): LABPROT, INR in the last 72 hours.   Impression / Plan: This is a 70 y.o.male who presents for EGD with duodenal stricture.  The risks and benefits of endoscopic evaluation/treatment were discussed with the patient and/or family; these include but are not limited to the risk of perforation, infection, bleeding, missed lesions, lack of diagnosis, severe illness requiring hospitalization, as well as anesthesia and sedation related illnesses.  The patient's history has been reviewed, patient examined, no change in status, and deemed stable for procedure.  The patient and/or family is agreeable to proceed.    Justice Britain, MD [facility] Office # [phone]

## 2021-04-09 NOTE — Anesthesia Postprocedure Evaluation (Signed)
Anesthesia Post Note  Patient: Bryan Vargas  Procedure(s) Performed: ESOPHAGOGASTRODUODENOSCOPY (EGD) WITH PROPOFOL FOREIGN BODY REMOVAL BALLOON DILATION     Patient location during evaluation: Endoscopy Anesthesia Type: General Level of consciousness: awake and alert, awake and oriented Pain management: pain level controlled Vital Signs Assessment: post-procedure vital signs reviewed and stable Respiratory status: spontaneous breathing, nonlabored ventilation, respiratory function stable and patient connected to nasal cannula oxygen Cardiovascular status: blood pressure returned to baseline and stable Postop Assessment: no apparent nausea or vomiting Anesthetic complications: no   No notable events documented.  Last Vitals:  Vitals:   04/09/21 0910 04/09/21 0925  BP: 127/74 133/76  Pulse: 79 70  Resp: 17 13  Temp:  (!) 36.3 C  SpO2: 98% 100%    Last Pain:  Vitals:   04/09/21 0925  TempSrc:   PainSc: 0-No pain                 Catalina Gravel

## 2021-04-09 NOTE — Op Note (Signed)
[facility] Patient Name: Bryan Vargas Procedure Date : 04/09/2021 MRN: [id number] Attending MD: Justice Britain , MD Date of Birth: March 20, 1951 CSN: [id number] Age: 70 Admit Type: Outpatient Procedure:                Upper GI endoscopy Indications:              Stenosis of the duodenum, For therapy of duodenal                            stenosis, Early satiety, Malnutrition, Nausea,                            Weight loss, Personal history of peptic ulcer                            disease Providers:                Justice Britain, MD, Kary Kos RN, RN,                            Laverda Sorenson, Technician Referring MD:             Hildred Laser, MD, Norvel Richards, MD, Quentin Cornwall Medicines:                General Anesthesia Complications:            No immediate complications. Estimated Blood Loss:     Estimated blood loss was minimal. Procedure:                Pre-Anesthesia Assessment:                           - Prior to the procedure, a History and Physical                            was performed, and patient medications and                            allergies were reviewed. The patient's tolerance of                            previous anesthesia was also reviewed. The risks                            and benefits of the procedure and the sedation                            options and risks were discussed with the patient.                            All questions were answered, and informed consent  was obtained. Prior Anticoagulants: The patient has                            taken no previous anticoagulant or antiplatelet                            agents. ASA Grade Assessment: III - A patient with                            severe systemic disease. After reviewing the risks                            and benefits, the patient was deemed in                            satisfactory condition  to undergo the procedure.                           After obtaining informed consent, the endoscope was                            passed under direct vision. Throughout the                            procedure, the patient's blood pressure, pulse, and                            oxygen saturations were monitored continuously. The                            [id number]-[id number] ([id number] Olympus endoscope was                            introduced through the mouth, and advanced to the                            duodenal bulb. The upper GI endoscopy was                            accomplished without difficulty. The patient                            tolerated the procedure. Scope In: Scope Out: Findings:      No gross lesions were noted in the proximal esophagus and in the mid       esophagus.      LA Grade C (one or more mucosal breaks continuous between tops of 2 or       more mucosal folds, less than 75% circumference) esophagitis with no       bleeding was found in the distal esophagus.      Retained fluid was found in the entire examined stomach. Suction via       Endoscope was performed - I removed 4100 mL of fluid today.      A dilation deformity was found of the entire examined stomach.  Patchy mildly erythematous mucosa without bleeding was found in the       entire examined stomach - likely from stasis.      Food (residue) was found in the duodenal bulb. Suction via Endoscope was       performed and removal via rothnet was performed..      An acquired severe stenosis was found in the apex of the duodenal bulb       going into the D1/D2 angle and was non-traversed. Placement of a short       0.035 inch Soft Jagwire was attempted to be placed into distal duodenum       and this passed successfully. Using an extraction above-balloon, I       injected with iodinated contrast for lesion assessment and determined       the stricture to be approximately 3-4 cm in length. A TTS dilator was        passed through the scope. Dilation with a 06-03-11 mm pyloric balloon       dilator was performed under fluoroscopic guidance up to 12 mm.       Appropriate mucosal wrenting was noted, and it was not felt safe to go       further without continued risk of perforation. Impression:               - No gross lesions in esophagus proximally. LA                            Grade C esophagitis with no bleeding distally.                           - Retained gastric fluid - suctioned out 4100 mL                            today.                           - Dilation deformity of the entire stomach.                           - Erythematous mucosa in the stomach.                           - Retained food in the duodenum - removed.                           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 Dilated with appropriate mucosal                            wrenting. Recommendation:           - The patient will be observed post-procedure,                            until all discharge criteria are met.                           -  Disposition to be determined.                           - Resume previous diet.                           - Observe patient's clinical course.                           - At this point, within 1 week his narrowing has                            decreased in size to 6 mm and required repeat                            dilation to 12 mm. He had >4L of retained fluid in                            stomach. I have done 3 endoscopic dilations and I                            do not think that endoscopic therapy will be enough                            for this patient and consideration of bypass                            gastrojejunostomy should be had. I will discuss                            this again with patient and his significant other                            for [place] to see how we can work though and try to do                             this as an outpatient vs bringing patient into the                            hospital for surgical evaluation.                           - Observe patient's clinical course.                           - If patient is going to try to have this done as                            an outpatient, I will try to bring him back in for                            dilation within the next 1-2 weeks or ask  our                            colleagues for help due to my scheduling.                           - The findings and recommendations were discussed                            with the patient.                           - The findings and recommendations were discussed                            with the designated responsible adult. Procedure Code(s):        --- Professional ---                           [phone], Esophagogastroduodenoscopy, flexible,                            transoral; with dilation of gastric/duodenal                            stricture(s) (eg, balloon, bougie) Diagnosis Code(s):        --- Professional ---                           K20.90, Esophagitis, unspecified without bleeding                           K31.89, Other diseases of stomach and duodenum                           K31.5, Obstruction of duodenum                           R68.81, Early satiety                           E46, Unspecified protein-calorie malnutrition                           R11.0, Nausea                           R63.4, Abnormal weight loss                           Z87.11, Personal history of peptic ulcer disease CPT copyright 2019 [employer]. All rights reserved. The codes documented in this report are preliminary and upon coder review may  be revised to meet current compliance requirements. Justice Britain, MD 04/09/2021 8:59:42 AM Number of Addenda: 0

## 2021-04-09 NOTE — Anesthesia Preprocedure Evaluation (Signed)
Anesthesia Evaluation  Patient identified by MRN, date of birth, ID band Patient awake    Reviewed: Allergy & Precautions, NPO status , Patient's Chart, lab work & pertinent test results  Airway Mallampati: III  TM Distance: >3 FB Neck ROM: Full    Dental  (+) Teeth Intact, Dental Advisory Given   Pulmonary neg pulmonary ROS,    Pulmonary exam normal breath sounds clear to auscultation       Cardiovascular + CAD  Normal cardiovascular exam Rhythm:Regular Rate:Normal     Neuro/Psych negative neurological ROS  negative psych ROS   GI/Hepatic Neg liver ROS, PUD, GERD  Medicated,esophageal stricture   Endo/Other  diabetes, Type 2, Oral Hypoglycemic Agents  Renal/GU negative Renal ROS     Musculoskeletal  (+) Arthritis ,   Abdominal   Peds  Hematology negative hematology ROS (+)   Anesthesia Other Findings Day of surgery medications reviewed with the patient.  Reproductive/Obstetrics                             Anesthesia Physical Anesthesia Plan  ASA: 3  Anesthesia Plan: MAC   Post-op Pain Management:    Induction: Intravenous  PONV Risk Score and Plan: 1 and Propofol infusion and Treatment may vary due to age or medical condition  Airway Management Planned: Nasal Cannula and Natural Airway  Additional Equipment:   Intra-op Plan:   Post-operative Plan:   Informed Consent: I have reviewed the patients History and Physical, chart, labs and discussed the procedure including the risks, benefits and alternatives for the proposed anesthesia with the patient or authorized representative who has indicated his/her understanding and acceptance.     Dental advisory given  Plan Discussed with: CRNA and Anesthesiologist  Anesthesia Plan Comments:         Anesthesia Quick Evaluation

## 2021-04-10 ENCOUNTER — Encounter (HOSPITAL_COMMUNITY): Payer: Self-pay | Admitting: Gastroenterology

## 2021-04-14 ENCOUNTER — Telehealth: Payer: Self-pay

## 2021-04-14 NOTE — Telephone Encounter (Signed)
-----   Message from Irving Copas., MD sent at 04/14/2021  7:49 AM EDT ----- Regarding: Repeat EGD with dilation Zyiere Rosemond, Please schedule this patient for repeat EGD with duodenal stricture dilation on 8/30 AM at 730.  It will be at [facility].  I have already had a place saved for him and gotten approval from the Endoscopy team. If this date does not work for patient, please let him know it will be a few weeks. We should be hearing about his visit with the surgeons that have been scheduled for this week. Thanks. GM

## 2021-04-15 ENCOUNTER — Other Ambulatory Visit: Payer: Self-pay

## 2021-04-15 DIAGNOSIS — K315 Obstruction of duodenum: Secondary | ICD-10-CM

## 2021-04-15 DIAGNOSIS — K222 Esophageal obstruction: Secondary | ICD-10-CM

## 2021-04-15 NOTE — Telephone Encounter (Signed)
EGD scheduled at [facility] on 8/30 at 730 am with GM

## 2021-04-15 NOTE — Telephone Encounter (Signed)
Dr Rush Landmark I have the pt set up for EGD but when I called to give the pt his instructions they tell me that he was seen by a surgeon and will not need the EGD at this time.  They would like a call to discuss.

## 2021-04-16 ENCOUNTER — Ambulatory Visit: Payer: Self-pay | Admitting: Surgery

## 2021-04-16 ENCOUNTER — Other Ambulatory Visit: Payer: Self-pay

## 2021-04-16 ENCOUNTER — Encounter (HOSPITAL_COMMUNITY): Payer: Self-pay | Admitting: Surgery

## 2021-04-16 DIAGNOSIS — K315 Obstruction of duodenum: Secondary | ICD-10-CM

## 2021-04-16 NOTE — Telephone Encounter (Signed)
FYI

## 2021-04-16 NOTE — H&P (Signed)
History of Present Illness: Bryan Vargas is a 70 y.o. 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 The patient began having weight loss in December of 2021, and has lost a total of 50 pounds since then. In June he started feeling constipated, and shortly after that began feeling a pressure in his upper abdomen with reflux symptoms. This worsened after a large meal and he went to the ED at [facility] on 7/6. He was found to have an AKI and dehydration and was admitted. A CT scan showed marked gastric distension. He underwent an EGD on 7/8, which showed ulcers in the distal stomach and a stricture of the proximal duodenum at D1/D2. Gastric biopsies were benign. He then had an EGD  on 7/11 with on of the duodenal stricture.  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  He also had retained fluid within the stomach.  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 He was started on PPI therapy.  He was discharged home and underwent a repeat EGD on 7/27, which again showed retained fluid in the stomach.  His previous gastric ulcers had healed.  His duodenal stenosis at that time was 4 mm in diameter and repeat dilation was not attempted.  He was referred to Dr. Mansouraty, who performed an EGD with balloon dilation on 8/1.  Duodenal biopsies at that time again showed peptic duodenitis but no malignancy or dysplasia.  He underwent repeat balloon dilations on 8/12 and 8/18, with duodenal biopsies on 8-12 which were again benign.  At all of his dilations he had retained fluid in his stomach at a volume of 3 to 4 L.  He reportedly declined admission after his last EGD and was referred to me today for surgical management of his gastric outlet obstruction.   He remains on a liquid diet, and denies any vomiting.  He does  have fullness and pressure in his upper abdomen.  He has never had any prior abdominal surgeries.     Review of Systems: A complete review of systems was obtained from the patient.  I have reviewed this information and discussed as appropriate with the patient.  See HPI as well for other ROS.       Medical History: Past Medical History      Past Medical History:  Diagnosis Date   Arthritis     Diabetes mellitus without complication (CMS-HCC)             Patient Active Problem List  Diagnosis   Gastric outlet obstruction   Duodenal stricture      Past Surgical History       Past Surgical History:  Procedure Laterality Date   TONSILLECTOMY   1963        Allergies  No Known Allergies           Current Outpatient Medications on File Prior to Visit  Medication Sig Dispense Refill   aspirin 81 MG EC tablet Take by mouth       azelastine (ASTELIN) 137 mcg nasal spray Place 1 spray into one nostril 2 (two) times daily as needed       azelastine-fluticasone 137-50 mcg/spray Spry         latanoprost (XALATAN) 0.005 % ophthalmic solution Apply 1 drop to eye at bedtime       pantoprazole (  PROTONIX) 40 MG DR tablet Take 1 tablet by mouth 2 (two) times daily       sucralfate (CARAFATE) 100 mg/mL suspension Take by mouth       tamsulosin (FLOMAX) 0.4 mg capsule Take by mouth       finasteride (PROSCAR) 5 mg tablet         JANUVIA 100 mg tablet          No current facility-administered medications on file prior to visit.      Family History  No family history on file.      Social History       Tobacco Use  Smoking Status Never Smoker  Smokeless Tobacco Never Used      Social History  Social History        Socioeconomic History   Marital status: Single  Tobacco Use   Smoking status: Never Smoker   Smokeless tobacco: Never Used  Vaping Use   Vaping Use: Never used  Substance and Sexual Activity   Alcohol use: Yes   Drug use: Defer   Sexual activity: Defer         Objective:         Vitals:    04/15/21 0951  BP: 110/70  Pulse: 76  Temp: 36.8 C (98.2 F)  SpO2: 99%  Weight: 99.8 kg (220 lb)  Height: 188 cm (6' 2")    Body mass index is 28.25 kg/m.   Physical Exam Vitals reviewed.  Constitutional:      General: He is not in acute distress.    Appearance: Normal appearance.  HENT:     Head: Normocephalic and atraumatic.  Eyes:     General: No scleral icterus.    Conjunctiva/sclera: Conjunctivae normal.  Cardiovascular:     Rate and Rhythm: Normal rate and regular rhythm.     Heart sounds: No murmur heard. Pulmonary:     Effort: Pulmonary effort is normal. No respiratory distress.     Breath sounds: Normal breath sounds.  Abdominal:     Palpations: Abdomen is soft.     Tenderness: There is no abdominal tenderness.     Comments: Upper abdomen is mildly distended but diffusely soft and nontender to palpation.  There are no surgical scars.  Musculoskeletal:        General: Normal range of motion.     Cervical back: Normal range of motion.  Skin:    General: Skin is warm and dry.  Neurological:     General: No focal deficit present.     Mental Status: He is alert and oriented to person, place, and time.  Psychiatric:        Mood and Affect: Mood normal.        Behavior: Behavior normal.        Thought Content: Thought content normal.            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  The stricture appears benign based on multiple biopsies suggesting peptic duodenitis.  His peptic ulcers have resolved with PPI therapy.  I have personally reviewed his recent   CT scans, which show marked gastric  distention.  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  There were some small esophageal varices on previous EGD, but I do not see any large venous collaterals within the abdomen to suggest significant portal hypertension.  Since the patient has failed multiple endoscopic dilations of his stricture, I recommend surgical treatment of his gastric outlet obstruction via a GJ bypass.  I will plan to do this via an open approach.  This willl be scheduled urgently hopefully within the next 2 days.  I had a long discussion with the patient and his wife, and I discussed that given the size of his stomach and the duration of his symptoms for over a month now, his GJ anastomosis will likely take time to function properly.  I will thus also plan to place a feeding jejunostomy tube at the time of his surgery so that he will have enteral feeding access.  I will also check nutrition labs today and if his prealbumin is low he will be admitted for several days of TPN prior to surgery which I would then delay until next week.  Although he is able to drink liquids and is not vomiting, his actual absorption is likely minimal given that he is retaining large volumes of fluid on all of his EGDs, and given his weight loss over the last few months I suspect his nutritional status is currently very poor.  He will get labs drawn today and I will contact him with the results and the final plan.     Sai Zinn LYNN Fannie Alomar, MD      04/16/22: Labs reviewed. Albumin and prealbumin are normal. Renal function and electrolytes are within normal limits. I called the patient and discussed these results with him. Surgery scheduled for Monday am 8/29 at 7:30am.  

## 2021-04-16 NOTE — H&P (View-Only) (Signed)
History of Present Illness: Bryan Vargas is a 69 y.o. 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 The patient began having weight loss in December of 2021, and has lost a total of 50 pounds since then. In June he started feeling constipated, and shortly after that began feeling a pressure in his upper abdomen with reflux symptoms. This worsened after a large meal and he went to the ED at [facility] on 7/6. He was found to have an AKI and dehydration and was admitted. A CT scan showed marked gastric distension. He underwent an EGD on 7/8, which showed ulcers in the distal stomach and a stricture of the proximal duodenum at D1/D2. Gastric biopsies were benign. He then had an EGD  on 7/11 with on of the duodenal stricture.  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  He also had retained fluid within the stomach.  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 He was started on PPI therapy.  He was discharged home and underwent a repeat EGD on 7/27, which again showed retained fluid in the stomach.  His previous gastric ulcers had healed.  His duodenal stenosis at that time was 4 mm in diameter and repeat dilation was not attempted.  He was referred to Dr. Mansouraty, who performed an EGD with balloon dilation on 8/1.  Duodenal biopsies at that time again showed peptic duodenitis but no malignancy or dysplasia.  He underwent repeat balloon dilations on 8/12 and 8/18, with duodenal biopsies on 8-12 which were again benign.  At all of his dilations he had retained fluid in his stomach at a volume of 3 to 4 L.  He reportedly declined admission after his last EGD and was referred to me today for surgical management of his gastric outlet obstruction.   He remains on a liquid diet, and denies any vomiting.  He does  have fullness and pressure in his upper abdomen.  He has never had any prior abdominal surgeries.     Review of Systems: A complete review of systems was obtained from the patient.  I have reviewed this information and discussed as appropriate with the patient.  See HPI as well for other ROS.       Medical History: Past Medical History      Past Medical History:  Diagnosis Date   Arthritis     Diabetes mellitus without complication (CMS-HCC)             Patient Active Problem List  Diagnosis   Gastric outlet obstruction   Duodenal stricture      Past Surgical History       Past Surgical History:  Procedure Laterality Date   TONSILLECTOMY   1963        Allergies  No Known Allergies           Current Outpatient Medications on File Prior to Visit  Medication Sig Dispense Refill   aspirin 81 MG EC tablet Take by mouth       azelastine (ASTELIN) 137 mcg nasal spray Place 1 spray into one nostril 2 (two) times daily as needed       azelastine-fluticasone 137-50 mcg/spray Spry         latanoprost (XALATAN) 0.005 % ophthalmic solution Apply 1 drop to eye at bedtime       pantoprazole (  PROTONIX) 40 MG DR tablet Take 1 tablet by mouth 2 (two) times daily       sucralfate (CARAFATE) 100 mg/mL suspension Take by mouth       tamsulosin (FLOMAX) 0.4 mg capsule Take by mouth       finasteride (PROSCAR) 5 mg tablet         JANUVIA 100 mg tablet          No current facility-administered medications on file prior to visit.      Family History  No family history on file.      Social History       Tobacco Use  Smoking Status Never Smoker  Smokeless Tobacco Never Used      Social History  Social History        Socioeconomic History   Marital status: Single  Tobacco Use   Smoking status: Never Smoker   Smokeless tobacco: Never Used  Vaping Use   Vaping Use: Never used  Substance and Sexual Activity   Alcohol use: Yes   Drug use: Defer   Sexual activity: Defer         Objective:         Vitals:    04/15/21 0951  BP: 110/70  Pulse: 76  Temp: 36.8 C (98.2 F)  SpO2: 99%  Weight: 99.8 kg (220 lb)  Height: 188 cm (6' 2")    Body mass index is 28.25 kg/m.   Physical Exam Vitals reviewed.  Constitutional:      General: He is not in acute distress.    Appearance: Normal appearance.  HENT:     Head: Normocephalic and atraumatic.  Eyes:     General: No scleral icterus.    Conjunctiva/sclera: Conjunctivae normal.  Cardiovascular:     Rate and Rhythm: Normal rate and regular rhythm.     Heart sounds: No murmur heard. Pulmonary:     Effort: Pulmonary effort is normal. No respiratory distress.     Breath sounds: Normal breath sounds.  Abdominal:     Palpations: Abdomen is soft.     Tenderness: There is no abdominal tenderness.     Comments: Upper abdomen is mildly distended but diffusely soft and nontender to palpation.  There are no surgical scars.  Musculoskeletal:        General: Normal range of motion.     Cervical back: Normal range of motion.  Skin:    General: Skin is warm and dry.  Neurological:     General: No focal deficit present.     Mental Status: He is alert and oriented to person, place, and time.  Psychiatric:        Mood and Affect: Mood normal.        Behavior: Behavior normal.        Thought Content: Thought content normal.            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  The stricture appears benign based on multiple biopsies suggesting peptic duodenitis.  His peptic ulcers have resolved with PPI therapy.  I have personally reviewed his recent   CT scans, which show marked gastric  distention.  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  There were some small esophageal varices on previous EGD, but I do not see any large venous collaterals within the abdomen to suggest significant portal hypertension.  Since the patient has failed multiple endoscopic dilations of his stricture, I recommend surgical treatment of his gastric outlet obstruction via a GJ bypass.  I will plan to do this via an open approach.  This willl be scheduled urgently hopefully within the next 2 days.  I had a long discussion with the patient and his wife, and I discussed that given the size of his stomach and the duration of his symptoms for over a month now, his GJ anastomosis will likely take time to function properly.  I will thus also plan to place a feeding jejunostomy tube at the time of his surgery so that he will have enteral feeding access.  I will also check nutrition labs today and if his prealbumin is low he will be admitted for several days of TPN prior to surgery which I would then delay until next week.  Although he is able to drink liquids and is not vomiting, his actual absorption is likely minimal given that he is retaining large volumes of fluid on all of his EGDs, and given his weight loss over the last few months I suspect his nutritional status is currently very poor.  He will get labs drawn today and I will contact him with the results and the final plan.     SHELBY LYNN ALLEN, MD      04/16/22: Labs reviewed. Albumin and prealbumin are normal. Renal function and electrolytes are within normal limits. I called the patient and discussed these results with him. Surgery scheduled for Monday am 8/29 at 7:30am.  

## 2021-04-16 NOTE — Telephone Encounter (Signed)
Patty, I am reaching out to Dr. Zenia Resides whom he had seen to get some updates as to her thoughts on timing of potential surgical interventions. I will reach out to them once I have spoken with her. Just keeping the time slot for now. Thanks. GM

## 2021-04-16 NOTE — Progress Notes (Signed)
Bryan Vargas denies chest pain or shortness of breath. Patient denies having any s/s of Covid in his household.  Patient denies any known exposure to Covid.   Bryan Vargas has type II diabetes, patient reports that Bryan Vargas run around 49, Bryan Vargas, Patient 's significant other stated that last A1C was 7/4 in May of this year.I instructed Bryan Vargas to not take [place] on Monday am. I instructed patient to check CBG after awaking and every 2 hours until arrival  to the hospital.  I Instructed patient if CBG is less than 70 to take 4 Glucose Tablets or 1 tube of Glucose Gel or 1/2 cup of apple juice. Recheck CBG in 15 minutes if CBG is not over 70 call, pre- op desk at [phone] for further instructions.    PCP Dr.James Miliam, in [place]. I will request records. Bryan Vargas does not have a cardiologist.  Bryan Vargas reports that is does not have a colon prep. I will send a staff message to Dr. Zenia Resides and ask her to respond to Bryan Pro, RN.

## 2021-04-16 NOTE — Telephone Encounter (Signed)
The case has been cancelled and pt aware

## 2021-04-16 NOTE — Telephone Encounter (Signed)
Called and spoke with patient. I also spoke with Dr. Zenia Resides. She is planning early surgery within the next week. Please cancel the EGD scheduled for next Tuesday. Thanks. GM

## 2021-04-17 ENCOUNTER — Other Ambulatory Visit: Payer: Self-pay | Admitting: Gastroenterology

## 2021-04-17 NOTE — Progress Notes (Signed)
Dr. Zenia Resides messaged me this AM stating pt is not having colon surgery and does not need a prep kit.

## 2021-04-18 LAB — SARS CORONAVIRUS 2 (TAT 6-24 HRS): SARS Coronavirus 2: NEGATIVE

## 2021-04-20 ENCOUNTER — Encounter (HOSPITAL_COMMUNITY)
Admission: RE | Disposition: A | Discharge status: Home Health | Payer: Self-pay | Source: Home / Self Care | Attending: Surgery

## 2021-04-20 ENCOUNTER — Inpatient Hospital Stay (HOSPITAL_COMMUNITY): Payer: Medicare Other | Admitting: Anesthesiology

## 2021-04-20 ENCOUNTER — Inpatient Hospital Stay (HOSPITAL_COMMUNITY)
Admission: RE | Admit: 2021-04-20 | Discharge: 2021-04-30 | DRG: 327 | Disposition: A | Discharge status: Home Health | Payer: Medicare Other | Attending: Surgery | Admitting: Surgery

## 2021-04-20 DIAGNOSIS — Z79899 Other long term (current) drug therapy: Secondary | ICD-10-CM

## 2021-04-20 DIAGNOSIS — K315 Obstruction of duodenum: Secondary | ICD-10-CM | POA: Diagnosis present

## 2021-04-20 DIAGNOSIS — K311 Adult hypertrophic pyloric stenosis: Secondary | ICD-10-CM | POA: Diagnosis present

## 2021-04-20 DIAGNOSIS — E440 Moderate protein-calorie malnutrition: Secondary | ICD-10-CM | POA: Insufficient documentation

## 2021-04-20 DIAGNOSIS — K259 Gastric ulcer, unspecified as acute or chronic, without hemorrhage or perforation: Secondary | ICD-10-CM | POA: Diagnosis present

## 2021-04-20 DIAGNOSIS — Z7984 Long term (current) use of oral hypoglycemic drugs: Secondary | ICD-10-CM

## 2021-04-20 DIAGNOSIS — Z7982 Long term (current) use of aspirin: Secondary | ICD-10-CM | POA: Diagnosis not present

## 2021-04-20 DIAGNOSIS — Z8711 Personal history of peptic ulcer disease: Secondary | ICD-10-CM | POA: Diagnosis not present

## 2021-04-20 DIAGNOSIS — I8500 Esophageal varices without bleeding: Secondary | ICD-10-CM | POA: Diagnosis present

## 2021-04-20 DIAGNOSIS — K2980 Duodenitis without bleeding: Secondary | ICD-10-CM | POA: Diagnosis present

## 2021-04-20 DIAGNOSIS — E1165 Type 2 diabetes mellitus with hyperglycemia: Secondary | ICD-10-CM | POA: Diagnosis present

## 2021-04-20 HISTORY — DX: Unspecified asthma, uncomplicated: J45.909

## 2021-04-20 HISTORY — DX: Gastro-esophageal reflux disease without esophagitis: K21.9

## 2021-04-20 HISTORY — DX: Personal history of urinary calculi: Z87.442

## 2021-04-20 HISTORY — PX: GASTROJEJUNOSTOMY: SHX1697

## 2021-04-20 LAB — GLUCOSE, CAPILLARY
Glucose-Capillary: 128 mg/dL — ABNORMAL HIGH (ref 70–99)
Glucose-Capillary: 148 mg/dL — ABNORMAL HIGH (ref 70–99)
Glucose-Capillary: 150 mg/dL — ABNORMAL HIGH (ref 70–99)
Glucose-Capillary: 152 mg/dL — ABNORMAL HIGH (ref 70–99)
Glucose-Capillary: 196 mg/dL — ABNORMAL HIGH (ref 70–99)
Glucose-Capillary: 95 mg/dL (ref 70–99)

## 2021-04-20 LAB — BASIC METABOLIC PANEL
Anion gap: 5 (ref 5–15)
BUN: 9 mg/dL (ref 8–23)
CO2: 28 mmol/L (ref 22–32)
Calcium: 9.4 mg/dL (ref 8.9–10.3)
Chloride: 107 mmol/L (ref 98–111)
Creatinine, Ser: 1.15 mg/dL (ref 0.61–1.24)
GFR, Estimated: >60 mL/min (ref >60)
Glucose, Bld: 96 mg/dL (ref 70–99)
Potassium: 4.7 mmol/L (ref 3.5–5.1)
Sodium: 140 mmol/L (ref 135–145)

## 2021-04-20 LAB — CBC
HCT: 39.1 % (ref 39.0–52.0)
Hemoglobin: 12.5 g/dL — ABNORMAL LOW (ref 13.0–17.0)
MCH: 30.9 pg (ref 26.0–34.0)
MCHC: 32 g/dL (ref 30.0–36.0)
MCV: 96.5 fL (ref 80.0–100.0)
Platelets: 146 10*3/uL — ABNORMAL LOW (ref 150–400)
RBC: 4.05 MIL/uL — ABNORMAL LOW (ref 4.22–5.81)
RDW: 13.1 % (ref 11.5–15.5)
WBC: 6 10*3/uL (ref 4.0–10.5)
nRBC: 0 % (ref 0.0–0.2)

## 2021-04-20 SURGERY — GASTROJEJUNOSTOMY
Anesthesia: General | Site: Abdomen

## 2021-04-20 MED ORDER — METOPROLOL TARTRATE 5 MG/5ML IV SOLN
INTRAVENOUS | Status: AC
  Filled 2021-04-20: qty 5

## 2021-04-20 MED ORDER — ENOXAPARIN SODIUM 40 MG/0.4ML IJ SOSY
40.0000 mg | PREFILLED_SYRINGE | INTRAMUSCULAR | Status: DC
  Administered 2021-04-21 – 2021-04-30 (×10): 40 mg via SUBCUTANEOUS
  Filled 2021-04-20 (×10): qty 0.4

## 2021-04-20 MED ORDER — CHLORHEXIDINE GLUCONATE 0.12 % MT SOLN
OROMUCOSAL | Status: AC
  Administered 2021-04-20: 15 mL via OROMUCOSAL
  Filled 2021-04-20: qty 15

## 2021-04-20 MED ORDER — PHENYLEPHRINE 40 MCG/ML (10ML) SYRINGE FOR IV PUSH (FOR BLOOD PRESSURE SUPPORT)
PREFILLED_SYRINGE | INTRAVENOUS | Status: AC
  Filled 2021-04-20: qty 10

## 2021-04-20 MED ORDER — LIDOCAINE HCL (CARDIAC) PF 100 MG/5ML IV SOSY
PREFILLED_SYRINGE | INTRAVENOUS | Status: DC | PRN
  Administered 2021-04-20: 60 mg via INTRAVENOUS

## 2021-04-20 MED ORDER — LIDOCAINE 2% (20 MG/ML) 5 ML SYRINGE
INTRAMUSCULAR | Status: AC
  Filled 2021-04-20: qty 5

## 2021-04-20 MED ORDER — CHLORHEXIDINE GLUCONATE CLOTH 2 % EX PADS: 6.0000 | MEDICATED_PAD | Freq: Once | CUTANEOUS | Status: DC

## 2021-04-20 MED ORDER — DEXAMETHASONE SODIUM PHOSPHATE 10 MG/ML IJ SOLN
INTRAMUSCULAR | Status: AC
  Filled 2021-04-20: qty 1

## 2021-04-20 MED ORDER — DIPHENHYDRAMINE HCL 50 MG/ML IJ SOLN
INTRAMUSCULAR | Status: AC
  Filled 2021-04-20: qty 1

## 2021-04-20 MED ORDER — AMISULPRIDE (ANTIEMETIC) 5 MG/2ML IV SOLN: 10.0000 mg | Freq: Once | INTRAVENOUS | Status: DC | PRN

## 2021-04-20 MED ORDER — ROCURONIUM BROMIDE 100 MG/10ML IV SOLN
INTRAVENOUS | Status: DC | PRN
  Administered 2021-04-20: 60 mg via INTRAVENOUS

## 2021-04-20 MED ORDER — ACETAMINOPHEN 10 MG/ML IV SOLN
INTRAVENOUS | Status: AC
  Filled 2021-04-20: qty 100

## 2021-04-20 MED ORDER — ONDANSETRON HCL 4 MG/2ML IJ SOLN
INTRAMUSCULAR | Status: DC | PRN
  Administered 2021-04-20: 4 mg via INTRAVENOUS

## 2021-04-20 MED ORDER — METRONIDAZOLE 500 MG/100ML IV SOLN
INTRAVENOUS | Status: AC
  Filled 2021-04-20: qty 100

## 2021-04-20 MED ORDER — METHOCARBAMOL 1000 MG/10ML IJ SOLN
500.0000 mg | Freq: Three times a day (TID) | INTRAVENOUS | Status: DC
  Administered 2021-04-20 – 2021-04-25 (×15): 500 mg via INTRAVENOUS
  Filled 2021-04-20: qty 500
  Filled 2021-04-20 (×2): qty 5
  Filled 2021-04-20: qty 500
  Filled 2021-04-20: qty 5
  Filled 2021-04-20: qty 500
  Filled 2021-04-20: qty 5
  Filled 2021-04-20 (×2): qty 500
  Filled 2021-04-20: qty 5
  Filled 2021-04-20 (×4): qty 500
  Filled 2021-04-20: qty 5
  Filled 2021-04-20: qty 500
  Filled 2021-04-20: qty 5
  Filled 2021-04-20: qty 500

## 2021-04-20 MED ORDER — ONDANSETRON 4 MG PO TBDP: 4.0000 mg | ORAL_TABLET | Freq: Four times a day (QID) | ORAL | Status: DC | PRN

## 2021-04-20 MED ORDER — FENTANYL CITRATE (PF) 100 MCG/2ML IJ SOLN
25.0000 ug | INTRAMUSCULAR | Status: DC | PRN
  Administered 2021-04-20 (×3): 50 ug via INTRAVENOUS

## 2021-04-20 MED ORDER — CHLORHEXIDINE GLUCONATE 0.12 % MT SOLN: 15.0000 mL | Freq: Once | OROMUCOSAL | Status: AC

## 2021-04-20 MED ORDER — HYDROMORPHONE HCL 1 MG/ML IJ SOLN
0.5000 mg | INTRAMUSCULAR | Status: DC | PRN
  Administered 2021-04-20 – 2021-04-21 (×8): 0.5 mg via INTRAVENOUS
  Filled 2021-04-20 (×7): qty 0.5

## 2021-04-20 MED ORDER — PROPOFOL 10 MG/ML IV BOLUS
INTRAVENOUS | Status: DC | PRN
  Administered 2021-04-20: 200 mg via INTRAVENOUS

## 2021-04-20 MED ORDER — FENTANYL CITRATE (PF) 100 MCG/2ML IJ SOLN
INTRAMUSCULAR | Status: AC
  Filled 2021-04-20: qty 2

## 2021-04-20 MED ORDER — MIDAZOLAM HCL 5 MG/5ML IJ SOLN
INTRAMUSCULAR | Status: DC | PRN
  Administered 2021-04-20: 2 mg via INTRAVENOUS

## 2021-04-20 MED ORDER — SODIUM CHLORIDE 0.9 % IV SOLN
2.0000 g | INTRAVENOUS | Status: AC
  Administered 2021-04-20: 2 g via INTRAVENOUS

## 2021-04-20 MED ORDER — 0.9 % SODIUM CHLORIDE (POUR BTL) OPTIME
TOPICAL | Status: DC | PRN
  Administered 2021-04-20 (×6): 1000 mL

## 2021-04-20 MED ORDER — SUCCINYLCHOLINE CHLORIDE 200 MG/10ML IV SOSY
PREFILLED_SYRINGE | INTRAVENOUS | Status: DC | PRN
  Administered 2021-04-20: 100 mg via INTRAVENOUS

## 2021-04-20 MED ORDER — LACTATED RINGERS IV SOLN: INTRAVENOUS | Status: DC

## 2021-04-20 MED ORDER — STERILE WATER FOR IRRIGATION IR SOLN
Status: DC | PRN
  Administered 2021-04-20: 1000 mL

## 2021-04-20 MED ORDER — SUGAMMADEX SODIUM 500 MG/5ML IV SOLN
INTRAVENOUS | Status: DC | PRN
Start: 2021-04-20 — End: 2021-04-20
  Administered 2021-04-20: 200 mg via INTRAVENOUS

## 2021-04-20 MED ORDER — FENTANYL CITRATE (PF) 100 MCG/2ML IJ SOLN
INTRAMUSCULAR | Status: DC | PRN
  Administered 2021-04-20: 50 ug via INTRAVENOUS
  Administered 2021-04-20 (×3): 100 ug via INTRAVENOUS

## 2021-04-20 MED ORDER — ASPIRIN 81 MG PO CHEW
81.0000 mg | CHEWABLE_TABLET | Freq: Every day | ORAL | Status: DC
  Administered 2021-04-21 – 2021-04-30 (×10): 81 mg via ORAL
  Filled 2021-04-20 (×11): qty 1

## 2021-04-20 MED ORDER — METRONIDAZOLE 500 MG/100ML IV SOLN
500.0000 mg | INTRAVENOUS | Status: AC
  Administered 2021-04-20: 500 mg via INTRAVENOUS

## 2021-04-20 MED ORDER — ONDANSETRON HCL 4 MG/2ML IJ SOLN
INTRAMUSCULAR | Status: AC
  Filled 2021-04-20: qty 2

## 2021-04-20 MED ORDER — FENTANYL CITRATE (PF) 250 MCG/5ML IJ SOLN
INTRAMUSCULAR | Status: AC
  Filled 2021-04-20: qty 5

## 2021-04-20 MED ORDER — SODIUM CHLORIDE 0.9 % IV SOLN
INTRAVENOUS | Status: AC
  Filled 2021-04-20: qty 20

## 2021-04-20 MED ORDER — ONDANSETRON HCL 4 MG/2ML IJ SOLN: 4.0000 mg | Freq: Once | INTRAMUSCULAR | Status: DC | PRN

## 2021-04-20 MED ORDER — CHLORHEXIDINE GLUCONATE CLOTH 2 % EX PADS
6.0000 | MEDICATED_PAD | Freq: Every day | CUTANEOUS | Status: DC
  Administered 2021-04-21 – 2021-04-23 (×3): 6 via TOPICAL

## 2021-04-20 MED ORDER — LATANOPROST 0.005 % OP SOLN
1.0000 [drp] | Freq: Every day | OPHTHALMIC | Status: DC
  Administered 2021-04-20 – 2021-04-29 (×10): 1 [drp] via OPHTHALMIC
  Filled 2021-04-20 (×2): qty 2.5

## 2021-04-20 MED ORDER — PANTOPRAZOLE SODIUM 40 MG IV SOLR
40.0000 mg | Freq: Two times a day (BID) | INTRAVENOUS | Status: DC
  Administered 2021-04-20 – 2021-04-30 (×20): 40 mg via INTRAVENOUS
  Filled 2021-04-20 (×20): qty 40

## 2021-04-20 MED ORDER — METOPROLOL TARTRATE 5 MG/5ML IV SOLN
INTRAVENOUS | Status: DC | PRN
  Administered 2021-04-20: 2 mg via INTRAVENOUS

## 2021-04-20 MED ORDER — ROCURONIUM BROMIDE 10 MG/ML (PF) SYRINGE
PREFILLED_SYRINGE | INTRAVENOUS | Status: AC
  Filled 2021-04-20: qty 10

## 2021-04-20 MED ORDER — MIDAZOLAM HCL 2 MG/2ML IJ SOLN
INTRAMUSCULAR | Status: AC
  Filled 2021-04-20: qty 2

## 2021-04-20 MED ORDER — ONDANSETRON HCL 4 MG/2ML IJ SOLN
4.0000 mg | Freq: Four times a day (QID) | INTRAMUSCULAR | Status: DC | PRN
  Filled 2021-04-20: qty 2

## 2021-04-20 MED ORDER — PHENYLEPHRINE HCL (PRESSORS) 10 MG/ML IV SOLN
INTRAVENOUS | Status: DC | PRN
  Administered 2021-04-20 (×2): 100 ug via INTRAVENOUS

## 2021-04-20 MED ORDER — ACETAMINOPHEN 10 MG/ML IV SOLN
1000.0000 mg | Freq: Once | INTRAVENOUS | Status: DC | PRN
  Administered 2021-04-20: 1000 mg via INTRAVENOUS

## 2021-04-20 MED ORDER — ORAL CARE MOUTH RINSE: 15.0000 mL | Freq: Once | OROMUCOSAL | Status: AC

## 2021-04-20 MED ORDER — HYDROMORPHONE HCL 1 MG/ML IJ SOLN
INTRAMUSCULAR | Status: AC
  Filled 2021-04-20: qty 1

## 2021-04-20 MED ORDER — SUCCINYLCHOLINE CHLORIDE 200 MG/10ML IV SOSY
PREFILLED_SYRINGE | INTRAVENOUS | Status: AC
  Filled 2021-04-20: qty 10

## 2021-04-20 MED ORDER — KCL IN DEXTROSE-NACL 20-5-0.45 MEQ/L-%-% IV SOLN
INTRAVENOUS | Status: DC
Start: 2021-04-20 — End: 2021-04-27
  Filled 2021-04-20 (×13): qty 1000

## 2021-04-20 MED ORDER — DEXAMETHASONE SODIUM PHOSPHATE 10 MG/ML IJ SOLN
INTRAMUSCULAR | Status: DC | PRN
  Administered 2021-04-20: 5 mg via INTRAVENOUS

## 2021-04-20 MED ORDER — INSULIN ASPART 100 UNIT/ML IJ SOLN
0.0000 [IU] | INTRAMUSCULAR | Status: DC
Start: 2021-04-20 — End: 2021-04-30
  Administered 2021-04-20 – 2021-04-21 (×3): 3 [IU] via SUBCUTANEOUS
  Administered 2021-04-21: 2 [IU] via SUBCUTANEOUS
  Administered 2021-04-21 – 2021-04-22 (×4): 3 [IU] via SUBCUTANEOUS
  Administered 2021-04-22 (×2): 2 [IU] via SUBCUTANEOUS
  Administered 2021-04-22 (×2): 3 [IU] via SUBCUTANEOUS
  Administered 2021-04-23: 2 [IU] via SUBCUTANEOUS
  Administered 2021-04-23: 3 [IU] via SUBCUTANEOUS
  Administered 2021-04-23: 2 [IU] via SUBCUTANEOUS
  Administered 2021-04-23: 3 [IU] via SUBCUTANEOUS
  Administered 2021-04-23: 2 [IU] via SUBCUTANEOUS
  Administered 2021-04-24: 3 [IU] via SUBCUTANEOUS
  Administered 2021-04-24: 2 [IU] via SUBCUTANEOUS
  Administered 2021-04-24: 3 [IU] via SUBCUTANEOUS
  Administered 2021-04-24 (×2): 2 [IU] via SUBCUTANEOUS
  Administered 2021-04-25 (×2): 3 [IU] via SUBCUTANEOUS
  Administered 2021-04-25 (×3): 2 [IU] via SUBCUTANEOUS
  Administered 2021-04-25 – 2021-04-27 (×9): 3 [IU] via SUBCUTANEOUS
  Administered 2021-04-27: 2 [IU] via SUBCUTANEOUS
  Administered 2021-04-27: 3 [IU] via SUBCUTANEOUS
  Administered 2021-04-27: 2 [IU] via SUBCUTANEOUS
  Administered 2021-04-28 – 2021-04-29 (×4): 3 [IU] via SUBCUTANEOUS
  Administered 2021-04-29: 2 [IU] via SUBCUTANEOUS
  Administered 2021-04-29: 3 [IU] via SUBCUTANEOUS
  Administered 2021-04-30 (×2): 2 [IU] via SUBCUTANEOUS

## 2021-04-20 SURGICAL SUPPLY — 49 items
BAG COUNTER SPONGE SURGICOUNT (BAG) ×2 IMPLANT
BLADE CLIPPER SURG (BLADE) IMPLANT
CANISTER SUCT 3000ML PPV (MISCELLANEOUS) ×2 IMPLANT
CHLORAPREP W/TINT 26 (MISCELLANEOUS) ×2 IMPLANT
COVER SURGICAL LIGHT HANDLE (MISCELLANEOUS) ×2 IMPLANT
DERMABOND ADVANCED (GAUZE/BANDAGES/DRESSINGS) ×2
DERMABOND ADVANCED .7 DNX12 (GAUZE/BANDAGES/DRESSINGS) ×2 IMPLANT
DRAPE INCISE IOBAN 66X45 STRL (DRAPES) ×2 IMPLANT
DRAPE LAPAROSCOPIC ABDOMINAL (DRAPES) ×2 IMPLANT
DRAPE WARM FLUID 44X44 (DRAPES) ×2 IMPLANT
DRSG OPSITE POSTOP 4X10 (GAUZE/BANDAGES/DRESSINGS) ×2 IMPLANT
DRSG OPSITE POSTOP 4X8 (GAUZE/BANDAGES/DRESSINGS) IMPLANT
ELECT CAUTERY BLADE 6.4 (BLADE) ×2 IMPLANT
ELECT REM PT RETURN 9FT ADLT (ELECTROSURGICAL) ×2
ELECTRODE REM PT RTRN 9FT ADLT (ELECTROSURGICAL) ×1 IMPLANT
GLOVE SURG POLY MICRO LF SZ5.5 (GLOVE) ×2 IMPLANT
GLOVE SURG UNDER POLY LF SZ6 (GLOVE) ×2 IMPLANT
GOWN STRL REUS W/ TWL LRG LVL3 (GOWN DISPOSABLE) ×2 IMPLANT
GOWN STRL REUS W/TWL LRG LVL3 (GOWN DISPOSABLE) ×2
HANDLE SUCTION POOLE (INSTRUMENTS) ×2 IMPLANT
KIT BASIN OR (CUSTOM PROCEDURE TRAY) ×2 IMPLANT
KIT TURNOVER KIT B (KITS) ×2 IMPLANT
NS IRRIG 1000ML POUR BTL (IV SOLUTION) ×4 IMPLANT
PACK GENERAL/GYN (CUSTOM PROCEDURE TRAY) ×2 IMPLANT
PAD ARMBOARD 7.5X6 YLW CONV (MISCELLANEOUS) ×2 IMPLANT
RETRACTOR WND ALEXIS 25 LRG (MISCELLANEOUS) ×1 IMPLANT
RTRCTR WOUND ALEXIS 25CM LRG (MISCELLANEOUS) ×2
SLEEVE SUCTION CATH 165 (SLEEVE) ×2 IMPLANT
SPECIMEN JAR LARGE (MISCELLANEOUS) IMPLANT
SPONGE T-LAP 18X18 ~~LOC~~+RFID (SPONGE) ×4 IMPLANT
STAPLER VISISTAT 35W (STAPLE) ×2 IMPLANT
SUCTION POOLE HANDLE (INSTRUMENTS) ×4
SUT ETHILON 3 0 FSL (SUTURE) ×2 IMPLANT
SUT MNCRL AB 4-0 PS2 18 (SUTURE) ×2 IMPLANT
SUT PDS AB 1 TP1 96 (SUTURE) ×4 IMPLANT
SUT SILK 2 0 (SUTURE) ×1
SUT SILK 2 0 SH CR/8 (SUTURE) ×2 IMPLANT
SUT SILK 2 0 TIES 10X30 (SUTURE) ×2 IMPLANT
SUT SILK 2-0 18XBRD TIE 12 (SUTURE) ×1 IMPLANT
SUT SILK 3 0 SH CR/8 (SUTURE) ×8 IMPLANT
SUT SILK 3 0 TIES 10X30 (SUTURE) ×2 IMPLANT
SUT VIC AB 3-0 MH 27 (SUTURE) ×6 IMPLANT
SUT VIC AB 3-0 SH 18 (SUTURE) IMPLANT
SUT VIC AB 3-0 SH 27 (SUTURE) ×2
SUT VIC AB 3-0 SH 27XBRD (SUTURE) ×2 IMPLANT
TOWEL GREEN STERILE (TOWEL DISPOSABLE) ×2 IMPLANT
TRAY FOLEY MTR SLVR 16FR STAT (SET/KITS/TRAYS/PACK) ×2 IMPLANT
TUBE J 18FR (TUBING) ×2 IMPLANT
YANKAUER SUCT BULB TIP NO VENT (SUCTIONS) ×2 IMPLANT

## 2021-04-20 NOTE — Interval H&P Note (Signed)
History and Physical Interval Note:  04/20/2021 6:45 AM  Bryan Vargas  has presented today for surgery, with the diagnosis of Duodenal Stricture.  The various methods of treatment have been discussed with the patient and family. After consideration of risks, benefits and other options for treatment, the patient has consented to  Procedure(s): OPEN GASTROJEJUNOSTOMY Bypass with feeding tube placement (N/A) as a surgical intervention.  The patient's history has been reviewed, patient examined, no change in status, stable for surgery.  I have reviewed the patient's chart and labs.  Questions were answered to the patient's satisfaction.  Admit to inpatient postoperatively. All questions answered.   Dwan Bolt

## 2021-04-20 NOTE — Anesthesia Procedure Notes (Signed)
Procedure Name: Intubation Date/Time: 04/20/2021 7:41 AM Performed by: Jonna Munro, CRNA Pre-anesthesia Checklist: Patient identified, Emergency Drugs available, Suction available, Patient being monitored and Timeout performed Patient Re-evaluated:Patient Re-evaluated prior to induction Oxygen Delivery Method: Circle system utilized Preoxygenation: Pre-oxygenation with 100% oxygen Induction Type: IV induction and Rapid sequence Laryngoscope Size: Glidescope, Mac and 4 Grade View: Grade I Tube type: Oral Tube size: 7.5 mm Number of attempts: 1 Airway Equipment and Method: Stylet and Video-laryngoscopy Placement Confirmation: ETT inserted through vocal cords under direct vision, positive ETCO2 and breath sounds checked- equal and bilateral Secured at: 23 cm Tube secured with: Tape Dental Injury: Teeth and Oropharynx as per pre-operative assessment

## 2021-04-20 NOTE — Op Note (Signed)
Date: 04/20/21  Patient: Bryan Vargas MRN: [id number]  Preoperative Diagnosis: Gastric outlet obstruction secondary to benign duodenal stricture Postoperative Diagnosis: Same  Procedure: Exploratory laparotomy, side-to-side antecolic gastrojejunostomy, feeding jejunostomy tube  Surgeon: Michaelle Birks, MD Assistant: Zacarias Pontes, MD (Resident)  EBL: 50 mL  Anesthesia: General endotracheal  Specimens: None  Indications: Bryan Vargas is a 70 yo male who presented with early satiety and weight loss over the last few months, which has acutely worsened in recent weeks. EGD showed distal gastric ulcers and a stenosis of the proximal duodenum, multiple biopsies of which were benign and consistent with peptic duodenitis. His gastric ulcers have healed with PPI therapy. He has had multiple endoscopic dilations of the duodenal stricture, all of which have failed to provide long-term relief of his obstruction. He was then referred to surgery for evaluation.  Findings: Massive gastric distension with retained food particles and about 4 liters of fluid. Antrum and pylorus were patent. Fibrotic thickening of the proximal duodenum consistent with the known stricture.  Procedure details: Informed consent was obtained in the preoperative area prior to the procedure. The patient was brought to the operating room and placed on the table in the supine position. General anesthesia was induced and appropriate lines and drains were placed for intraoperative monitoring. Perioperative antibiotics were administered per SCIP guidelines. The abdomen was prepped and draped in the usual sterile fashion. A pre-procedure timeout was taken verifying patient identity, surgical site and procedure to be performed.  An upper midline skin incision was made and the subcutaneous tissue was divided with cautery. The fascia was opened along the linea alba and the peritoneal cavity was entered. The stomach was massively dilated on entry  into the abdomen.  The falciform ligament was ligated with 2-0 silk ties and divided.  A Bookwalter retractor and Allexis wound protector replaced.  The pylorus was palpated and was patent and there were no mass lesions palpable within the distal stomach.  There was fibrotic thickening of the proximal duodenum at D1 and D2 consistent with the known stricture.  The gastrocolic omentum was opened with cautery to enter the lesser sac.  Vessels within the omentum were ligated with 2-0 silk ties.  The ligament of Treitz was identified and a point approximately 40 cm distal to the ligament of Treitz was brought up to the stomach without tension in an antecolic fashion.  A gastrotomy was made on the posterior stomach on the body and a copious volume of fluid and food debris was suctioned from the stomach.  Once the stomach had been completely decompressed the nasogastric tube tip was palpated within the stomach and appropriate position was confirmed.  A handsewn side-to-side loop gastrojejunostomy was then created on the posterior stomach.  3-0 silk Lembert sutures were placed for the back row, and the inner row was sewn with a running 3-0 Vicryl in Boswell fashion.  An anterior row of 3-0 silk Lembert sutures was placed.  At th completion of the anastomosis the lumen was palpated and was widely patent.  The abdomen was then copiously irrigated with several liters of warm saline.  Next a point for the feeding jejunostomy tube was selected on the efferent limb of jejunum approximately 30cm distal to the GJ anastomosis.  Two rows of pursestring sutures were placed with 3-0 silk suture.  An enterotomy was created in the center of the pursestrings.  An 70 French jejunostomy tube was brought onto the field and passed through the left abdominal wall, and the tube  tip was inserted into the jejunum and milked distally.  The pursestring sutures were tied down and the balloon was inflated with 1 mL of sterile water.  A Witzel tunnel  was then created over the tube with 3-0 silk sutures.  The jejunum was then tacked to the abdominal wall around the feeding tube using 3-0 Vicryl suture.  The small bowel was palpated around the feeding tube and the lumen remained patent without obstruction.  The bumper was cinched down to the skin and secured with 2-0 nylon suture.  The fascia was closed with a running looped 1 PDS suture and the skin was closed with staples.  A sterile dressing was applied.  The patient tolerated the procedure with no apparent complications.  All counts were correct x2 at the end of the procedure. The patient was extubated and taken to PACU in stable condition.  Michaelle Birks, MD 04/20/21 10:07 AM

## 2021-04-20 NOTE — Progress Notes (Signed)
Bryan Vargas  Code Status: FULL [name] is a 70 y.o. male patient admitted from PACU awake, alert - oriented X4 - no acute distress noted. VSS -  no c/o shortness of breath, no c/o chest pain. Call light within reach, patient able to voice, and demonstrate understanding. Foley in place. NG tube placed on low intermittent suction per Dr. Zenia Resides. Skin clean and dry, midline incision dressing intact.  No evidence of skin break down noted on exam.  ?  Will cont to eval and treat per MD orders.  Melonie Florida, RN  04/20/2021

## 2021-04-20 NOTE — Anesthesia Postprocedure Evaluation (Signed)
Anesthesia Post Note  Patient: Bryan Vargas  Procedure(s) Performed: OPEN GASTROJEJUNOSTOMY Bypass with feeding jejunostomy tube placement (Abdomen)     Patient location during evaluation: PACU Anesthesia Type: General Level of consciousness: awake Pain management: pain level controlled Vital Signs Assessment: post-procedure vital signs reviewed and stable Respiratory status: spontaneous breathing, nonlabored ventilation, respiratory function stable and patient connected to nasal cannula oxygen Cardiovascular status: blood pressure returned to baseline and stable Postop Assessment: no apparent nausea or vomiting Anesthetic complications: no   No notable events documented.  Last Vitals:  Vitals:   04/20/21 1656 04/20/21 1944  BP: (!) 141/82 (!) 145/83  Pulse: 89 89  Resp: 18 (!) 22  Temp: 36.7 C 36.6 C  SpO2: 96% 97%    Last Pain:  Vitals:   04/20/21 2020  TempSrc:   PainSc: 3                  Lachanda Buczek P Abie Killian

## 2021-04-20 NOTE — Anesthesia Preprocedure Evaluation (Addendum)
Anesthesia Evaluation  Patient identified by MRN, date of birth, ID band Patient awake    Reviewed: Allergy & Precautions, NPO status , Patient's Chart, lab work & pertinent test results  Airway Mallampati: IV  TM Distance: >3 FB Neck ROM: Full    Dental no notable dental hx.    Pulmonary asthma (childhood) ,    Pulmonary exam normal breath sounds clear to auscultation       Cardiovascular + CAD  Normal cardiovascular exam Rhythm:Regular Rate:Normal  ECG: SR, rate 73  ECHO: 1. Left ventricular ejection fraction, by estimation, is 55 to 60%. The left ventricle has normal function. The left ventricle has no regional wall motion abnormalities. There is mild left ventricular hypertrophy. Left ventricular diastolic parameters were normal. 2. Right ventricular systolic function is normal. The right ventricular size is normal. There is normal pulmonary artery systolic pressure. 3. The mitral valve is normal in structure. Trivial mitral valve regurgitation. 4. The aortic valve is tricuspid. Aortic valve regurgitation is not visualized. Mild aortic valve sclerosis is present, with no evidence of aortic valve stenosis.   Neuro/Psych negative neurological ROS  negative psych ROS   GI/Hepatic PUD, GERD  Medicated and Controlled,(+) Hepatitis -  Endo/Other  diabetes  Renal/GU Renal disease     Musculoskeletal  (+) Arthritis , Gout   Abdominal   Peds  Hematology negative hematology ROS (+)   Anesthesia Other Findings Duodenal Stricture  Reproductive/Obstetrics                            Anesthesia Physical Anesthesia Plan  ASA: 2  Anesthesia Plan: General   Post-op Pain Management:    Induction: Intravenous  PONV Risk Score and Plan: 3 and Ondansetron, Dexamethasone, Midazolam and Treatment may vary due to age or medical condition  Airway Management Planned: Oral ETT and Video Laryngoscope  Planned  Additional Equipment:   Intra-op Plan:   Post-operative Plan: Extubation in OR  Informed Consent: I have reviewed the patients History and Physical, chart, labs and discussed the procedure including the risks, benefits and alternatives for the proposed anesthesia with the patient or authorized representative who has indicated his/her understanding and acceptance.     Dental advisory given  Plan Discussed with: CRNA  Anesthesia Plan Comments:        Anesthesia Quick Evaluation

## 2021-04-20 NOTE — Transfer of Care (Signed)
Immediate Anesthesia Transfer of Care Note  Patient: Bryan Vargas  Procedure(s) Performed: OPEN GASTROJEJUNOSTOMY Bypass with feeding jejunostomy tube placement (Abdomen)  Patient Location: PACU  Anesthesia Type:General  Level of Consciousness: awake, alert , oriented and patient cooperative  Airway & Oxygen Therapy: Patient Spontanous Breathing and Patient connected to face mask oxygen  Post-op Assessment: Report given to RN, Post -op Vital signs reviewed and stable and Patient moving all extremities X 4  Post vital signs: Reviewed and stable  Last Vitals:  Vitals Value Taken Time  BP 134/80 04/20/21 0959  Temp    Pulse 82 04/20/21 1002  Resp 17 04/20/21 1002  SpO2 100 % 04/20/21 1002  Vitals shown include unvalidated device data.  Last Pain:  Vitals:   04/20/21 0652  TempSrc:   PainSc: 0-No pain      Patients Stated Pain Goal: 3 ([id number])  Complications: No notable events documented.

## 2021-04-21 ENCOUNTER — Ambulatory Visit (HOSPITAL_COMMUNITY): Admission: RE | Admit: 2021-04-21 | Payer: Medicare Other | Source: Home / Self Care | Admitting: Gastroenterology

## 2021-04-21 ENCOUNTER — Encounter (HOSPITAL_COMMUNITY): Payer: Self-pay | Admitting: Surgery

## 2021-04-21 ENCOUNTER — Other Ambulatory Visit: Payer: Self-pay

## 2021-04-21 ENCOUNTER — Encounter (HOSPITAL_COMMUNITY): Admission: RE | Payer: Self-pay | Source: Home / Self Care

## 2021-04-21 LAB — BASIC METABOLIC PANEL
Anion gap: 6 (ref 5–15)
BUN: 12 mg/dL (ref 8–23)
CO2: 25 mmol/L (ref 22–32)
Calcium: 8.5 mg/dL — ABNORMAL LOW (ref 8.9–10.3)
Chloride: 105 mmol/L (ref 98–111)
Creatinine, Ser: 1.18 mg/dL (ref 0.61–1.24)
GFR, Estimated: >60 mL/min (ref >60)
Glucose, Bld: 210 mg/dL — ABNORMAL HIGH (ref 70–99)
Potassium: 4.4 mmol/L (ref 3.5–5.1)
Sodium: 136 mmol/L (ref 135–145)

## 2021-04-21 LAB — CBC
HCT: 42.7 % (ref 39.0–52.0)
Hemoglobin: 13.8 g/dL (ref 13.0–17.0)
MCH: 30.7 pg (ref 26.0–34.0)
MCHC: 32.3 g/dL (ref 30.0–36.0)
MCV: 95.1 fL (ref 80.0–100.0)
Platelets: 172 10*3/uL (ref 150–400)
RBC: 4.49 MIL/uL (ref 4.22–5.81)
RDW: 13.1 % (ref 11.5–15.5)
WBC: 12.9 10*3/uL — ABNORMAL HIGH (ref 4.0–10.5)
nRBC: 0 % (ref 0.0–0.2)

## 2021-04-21 LAB — GLUCOSE, CAPILLARY
Glucose-Capillary: 172 mg/dL — ABNORMAL HIGH (ref 70–99)
Glucose-Capillary: 174 mg/dL — ABNORMAL HIGH (ref 70–99)
Glucose-Capillary: 145 mg/dL — ABNORMAL HIGH (ref 70–99)
Glucose-Capillary: 163 mg/dL — ABNORMAL HIGH (ref 70–99)
Glucose-Capillary: 165 mg/dL — ABNORMAL HIGH (ref 70–99)
Glucose-Capillary: 154 mg/dL — ABNORMAL HIGH (ref 70–99)

## 2021-04-21 SURGERY — ESOPHAGOGASTRODUODENOSCOPY (EGD) WITH PROPOFOL
Anesthesia: Monitor Anesthesia Care

## 2021-04-21 MED ORDER — VITAL HIGH PROTEIN PO LIQD
1000.0000 mL | ORAL | Status: DC
  Filled 2021-04-21: qty 1000

## 2021-04-21 MED ORDER — OSMOLITE 1.5 CAL PO LIQD
1000.0000 mL | ORAL | Status: DC
Start: 2021-04-21 — End: 2021-04-23
  Administered 2021-04-21: 1000 mL
  Filled 2021-04-21: qty 1000

## 2021-04-21 MED ORDER — FREE WATER
30.0000 mL | Status: DC
  Administered 2021-04-21 – 2021-04-28 (×42): 30 mL

## 2021-04-21 NOTE — Progress Notes (Signed)
Patient refusing foley to be pulled at this time.

## 2021-04-21 NOTE — Progress Notes (Signed)
Initial Nutrition Assessment  DOCUMENTATION CODES:   Non-severe (moderate) malnutrition in context of chronic illness  INTERVENTION:   Tube Feeding via J-tube:  Initiate Osmolite 1.5 at 10 ml/hr via J-tube today Goal rate Osmolite 1.5 at 65 ml/hr Pro-Source TF 45 mL BID Provides 2420 kcals, 120 g of protein and 1186 mL of free water  Hopefully will be able to transition to intermittent feedings prior to discharge  NUTRITION DIAGNOSIS:   Moderate Malnutrition related to acute illness as evidenced by energy intake < 75% for > 7 days, percent weight loss.  GOAL:   Patient will meet greater than or equal to 90% of their needs  MONITOR:   Labs, Weight trends, Diet advancement, TF tolerance  REASON FOR ASSESSMENT:   Consult Enteral/tube feeding initiation and management  ASSESSMENT:   70 yo male admitted with GOO secondary to benign D1/D2 peptic duodenal stricture. PMH includes DM, GERD, esophageal varices, hepatitis  8/29 Gastrojejunal bypass and J-tube placement  Wife at bedside. Pt alert, uncomfortable.   NG in place with dark bilious output. J-tube in place. Starting trickle TF today  Pt has not had anything solid to eat since July. Pt reports eating soup, thinned down mashed potatoes, ice cream, yogurt, jello, Pt has been taking protein shakes as well.   Pt has experienced weight loss; pt actually wanting to lose weight and began attempting to lose weight in November. He lost around 10 pounds between Thanksgiving and Christmas. Pt with 25 pounds weight loss since beginning of July. Pt weighed 115 kg in January, current wt 97 kg. 15% wt loss. Pt weighed 105 kg beginning of July. 7.7 % wt loss  D5-1/2 NS with KCl at 84 ml/hr  Labs: reviewed Meds: ss novolog  NUTRITION - FOCUSED PHYSICAL EXAM:  Flowsheet Row Most Recent Value  Orbital Region No depletion  Upper Arm Region No depletion  Thoracic and Lumbar Region No depletion  Buccal Region No depletion  Temple  Region No depletion  Clavicle Bone Region No depletion  Clavicle and Acromion Bone Region No depletion  Scapular Bone Region No depletion  Dorsal Hand No depletion  Patellar Region No depletion  Anterior Thigh Region No depletion  Edema (RD Assessment) None       Diet Order:   Diet Order             Diet NPO time specified Except for: Ice Chips  Diet effective now                   EDUCATION NEEDS:   Education needs have been addressed  Skin:  Skin Assessment: Skin Integrity Issues: Skin Integrity Issues:: Incisions Incisions: new surgical J-tube  Last BM:  8/28  Height:   Ht Readings from Last 1 Encounters:  04/20/21 '6\' 3"'$  (1.905 m)    Weight:   Wt Readings from Last 1 Encounters:  04/20/21 97.1 kg     BMI:  Body mass index is 26.75 kg/m.  Estimated Nutritional Needs:   Kcal:  2300-2500 kcals  Protein:  115-130 g  Fluid:  >/= 2 L   Kerman Passey MS, RDN, LDN, CNSC Registered Dietitian III Clinical Nutrition RD Pager and On-Call Pager Number Located in [place]

## 2021-04-21 NOTE — Progress Notes (Signed)
    1 Day Post-Op  Subjective: Afebrile, hemodynamically stable overnight. Total NG output 620 since surgery. Pain is well-controlled. Good UOP, labs unremarkable.   Objective: Vital signs in last 24 hours: Temp:  [97.2 F (36.2 C)-98 F (36.7 C)] 97.9 F (36.6 C) (08/30 0317) Pulse Rate:  [80-100] 96 (08/30 0317) Resp:  [17-27] 17 (08/30 0317) BP: (126-145)/(75-88) 141/82 (08/30 0317) SpO2:  [93 %-100 %] 93 % (08/30 0317) Last BM Date: 04/19/21  Intake/Output from previous day: 08/29 0701 - 08/30 0700 In: 2502.1 [P.O.:60; I.V.:2032.1; NG/GT:30; IV Piggyback:349.9] Out: 2245 [Urine:1575; Emesis/NG output:620; Blood:50] Intake/Output this shift: No intake/output data recorded.  PE: General: resting comfortably, NAD Neuro: alert and oriented, no focal deficits HEENT: NG in place draining bilious contents Resp: normal work of breathing Abdomen: soft, nondistended, nontender to palpation. Midline incision clean and dry with staples in place, no erythema or induration. J tube in place LUQ, capped. GU: foley in place draining clear yellow urine   Lab Results:  Recent Labs    04/20/21 0631 04/21/21 0146  WBC 6.0 12.9*  HGB 12.5* 13.8  HCT 39.1 42.7  PLT 146* 172   BMET Recent Labs    04/20/21 0631 04/21/21 0146  NA 140 136  K 4.7 4.4  CL 107 105  CO2 28 25  GLUCOSE 96 210*  BUN 9 12  CREATININE 1.15 1.18  CALCIUM 9.4 8.5*   PT/INR No results for input(s): LABPROT, INR in the last 72 hours. CMP     Component Value Date/Time   NA 136 04/21/2021 0146   K 4.4 04/21/2021 0146   CL 105 04/21/2021 0146   CO2 25 04/21/2021 0146   GLUCOSE 210 (H) 04/21/2021 0146   BUN 12 04/21/2021 0146   CREATININE 1.18 04/21/2021 0146   CALCIUM 8.5 (L) 04/21/2021 0146   PROT 6.6 03/07/2021 0635   ALBUMIN 3.5 03/07/2021 0635   AST 25 03/07/2021 0635   ALT 22 03/07/2021 0635   ALKPHOS 58 03/07/2021 0635   BILITOT 1.0 03/07/2021 0635   GFRNONAA >60 04/21/2021 0146    Lipase  No results found for: LIPASE     Studies/Results: No results found.     Assessment/Plan  70 yo male with gastric outlet obstruction secondary to a benign D1/D2 peptic duodenal stricture, POD1 s/p gastrojejunal bypass and feeding jejunostomy tube. - Continue NG to low intermittent suction, anticipate patient will have gastroparesis given prolonged obstruction. - Maintenance IV fluids - Begin trophic J tube feeds at 10 ml/hr, do not advance today. Nutrition consulted. - Ambulate, pulmonary toilet, PT consulted - Remove foley catheter - No crushed medications are to be given via J tube - Multimodal pain control - VTE: lovenox, SCDs - Dispo: inpatient, med-surg    LOS: 1 day    Michaelle Birks, MD [facility] Surgery General, Hepatobiliary and Pancreatic Surgery 04/21/21 8:11 AM

## 2021-04-21 NOTE — TOC Initial Note (Signed)
Transition of Care [facility]) - Initial/Assessment Note    Patient Details  Name: Bryan Vargas MRN: [id number] Date of Birth: 06-25-1951  Transition of Care [facility]) CM/SW Contact:    Marilu Favre, RN Phone Number: 04/21/2021, 1:38 PM  Clinical Narrative:                 PAtient from home with significant other Bryan Vargas [id number]   Spoke to patient and Bryan Vargas at bedside. Confirmed face sheet information and Bryan Vargas's number.   Explained plan for tube feedings at home. Infusion company will be [employer] with same will come to hospital room prior to discharge and start teaching with home pump and supplies.   Patient will have a home health nurse , however HHRN will not be able to make daily visits.   Both voiced understanding.   Their daughter in law is a home health nurse with [facility] and they are requesting [facility].   NCM called [facility] spoke to Bryan Vargas , Bryan Vargas accepted referral for [facility] and [place]. NCM faxed  clinicals to [facility] at [phone]. NCM will keep [facility] updated and fax orders once home tube feed determined and orders for TF and [facility] written   Expected Discharge Plan: [place]     Patient Goals and CMS Choice Patient states their goals for this hospitalization and ongoing recovery are:: to go home CMS Medicare.gov Compare Post Acute Care list provided to:: Patient Choice offered to / list presented to : Patient Bryan Vargas)  Expected Discharge Plan and Services Expected Discharge Plan: [place] In-house Referral: Nutrition Discharge Planning Services: CM Consult Post Acute Care Choice: Home Health, Durable Medical Equipment Living arrangements for the past 2 months: Single Family Home                 DME Arranged: 3-N-1, Walker rolling DME Agency: [employer]       HH Arranged: PT, RN HH Agency: [facility] Date HH Agency Contacted: 04/21/21 Time HH Agency Contacted: [phone] Representative spoke  with at [place]: [place]  Prior Living Arrangements/Services Living arrangements for the past 2 months: [place] with:: Significant Other Patient language and need for interpreter reviewed:: Yes Do you feel safe going back to the place where you live?: Yes      Need for Family Participation in Patient Care: Yes (Comment) Care giver support system in place?: Yes (comment)   Criminal Activity/Legal Involvement Pertinent to Current Situation/Hospitalization: No - Comment as needed  Activities of Daily Living Home Assistive Devices/Equipment: CBG Meter ADL Screening (condition at time of admission) Patient's cognitive ability adequate to safely complete daily activities?: Yes Is the patient deaf or have difficulty hearing?: No Does the patient have difficulty seeing, even when wearing glasses/contacts?: No Does the patient have difficulty concentrating, remembering, or making decisions?: No Patient able to express need for assistance with ADLs?: Yes Does the patient have difficulty dressing or bathing?: No Independently performs ADLs?: Yes (appropriate for developmental age) Does the patient have difficulty walking or climbing stairs?: No Weakness of Legs: None Weakness of Arms/Hands: None  Permission Sought/Granted   Permission granted to share information with : Yes, Verbal Permission Granted  Share Information with NAME: Significant other Bryan Vargas  Permission granted to share info w AGENCY: [facility]        Emotional Assessment Appearance:: Appears stated age Attitude/Demeanor/Rapport: Engaged Affect (typically observed): Accepting Orientation: : Oriented to Self, Oriented to  Place, Oriented to  Time, Oriented to Situation Alcohol / Substance Use: Not Applicable Psych Involvement: No (comment)  Admission diagnosis:  Duodenal stricture [K31.5] Gastric outlet obstruction [K31.1] Patient Active Problem List   Diagnosis Date Noted   Acute urinary retention  03/03/2021   Hematuria 03/03/2021   Esophageal varices (HCC)    PUD (peptic ulcer disease)    Duodenal stricture    Gastric outlet obstruction    Non-intractable vomiting    UTI (urinary tract infection) 02/26/2021   Dehydration 02/26/2021   Gastric dilatation 02/26/2021   GERD (gastroesophageal reflux disease) 02/26/2021   Diarrhea 02/26/2021   Hypokalemia 02/26/2021   Leukocytosis 99991111   Metabolic acidosis 99991111   AKI (acute kidney injury) ([place]) 02/25/2021   Diabetes mellitus ([place]) 02/25/2021   PCP:  Quentin Cornwall, MD Pharmacy:   [id number] [id number]: [phone]: [phone]    Social Determinants of Health (SDOH) Interventions    Readmission Risk Interventions No flowsheet data found.

## 2021-04-21 NOTE — Plan of Care (Signed)
Alert and oriented. Hemodynamically stable. Titrated off oxygen, now on room air. IS [phone]. Foley in place, No Bm this shift. NGT in place to LIWS, J tube clamped and both flushed per order. PRN pain medication administered per order.  Problem: Education: Goal: Knowledge of General Education information will improve Description: Including pain rating scale, medication(s)/side effects and non-pharmacologic comfort measures Outcome: Progressing   Problem: Elimination: Goal: Will not experience complications related to urinary retention Outcome: Progressing   Problem: Clinical Measurements: Goal: Postoperative complications will be avoided or minimized Outcome: Progressing

## 2021-04-21 NOTE — Evaluation (Signed)
Physical Therapy Evaluation Patient Details Name: Jeanluc Wegman MRN: [id number] DOB: July 25, 1951 Today's Date: 04/21/2021   History of Present Illness  70yo male who was admitted on 04/16/21 for surgical management of ongoing gastric outlet obstruction secondary to duodenal stricture. Received open gastrojejunostomy on 04/20/21. PMH OA, DM  Clinical Impression   Patient received in bed, pleasant and cooperative, eager to get OOB. Did need up to [facility] for sidelying to sit at EOB, however only required supervision with RW for short in room distances (limited by abdominal pain). Very motivated to improve physically. Left up in recliner with all needs met, chair alarm active and spouse present. May benefit from HHPT at [place], however I do anticipate that he may progress quickly with therapy with pain control and ongoing medical management. Will update reccs as/if appropriate.     Follow Up Recommendations Home health PT    Equipment Recommendations  Rolling walker with 5" wheels;3in1 (PT)    Recommendations for Other Services       Precautions / Restrictions Precautions Precautions: Fall;Other (comment) Precaution Comments: abdominal surgery/incisions, feeding tube in abdomen, NG tube Restrictions Weight Bearing Restrictions: No      Mobility  Bed Mobility Overal bed mobility: Needs Assistance Bed Mobility: Rolling;Sidelying to Sit Rolling: Min assist Sidelying to sit: Mod assist       General bed mobility comments: MinA to roll over onto his side with good form, ModA to boost all the way up to midline sitting from his side    Transfers Overall transfer level: Needs assistance Equipment used: Rolling walker (2 wheeled) Transfers: Sit to/from Stand Sit to Stand: Supervision;From elevated surface         General transfer comment: S for safety, cues for hand placement and sequencing  Ambulation/Gait Ambulation/Gait assistance: Supervision Gait Distance (Feet): 12  Feet Assistive device: Rolling walker (2 wheeled) Gait Pattern/deviations: Step-through pattern;Decreased step length - right;Decreased step length - left;Trunk flexed Gait velocity: decreased   General Gait Details: mildly impulsive getting up to standing, but steady with RW; gait distance limited by abdominal pain  Stairs            Wheelchair Mobility    Modified Rankin (Stroke Patients Only)       Balance Overall balance assessment: Mild deficits observed, not formally tested                                           Pertinent Vitals/Pain Pain Assessment: Faces Faces Pain Scale: Hurts little more Pain Location: generalized pain, arthritic pain Pain Descriptors / Indicators: Aching;Discomfort;Tightness Pain Intervention(s): Limited activity within patient's tolerance;Monitored during session    Home Living Family/patient expects to be discharged to:: Private residence Living Arrangements: Spouse/significant other Available Help at Discharge: Family;Available 24 hours/day Type of Home: House Home Access: Stairs to enter Entrance Stairs-Rails: None Entrance Stairs-Number of Steps: 1 Home Layout: One level;Laundry or work area in basement;Able to live on main level with bedroom/bathroom Home Equipment: Environmental consultant - 4 wheels;Cane - single point;Shower seat;Grab bars - tub/shower Additional Comments: usually does not use assistive devices- completely independent with mobility    Prior Function Level of Independence: Independent         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        Extremity/Trunk Assessment   Upper Extremity Assessment Upper Extremity Assessment: Defer to OT evaluation  Lower Extremity Assessment Lower Extremity Assessment: Overall WFL for tasks assessed    Cervical / Trunk Assessment Cervical / Trunk Assessment: Normal  Communication   Communication: No difficulties  Cognition  Arousal/Alertness: Awake/alert Behavior During Therapy: WFL for tasks assessed/performed Overall Cognitive Status: Within Functional Limits for tasks assessed                                 General Comments: mildly impulsive, but may have just been eagerness to get OOB; cooperative and appropriate with good command following      General Comments      Exercises     Assessment/Plan    PT Assessment Patient needs continued PT services  PT Problem List Decreased knowledge of use of DME;Decreased activity tolerance;Decreased safety awareness;Decreased knowledge of precautions;Decreased mobility       PT Treatment Interventions DME instruction;Balance training;Gait training;Stair training;Functional mobility training;Patient/family education;Therapeutic activities;Therapeutic exercise    PT Goals (Current goals can be found in the Care Plan section)  Acute Rehab PT Goals Patient Stated Goal: get more mobile PT Goal Formulation: With patient/family Time For Goal Achievement: 05/05/21 Potential to Achieve Goals: Good    Frequency Min 3X/week   Barriers to discharge        Co-evaluation               AM-PAC PT "6 Clicks" Mobility  Outcome Measure Help needed turning from your back to your side while in a flat bed without using bedrails?: A Little Help needed moving from lying on your back to sitting on the side of a flat bed without using bedrails?: A Lot Help needed moving to and from a bed to a chair (including a wheelchair)?: A Little Help needed standing up from a chair using your arms (e.g., wheelchair or bedside chair)?: A Little Help needed to walk in hospital room?: A Little Help needed climbing 3-5 steps with a railing? : A Little 6 Click Score: 17    End of Session   Activity Tolerance: Patient tolerated treatment well Patient left: in chair;with call bell/phone within reach;with chair alarm set;with family/visitor present Nurse  Communication: Mobility status PT Visit Diagnosis: Other abnormalities of gait and mobility (R26.89);Pain Pain - Right/Left:  (abdominal) Pain - part of body:  (abdominal pain)    Time: 0630-1601 PT Time Calculation (min) (ACUTE ONLY): 26 min   Charges:   PT Evaluation $PT Eval Moderate Complexity: 1 Mod PT Treatments $Gait Training: 8-22 mins        Windell Norfolk, DPT, PN2   Supplemental Physical Therapist [place]    Pager [phone] Acute Rehab Office [phone]

## 2021-04-22 ENCOUNTER — Ambulatory Visit: Payer: Medicare Other | Admitting: Gastroenterology

## 2021-04-22 DIAGNOSIS — E440 Moderate protein-calorie malnutrition: Secondary | ICD-10-CM | POA: Insufficient documentation

## 2021-04-22 LAB — GLUCOSE, CAPILLARY
Glucose-Capillary: 106 mg/dL — ABNORMAL HIGH (ref 70–99)
Glucose-Capillary: 116 mg/dL — ABNORMAL HIGH (ref 70–99)
Glucose-Capillary: 132 mg/dL — ABNORMAL HIGH (ref 70–99)
Glucose-Capillary: 135 mg/dL — ABNORMAL HIGH (ref 70–99)
Glucose-Capillary: 154 mg/dL — ABNORMAL HIGH (ref 70–99)
Glucose-Capillary: 155 mg/dL — ABNORMAL HIGH (ref 70–99)

## 2021-04-22 LAB — CBC
HCT: 41.2 % (ref 39.0–52.0)
Hemoglobin: 13.8 g/dL (ref 13.0–17.0)
MCH: 31.3 pg (ref 26.0–34.0)
MCHC: 33.5 g/dL (ref 30.0–36.0)
MCV: 93.4 fL (ref 80.0–100.0)
Platelets: 173 10*3/uL (ref 150–400)
RBC: 4.41 MIL/uL (ref 4.22–5.81)
RDW: 13.2 % (ref 11.5–15.5)
WBC: 17.6 10*3/uL — ABNORMAL HIGH (ref 4.0–10.5)
nRBC: 0 % (ref 0.0–0.2)

## 2021-04-22 LAB — BASIC METABOLIC PANEL
Anion gap: 9 (ref 5–15)
BUN: 11 mg/dL (ref 8–23)
CO2: 24 mmol/L (ref 22–32)
Calcium: 8.5 mg/dL — ABNORMAL LOW (ref 8.9–10.3)
Chloride: 104 mmol/L (ref 98–111)
Creatinine, Ser: 0.98 mg/dL (ref 0.61–1.24)
GFR, Estimated: >60 mL/min (ref >60)
Glucose, Bld: 146 mg/dL — ABNORMAL HIGH (ref 70–99)
Potassium: 4.2 mmol/L (ref 3.5–5.1)
Sodium: 137 mmol/L (ref 135–145)

## 2021-04-22 MED ORDER — LACTATED RINGERS IV BOLUS
500.0000 mL | Freq: Once | INTRAVENOUS | Status: AC
  Administered 2021-04-22: 500 mL via INTRAVENOUS

## 2021-04-22 MED ORDER — HYDROMORPHONE HCL 1 MG/ML IJ SOLN
0.5000 mg | INTRAMUSCULAR | Status: DC | PRN
  Administered 2021-04-22 – 2021-04-23 (×2): 1 mg via INTRAVENOUS
  Filled 2021-04-22 (×2): qty 1

## 2021-04-22 MED ORDER — KETOROLAC TROMETHAMINE 15 MG/ML IJ SOLN
15.0000 mg | Freq: Three times a day (TID) | INTRAMUSCULAR | Status: AC
  Administered 2021-04-22 (×3): 15 mg via INTRAVENOUS
  Filled 2021-04-22 (×3): qty 1

## 2021-04-22 NOTE — TOC Progression Note (Signed)
Transition of Care [facility]) - Progression Note    Patient Details  Name: Bryan Vargas MRN: [id number] Date of Birth: [birth year]  Transition of Care [facility]) CM/SW Contact  Jacalyn Lefevre Edson Snowball, RN Phone Number: 04/22/2021, 11:18 AM  Clinical Narrative:    NCM faxed home health orders and clinical update to Dianna at  [place]  at [phone].   Expected Discharge Plan: [place]    Expected Discharge Plan and Services Expected Discharge Plan: [place] In-house Referral: Nutrition Discharge Planning Services: CM Consult Post Acute Care Choice: Home Health, Durable Medical Equipment Living arrangements for the past 2 months: Single Family Home                 DME Arranged: 3-N-1, Walker rolling DME Agency: [employer]       HH Arranged: PT, RN HH Agency: [facility] Date [place]: 04/21/21 Time [place]: [phone] Representative spoke with at [place]: [place] ([place]) Interventions    Readmission Risk Interventions No flowsheet data found.

## 2021-04-22 NOTE — Progress Notes (Signed)
    2 Days Post-Op  Subjective: Intermittently tachycardic overnight, afebrile. Endorses significant discomfort and back pain overnight but reports feeling better this morning. NG output remains high. No flatus yet.   Objective: Vital signs in last 24 hours: Temp:  [97.7 F (36.5 C)-98.2 F (36.8 C)] 98 F (36.7 C) (08/31 0450) Pulse Rate:  [68-121] 92 (08/31 0450) Resp:  [18-20] 18 (08/31 0450) BP: (109-167)/(70-95) 154/89 (08/31 0450) SpO2:  [94 %-97 %] 97 % (08/31 0450) Weight:  [97.1 kg] 97.1 kg (08/31 0500) Last BM Date: 04/19/21  Intake/Output from previous day: 08/30 0701 - 08/31 0700 In: 1847.5 [I.V.:1591.1; NG/GT:156.3; IV Piggyback:100] Out: 2070 [Urine:870; Emesis/NG output:1200] Intake/Output this shift: No intake/output data recorded.  PE: General: resting in bed, appears mildly uncomfortable Neuro: alert and oriented, no focal deficits HEENT: NG in place draining bilious contents Resp: normal work of breathing Abdomen: soft, nondistended, nontender to palpation. Midline incision clean and dry with staples in place, no erythema or induration. J tube in place LUQ, feeds running at 10 ml/hr.   Lab Results:  Recent Labs    04/20/21 0631 04/21/21 0146  WBC 6.0 12.9*  HGB 12.5* 13.8  HCT 39.1 42.7  PLT 146* 172   BMET Recent Labs    04/20/21 0631 04/21/21 0146  NA 140 136  K 4.7 4.4  CL 107 105  CO2 28 25  GLUCOSE 96 210*  BUN 9 12  CREATININE 1.15 1.18  CALCIUM 9.4 8.5*   PT/INR No results for input(s): LABPROT, INR in the last 72 hours. CMP     Component Value Date/Time   NA 136 04/21/2021 0146   K 4.4 04/21/2021 0146   CL 105 04/21/2021 0146   CO2 25 04/21/2021 0146   GLUCOSE 210 (H) 04/21/2021 0146   BUN 12 04/21/2021 0146   CREATININE 1.18 04/21/2021 0146   CALCIUM 8.5 (L) 04/21/2021 0146   PROT 6.6 03/07/2021 0635   ALBUMIN 3.5 03/07/2021 0635   AST 25 03/07/2021 0635   ALT 22 03/07/2021 0635   ALKPHOS 58 03/07/2021 0635    BILITOT 1.0 03/07/2021 0635   GFRNONAA >60 04/21/2021 0146   Lipase  No results found for: LIPASE     Studies/Results: No results found.     Assessment/Plan  70 yo male with gastric outlet obstruction secondary to a benign D1/D2 peptic duodenal stricture, POD2 s/p gastrojejunal bypass and feeding jejunostomy tube. - Continue NG to low intermittent suction, give 500 ml bolus to replace some of GI losses. - Maintenance IV fluids - Keep feeds at trophic rate today, do not advance - Intermittent tachycardia seems secondary to poor pain control. Patient remains afebrile and abdomen is very soft. Will increase prn dilaudid dose and add scheduled toradol. CBC and BMP pending this morning. - Ambulate, pulmonary toilet, PT consulted - No crushed medications are to be given via J tube - VTE: lovenox, SCDs - Dispo: inpatient, med-surg    LOS: 2 days    Michaelle Birks, MD [facility] Surgery General, Hepatobiliary and Pancreatic Surgery 04/22/21 7:25 AM

## 2021-04-22 NOTE — Progress Notes (Signed)
Physical Therapy Treatment Patient Details Name: Bryan Vargas MRN: [id number] DOB: 1951-08-02 Today's Date: 04/22/2021    History of Present Illness 70yo male who was admitted on 04/16/21 for surgical management of ongoing gastric outlet obstruction secondary to duodenal stricture. Received open gastrojejunostomy on 04/20/21. PMH OA, DM    PT Comments    Patient received in bed, very pleasant and clearly feeling much better today- able to perform bed mobility with less physical assist, and tolerated significant progression of gait distance very well today. Continues to require cues for abdominal precautions but making great progress. Left up in recliner with all needs met, RN aware of patient status. Will continue to follow.     Follow Up Recommendations  Home health PT     Equipment Recommendations  Rolling walker with 5" wheels;3in1 (PT)    Recommendations for Other Services       Precautions / Restrictions Precautions Precautions: Fall;Other (comment) Precaution Comments: abdominal surgery/incisions, feeding tube in abdomen, NG tube Restrictions Weight Bearing Restrictions: No    Mobility  Bed Mobility Overal bed mobility: Needs Assistance Bed Mobility: Rolling;Sidelying to Sit Rolling: Min guard Sidelying to sit: Min assist       General bed mobility comments: min guard/VC for rolling onto his right side, then only MinA to boost all the way up to midline sitting at EOB    Transfers Overall transfer level: Needs assistance Equipment used: Rolling walker (2 wheeled) Transfers: Sit to/from Stand Sit to Stand: Supervision;From elevated surface         General transfer comment: S for safety, cues for hand placement and sequencing  Ambulation/Gait Ambulation/Gait assistance: Supervision Gait Distance (Feet): 300 Feet Assistive device: Rolling walker (2 wheeled) Gait Pattern/deviations: Step-through pattern;Trunk flexed Gait velocity: decreased   General Gait  Details: steady and safe with RW, gait speed slow and cautious but tolerated progression of gait distance well today   Stairs             Wheelchair Mobility    Modified Rankin (Stroke Patients Only)       Balance Overall balance assessment: Mild deficits observed, not formally tested                                          Cognition Arousal/Alertness: Awake/alert Behavior During Therapy: WFL for tasks assessed/performed Overall Cognitive Status: Within Functional Limits for tasks assessed                                 General Comments: cooperative and pleasant, more interactive today and progressing well      Exercises      General Comments        Pertinent Vitals/Pain Pain Assessment: Faces Faces Pain Scale: Hurts little more Pain Location: generalized pain, arthritic pain, L kidney pain Pain Descriptors / Indicators: Aching;Discomfort;Tightness Pain Intervention(s): Limited activity within patient's tolerance;Monitored during session    Home Living                      Prior Function            PT Goals (current goals can now be found in the care plan section) Acute Rehab PT Goals Patient Stated Goal: get more mobile PT Goal Formulation: With patient/family Time For Goal Achievement: 05/05/21 Potential to Achieve Goals: Good  Progress towards PT goals: Progressing toward goals    Frequency    Min 3X/week      PT Plan Current plan remains appropriate    Co-evaluation              AM-PAC PT "6 Clicks" Mobility   Outcome Measure  Help needed turning from your back to your side while in a flat bed without using bedrails?: A Little Help needed moving from lying on your back to sitting on the side of a flat bed without using bedrails?: A Little Help needed moving to and from a bed to a chair (including a wheelchair)?: A Little Help needed standing up from a chair using your arms (e.g.,  wheelchair or bedside chair)?: A Little Help needed to walk in hospital room?: A Little Help needed climbing 3-5 steps with a railing? : A Little 6 Click Score: 18    End of Session   Activity Tolerance: Patient tolerated treatment well Patient left: in chair;with call bell/phone within reach;with family/visitor present Nurse Communication: Mobility status PT Visit Diagnosis: Other abnormalities of gait and mobility (R26.89);Pain Pain - Right/Left:  (abdominal pain) Pain - part of body:  (abdominal pain)     Time: 1202-1230 PT Time Calculation (min) (ACUTE ONLY): 28 min  Charges:  $Gait Training: 8-22 mins $Therapeutic Activity: 8-22 mins                    Windell Norfolk, DPT, PN2   Supplemental Physical Therapist [name]    Pager [phone] Acute Rehab Office [phone]

## 2021-04-23 LAB — CBC
HCT: 38.5 % — ABNORMAL LOW (ref 39.0–52.0)
Hemoglobin: 12.6 g/dL — ABNORMAL LOW (ref 13.0–17.0)
MCH: 30.8 pg (ref 26.0–34.0)
MCHC: 32.7 g/dL (ref 30.0–36.0)
MCV: 94.1 fL (ref 80.0–100.0)
Platelets: 178 10*3/uL (ref 150–400)
RBC: 4.09 MIL/uL — ABNORMAL LOW (ref 4.22–5.81)
RDW: 13.3 % (ref 11.5–15.5)
WBC: 15.9 10*3/uL — ABNORMAL HIGH (ref 4.0–10.5)
nRBC: 0 % (ref 0.0–0.2)

## 2021-04-23 LAB — BASIC METABOLIC PANEL
Anion gap: 7 (ref 5–15)
BUN: 11 mg/dL (ref 8–23)
CO2: 22 mmol/L (ref 22–32)
Calcium: 8.1 mg/dL — ABNORMAL LOW (ref 8.9–10.3)
Chloride: 107 mmol/L (ref 98–111)
Creatinine, Ser: 0.84 mg/dL (ref 0.61–1.24)
GFR, Estimated: >60 mL/min (ref >60)
Glucose, Bld: 154 mg/dL — ABNORMAL HIGH (ref 70–99)
Potassium: 4.1 mmol/L (ref 3.5–5.1)
Sodium: 136 mmol/L (ref 135–145)

## 2021-04-23 LAB — GLUCOSE, CAPILLARY
Glucose-Capillary: 140 mg/dL — ABNORMAL HIGH (ref 70–99)
Glucose-Capillary: 152 mg/dL — ABNORMAL HIGH (ref 70–99)
Glucose-Capillary: 141 mg/dL — ABNORMAL HIGH (ref 70–99)
Glucose-Capillary: 126 mg/dL — ABNORMAL HIGH (ref 70–99)
Glucose-Capillary: 131 mg/dL — ABNORMAL HIGH (ref 70–99)

## 2021-04-23 MED ORDER — OXYCODONE HCL 5 MG/5ML PO SOLN
5.0000 mg | ORAL | Status: DC | PRN
Start: 2021-04-23 — End: 2021-04-30
  Administered 2021-04-25 – 2021-04-30 (×3): 5 mg
  Filled 2021-04-23 (×4): qty 5

## 2021-04-23 MED ORDER — OSMOLITE 1.5 CAL PO LIQD
1000.0000 mL | ORAL | Status: DC
  Administered 2021-04-23: 1000 mL

## 2021-04-23 MED ORDER — HYDROMORPHONE HCL 1 MG/ML IJ SOLN: 0.5000 mg | INTRAMUSCULAR | Status: DC | PRN

## 2021-04-23 MED ORDER — KETOROLAC TROMETHAMINE 15 MG/ML IJ SOLN
15.0000 mg | Freq: Three times a day (TID) | INTRAMUSCULAR | Status: AC
  Administered 2021-04-23 (×3): 15 mg via INTRAVENOUS
  Filled 2021-04-23 (×3): qty 1

## 2021-04-23 NOTE — Care Management Important Message (Signed)
Important Message  Patient Details  Name: Bryan Vargas MRN: [id number] Date of Birth: Jan 31, 1951   Medicare Important Message Given:  Yes     [name] 04/23/2021, 3:16 PM

## 2021-04-23 NOTE — Progress Notes (Signed)
Nutrition Follow-up  DOCUMENTATION CODES:   Non-severe (moderate) malnutrition in context of chronic illness  INTERVENTION:   Tube Feeding via J-tube:  Osmolite 1.5 at 20 ml/hr via J-tube today per MD Goal rate Osmolite 1.5 at 65 ml/hr Pro-Source TF 45 mL BID Provides 2420 kcals, 120 g of protein and 1186 mL of free water   NUTRITION DIAGNOSIS:   Moderate Malnutrition related to acute illness as evidenced by energy intake < 75% for > 7 days, percent weight loss.  Being addressed via TF   GOAL:   Patient will meet greater than or equal to 90% of their needs  Progressing  MONITOR:   Labs, Weight trends, Diet advancement, TF tolerance  REASON FOR ASSESSMENT:   Consult Enteral/tube feeding initiation and management  ASSESSMENT:   70 yo male admitted with GOO secondary to benign D1/D2 peptic duodenal stricture. PMH includes DM, GERD, esophageal varices, hepatitis  NG output 900 mL in 24 hours. No flatus or BM  Tolerating Osmolite 1.5 at 10 ml/hr via J-tube, increased to 20 ml/hr today  Pt has been ambulating well  Current wt 97.6 kg   Labs: CBG 116-155 Meds: D5-1/2NS at 84 ml/hr, ss novolog  Diet Order:   Diet Order             Diet NPO time specified Except for: Ice Chips  Diet effective now                   EDUCATION NEEDS:   Education needs have been addressed  Skin:  Skin Assessment: Skin Integrity Issues: Skin Integrity Issues:: Incisions Incisions: new surgical J-tube  Last BM:  8/28  Height:   Ht Readings from Last 1 Encounters:  04/20/21 '6\' 3"'$  (1.905 m)    Weight:   Wt Readings from Last 1 Encounters:  04/23/21 97.6 kg   BMI:  Body mass index is 26.89 kg/m.  Estimated Nutritional Needs:   Kcal:  2300-2500 kcals  Protein:  115-130 g  Fluid:  >/= 2 L  Kerman Passey MS, RDN, LDN, CNSC Registered Dietitian III Clinical Nutrition RD Pager and On-Call Pager Number Located in [place]

## 2021-04-23 NOTE — Progress Notes (Signed)
    3 Days Post-Op  Subjective: Afebrile, vitals stable, no tachycardia. Having trouble sleeping at night. NG output 900 in last 24 hours. Ambulated 3 times yesterday. No flatus or bowel movements yet.   Objective: Vital signs in last 24 hours: Temp:  [97.7 F (36.5 C)-98.5 F (36.9 C)] 98.2 F (36.8 C) (09/01 0356) Pulse Rate:  [86-110] 98 (09/01 0356) Resp:  [17-20] 18 (09/01 0356) BP: (125-148)/(71-77) 139/76 (09/01 0356) SpO2:  [92 %-99 %] 93 % (09/01 0356) Weight:  [97.6 kg] 97.6 kg (09/01 0500) Last BM Date: 04/19/21  Intake/Output from previous day: 08/31 0701 - 09/01 0700 In: 2614.9 [I.V.:1856.2; NG/GT:548.7; IV Piggyback:150] Out: 1800 [Urine:900; Emesis/NG output:900] Intake/Output this shift: No intake/output data recorded.  PE: General: resting in bed, NAD Neuro: alert and oriented, no focal deficits HEENT: NG in place draining bilious contents Resp: normal work of breathing Abdomen: soft, nondistended, nontender to palpation. Midline incision clean and dry with staples in place, no erythema or induration. J tube in place LUQ, feeds running at 10 ml/hr.   Lab Results:  Recent Labs    04/21/21 0146 04/22/21 0750  WBC 12.9* 17.6*  HGB 13.8 13.8  HCT 42.7 41.2  PLT 172 173   BMET Recent Labs    04/21/21 0146 04/22/21 0750  NA 136 137  K 4.4 4.2  CL 105 104  CO2 25 24  GLUCOSE 210* 146*  BUN 12 11  CREATININE 1.18 0.98  CALCIUM 8.5* 8.5*   PT/INR No results for input(s): LABPROT, INR in the last 72 hours. CMP     Component Value Date/Time   NA 137 04/22/2021 0750   K 4.2 04/22/2021 0750   CL 104 04/22/2021 0750   CO2 24 04/22/2021 0750   GLUCOSE 146 (H) 04/22/2021 0750   BUN 11 04/22/2021 0750   CREATININE 0.98 04/22/2021 0750   CALCIUM 8.5 (L) 04/22/2021 0750   PROT 6.6 03/07/2021 0635   ALBUMIN 3.5 03/07/2021 0635   AST 25 03/07/2021 0635   ALT 22 03/07/2021 0635   ALKPHOS 58 03/07/2021 0635   BILITOT 1.0 03/07/2021 0635    GFRNONAA >60 04/22/2021 0750   Lipase  No results found for: LIPASE     Studies/Results: No results found.     Assessment/Plan  70 yo male with gastric outlet obstruction secondary to a benign D1/D2 peptic duodenal stricture, POD3 s/p gastrojejunal bypass and feeding jejunostomy tube. - Continue NG to low intermittent suction. Output remains high. Plan to begin clamping trials once patient is having bowel function. - Maintenance IV fluids - Advance J tube feeds to 20 ml/hr today, will advance further once bowel function returns. - Pain control: begin oxycodone elixir via J tube prn. PRN dilaudid for breakthrough pain. Renew toradol, monitor renal function. - Labs pending this morning - Ambulate, pulmonary toilet, PT consulted - No crushed medications are to be given via J tube - VTE: lovenox, SCDs - Dispo: inpatient, med-surg    LOS: 3 days    Michaelle Birks, MD [facility] Surgery General, Hepatobiliary and Pancreatic Surgery 04/23/21 7:45 AM

## 2021-04-24 LAB — GLUCOSE, CAPILLARY
Glucose-Capillary: 118 mg/dL — ABNORMAL HIGH (ref 70–99)
Glucose-Capillary: 122 mg/dL — ABNORMAL HIGH (ref 70–99)
Glucose-Capillary: 126 mg/dL — ABNORMAL HIGH (ref 70–99)
Glucose-Capillary: 138 mg/dL — ABNORMAL HIGH (ref 70–99)
Glucose-Capillary: 152 mg/dL — ABNORMAL HIGH (ref 70–99)
Glucose-Capillary: 159 mg/dL — ABNORMAL HIGH (ref 70–99)

## 2021-04-24 MED ORDER — OSMOLITE 1.5 CAL PO LIQD
1000.0000 mL | ORAL | Status: DC
  Administered 2021-04-24 – 2021-04-25 (×4): 1000 mL
  Filled 2021-04-24 (×2): qty 1000

## 2021-04-24 NOTE — Progress Notes (Signed)
    4 Days Post-Op  Subjective: Afebrile, vitals stable. Denies pain. No flatus or bowel movements yet. NG remains high >1L per 24 hours.   Objective: Vital signs in last 24 hours: Temp:  [97.6 F (36.4 C)-98.3 F (36.8 C)] 97.7 F (36.5 C) (09/02 0452) Pulse Rate:  [94-105] 99 (09/02 0452) Resp:  [16-18] 18 (09/02 0452) BP: (109-141)/(69-80) 141/71 (09/02 0452) SpO2:  [94 %-99 %] 99 % (09/02 0452) Last BM Date: 04/19/21  Intake/Output from previous day: 09/01 0701 - 09/02 0700 In: 150  Out: 2350 [Urine:1050; Emesis/NG output:1300] Intake/Output this shift: No intake/output data recorded.  PE: General: resting in bed, NAD Neuro: alert and oriented, no focal deficits HEENT: NG in place draining bilious contents Resp: normal work of breathing Abdomen: soft, nondistended, nontender to palpation. Midline incision clean and dry with staples in place, no erythema or induration. J tube in place LUQ, feeds running at 20 ml/hr.   Lab Results:  Recent Labs    04/22/21 0750 04/23/21 0958  WBC 17.6* 15.9*  HGB 13.8 12.6*  HCT 41.2 38.5*  PLT 173 178   BMET Recent Labs    04/22/21 0750 04/23/21 0958  NA 137 136  K 4.2 4.1  CL 104 107  CO2 24 22  GLUCOSE 146* 154*  BUN 11 11  CREATININE 0.98 0.84  CALCIUM 8.5* 8.1*   PT/INR No results for input(s): LABPROT, INR in the last 72 hours. CMP     Component Value Date/Time   NA 136 04/23/2021 0958   K 4.1 04/23/2021 0958   CL 107 04/23/2021 0958   CO2 22 04/23/2021 0958   GLUCOSE 154 (H) 04/23/2021 0958   BUN 11 04/23/2021 0958   CREATININE 0.84 04/23/2021 0958   CALCIUM 8.1 (L) 04/23/2021 0958   PROT 6.6 03/07/2021 0635   ALBUMIN 3.5 03/07/2021 0635   AST 25 03/07/2021 0635   ALT 22 03/07/2021 0635   ALKPHOS 58 03/07/2021 0635   BILITOT 1.0 03/07/2021 0635   GFRNONAA >60 04/23/2021 0958   Lipase  No results found for: LIPASE     Studies/Results: No results found.     Assessment/Plan  70 yo male  with gastric outlet obstruction secondary to a benign D1/D2 peptic duodenal stricture, POD4 s/p gastrojejunal bypass and feeding jejunostomy tube. - Continue NG to low intermittent suction. Will begin clamping trials once patient is having bowel function. Have had multiple discussions with the patient that given his prolonged obstruction, it will likely take several weeks for gastric emptying to improve. - Maintenance IV fluids - Slowly advance J tube feeds to goal, advance by 10 ml/hr q6h. - Multimodal pain control - Ambulate, pulmonary toilet, PT consulted - No crushed medications are to be given via J tube - VTE: lovenox, SCDs - Dispo: inpatient, med-surg    LOS: 4 days    Michaelle Birks, MD [facility] Surgery General, Hepatobiliary and Pancreatic Surgery 04/24/21 7:18 AM

## 2021-04-24 NOTE — Progress Notes (Signed)
Physical Therapy Treatment Patient Details Name: Bryan Vargas MRN: [id number] DOB: [birth year] Today's Date: 04/24/2021    History of Present Illness 70yo male who was admitted on 04/16/21 for surgical management of ongoing gastric outlet obstruction secondary to duodenal stricture. Received open gastrojejunostomy on 04/20/21. PMH OA, DM    PT Comments    Patient received in recliner, pleasant and motivated to work with therapy. Spouse reports they have been able to walk multiple laps in the hallway multiple times a day without difficulty. Able to progress gait distance well today. Left sitting in 6N foyer in the sun, RN aware and ok with pt sitting off unit with family for a bit. Making excellent progress- dropped PT frequency for now due to high level of mobility, will plan to increase later on if needed once lines are pulled and we can challenge him a bit more.    Follow Up Recommendations  Home health PT     Equipment Recommendations  Rolling walker with 5" wheels;3in1 (PT)    Recommendations for Other Services       Precautions / Restrictions Precautions Precautions: Fall;Other (comment) Precaution Comments: abdominal surgery/incisions, feeding tube in abdomen, NG tube Restrictions Weight Bearing Restrictions: No    Mobility  Bed Mobility               General bed mobility comments: up in chair upon entry    Transfers Overall transfer level: Needs assistance Equipment used: Rolling walker (2 wheeled) Transfers: Sit to/from Stand Sit to Stand: Supervision         General transfer comment: S for safety, cues for hand placement and sequencing  Ambulation/Gait Ambulation/Gait assistance: Supervision Gait Distance (Feet): 1200 Feet Assistive device: Rolling walker (2 wheeled) Gait Pattern/deviations: Step-through pattern;Trunk flexed Gait velocity: decreased   General Gait Details: steady and safe with RW, gait speed slow and cautious but tolerated progression  of gait distance well today   Stairs             Wheelchair Mobility    Modified Rankin (Stroke Patients Only)       Balance Overall balance assessment: Mild deficits observed, not formally tested                                          Cognition Arousal/Alertness: Awake/alert Behavior During Therapy: WFL for tasks assessed/performed Overall Cognitive Status: Within Functional Limits for tasks assessed                                 General Comments: cooperative and pleasant, more interactive today and progressing well      Exercises      General Comments        Pertinent Vitals/Pain Pain Assessment: Faces Faces Pain Scale: Hurts a little bit Pain Location: generalized pain, arthritic pain Pain Descriptors / Indicators: Aching;Discomfort;Tightness Pain Intervention(s): Limited activity within patient's tolerance;Monitored during session    Home Living                      Prior Function            PT Goals (current goals can now be found in the care plan section) Acute Rehab PT Goals Patient Stated Goal: get more mobile PT Goal Formulation: With patient/family Time For Goal Achievement: 05/05/21 Potential to Achieve  Goals: Good Progress towards PT goals: Progressing toward goals    Frequency    Min 2X/week      PT Plan Frequency needs to be updated    Co-evaluation              AM-PAC PT "6 Clicks" Mobility   Outcome Measure  Help needed turning from your back to your side while in a flat bed without using bedrails?: A Little Help needed moving from lying on your back to sitting on the side of a flat bed without using bedrails?: A Little Help needed moving to and from a bed to a chair (including a wheelchair)?: A Little Help needed standing up from a chair using your arms (e.g., wheelchair or bedside chair)?: A Little Help needed to walk in hospital room?: None Help needed climbing 3-5  steps with a railing? : A Little 6 Click Score: 19    End of Session   Activity Tolerance: Patient tolerated treatment well Patient left: with family/visitor present;Other (comment) (sitting in 6N foyer with spouse, RN aware and OK with this) Nurse Communication: Mobility status PT Visit Diagnosis: Other abnormalities of gait and mobility (R26.89);Pain Pain - Right/Left:  (abdominal pain) Pain - part of body:  (abdominal pain)     Time: [id number] PT Time Calculation (min) (ACUTE ONLY): 23 min  Charges:  $Gait Training: 23-37 mins                    Windell Norfolk, DPT, PN2   Supplemental Physical Therapist [place]    Pager [phone] Acute Rehab Office [phone]

## 2021-04-24 NOTE — Plan of Care (Signed)
  Problem: Education: Goal: Knowledge of General Education information will improve Description: Including pain rating scale, medication(s)/side effects and non-pharmacologic comfort measures Outcome: Progressing   Problem: Nutrition: Goal: Adequate nutrition will be maintained Outcome: Progressing   Problem: Pain Managment: Goal: General experience of comfort will improve Outcome: Progressing   Problem: Skin Integrity: Goal: Risk for impaired skin integrity will decrease Outcome: Progressing   Problem: Skin Integrity: Goal: Demonstration of wound healing without infection will improve Outcome: Progressing

## 2021-04-24 NOTE — Progress Notes (Addendum)
TF increased to 30 ml/hr ; increased by 10 ml/per order. Will monitor tolerance.  1420 TF increased to 40 ml/hr. Tolerated the lost increase, will continue to monitor.

## 2021-04-25 LAB — BASIC METABOLIC PANEL
Anion gap: 5 (ref 5–15)
BUN: 15 mg/dL (ref 8–23)
CO2: 24 mmol/L (ref 22–32)
Calcium: 8.2 mg/dL — ABNORMAL LOW (ref 8.9–10.3)
Chloride: 109 mmol/L (ref 98–111)
Creatinine, Ser: 0.92 mg/dL (ref 0.61–1.24)
GFR, Estimated: >60 mL/min (ref >60)
Glucose, Bld: 121 mg/dL — ABNORMAL HIGH (ref 70–99)
Potassium: 3.9 mmol/L (ref 3.5–5.1)
Sodium: 138 mmol/L (ref 135–145)

## 2021-04-25 LAB — CBC
HCT: 36.1 % — ABNORMAL LOW (ref 39.0–52.0)
Hemoglobin: 11.8 g/dL — ABNORMAL LOW (ref 13.0–17.0)
MCH: 30.8 pg (ref 26.0–34.0)
MCHC: 32.7 g/dL (ref 30.0–36.0)
MCV: 94.3 fL (ref 80.0–100.0)
Platelets: 231 10*3/uL (ref 150–400)
RBC: 3.83 MIL/uL — ABNORMAL LOW (ref 4.22–5.81)
RDW: 13.5 % (ref 11.5–15.5)
WBC: 12 10*3/uL — ABNORMAL HIGH (ref 4.0–10.5)
nRBC: 0 % (ref 0.0–0.2)

## 2021-04-25 LAB — GLUCOSE, CAPILLARY
Glucose-Capillary: 120 mg/dL — ABNORMAL HIGH (ref 70–99)
Glucose-Capillary: 121 mg/dL — ABNORMAL HIGH (ref 70–99)
Glucose-Capillary: 134 mg/dL — ABNORMAL HIGH (ref 70–99)
Glucose-Capillary: 147 mg/dL — ABNORMAL HIGH (ref 70–99)
Glucose-Capillary: 153 mg/dL — ABNORMAL HIGH (ref 70–99)
Glucose-Capillary: 155 mg/dL — ABNORMAL HIGH (ref 70–99)
Glucose-Capillary: 164 mg/dL — ABNORMAL HIGH (ref 70–99)

## 2021-04-25 LAB — MAGNESIUM: Magnesium: 2.1 mg/dL (ref 1.7–2.4)

## 2021-04-25 MED ORDER — METOCLOPRAMIDE HCL 5 MG/ML IJ SOLN
10.0000 mg | Freq: Three times a day (TID) | INTRAMUSCULAR | Status: DC
  Administered 2021-04-25 – 2021-04-26 (×4): 10 mg via INTRAVENOUS
  Filled 2021-04-25 (×8): qty 2

## 2021-04-25 MED ORDER — METHOCARBAMOL 1000 MG/10ML IJ SOLN
500.0000 mg | Freq: Four times a day (QID) | INTRAVENOUS | Status: DC | PRN
  Filled 2021-04-25: qty 5

## 2021-04-25 NOTE — Progress Notes (Signed)
    5 Days Post-Op  Subjective: Afebrile, vitals stable. Started passing flatus and had 2 small bowel movements. Had feelings of fullness overnight but improved this morning. NG output remains very high. Tolerating tube feeds at 40.   Objective: Vital signs in last 24 hours: Temp:  [97.7 F (36.5 C)-99.5 F (37.5 C)] 98.9 F (37.2 C) (09/03 0439) Pulse Rate:  [88-103] 88 (09/03 0439) Resp:  [18-20] 18 (09/03 0439) BP: (102-134)/(66-75) 127/69 (09/03 0439) SpO2:  [96 %-98 %] 98 % (09/03 0439) Weight:  [96.3 kg] 96.3 kg (09/03 0439) Last BM Date: 04/19/21  Intake/Output from previous day: 09/02 0701 - 09/03 0700 In: 1314.3 [NG/GT:1314.3] Out: 1900 [Emesis/NG output:1900] Intake/Output this shift: Total I/O In: 1314.3 [NG/GT:1314.3] Out: 1450 [Emesis/NG output:1450]  PE: General: resting in bed, NAD Neuro: alert and oriented, no focal deficits HEENT: NG in place draining bilious contents Resp: normal work of breathing Abdomen: soft, nondistended, nontender to palpation. Midline incision clean and dry with staples in place, no erythema or induration. J tube in place LUQ, feeds running at 40 ml/hr.   Lab Results:  Recent Labs    04/23/21 0958 04/25/21 0035  WBC 15.9* 12.0*  HGB 12.6* 11.8*  HCT 38.5* 36.1*  PLT 178 231   BMET Recent Labs    04/23/21 0958 04/25/21 0035  NA 136 138  K 4.1 3.9  CL 107 109  CO2 22 24  GLUCOSE 154* 121*  BUN 11 15  CREATININE 0.84 0.92  CALCIUM 8.1* 8.2*   PT/INR No results for input(s): LABPROT, INR in the last 72 hours. CMP     Component Value Date/Time   NA 138 04/25/2021 0035   K 3.9 04/25/2021 0035   CL 109 04/25/2021 0035   CO2 24 04/25/2021 0035   GLUCOSE 121 (H) 04/25/2021 0035   BUN 15 04/25/2021 0035   CREATININE 0.92 04/25/2021 0035   CALCIUM 8.2 (L) 04/25/2021 0035   PROT 6.6 03/07/2021 0635   ALBUMIN 3.5 03/07/2021 0635   AST 25 03/07/2021 0635   ALT 22 03/07/2021 0635   ALKPHOS 58 03/07/2021 0635    BILITOT 1.0 03/07/2021 0635   GFRNONAA >60 04/25/2021 0035   Lipase  No results found for: LIPASE     Studies/Results: No results found.     Assessment/Plan  70 yo male with gastric outlet obstruction secondary to a benign D1/D2 peptic duodenal stricture, POD5 s/p gastrojejunal bypass and feeding jejunostomy tube. - Continue NG to low intermittent suction. No clamping trials today given bloating overnight. - Begin reglan '10mg'$  q8h - Continue slow advancement of J tube feeds to goal of 65 ml/hr. Keep on maintenance IV fluids at 84 ml/hr to help replace GI losses from NG tube. - Multimodal pain control - Ambulating frequently - No crushed medications are to be given via J tube - VTE: lovenox, SCDs - Dispo: inpatient, med-surg    LOS: 5 days    Michaelle Birks, MD [facility] Surgery General, Hepatobiliary and Pancreatic Surgery 04/25/21 6:51 AM

## 2021-04-25 NOTE — Plan of Care (Signed)
  Problem: Health Behavior/Discharge Planning: Goal: Ability to manage health-related needs will improve Outcome: Progressing   

## 2021-04-25 NOTE — Progress Notes (Addendum)
Tube feeding paused at 0115 as patient c/o feeling of fullness and requesting to take a break from TF.  Will reevaluate in 1hr and restart to previous tolerated dose of 80m/hr.   '@0225'$  patient returned from bathroom. No BM but patient did pass lot of gas. Pt feeling better and is no longer c/o abdominal fullness. TF restarted at 443mhr.

## 2021-04-26 LAB — GLUCOSE, CAPILLARY
Glucose-Capillary: 147 mg/dL — ABNORMAL HIGH (ref 70–99)
Glucose-Capillary: 165 mg/dL — ABNORMAL HIGH (ref 70–99)
Glucose-Capillary: 165 mg/dL — ABNORMAL HIGH (ref 70–99)
Glucose-Capillary: 170 mg/dL — ABNORMAL HIGH (ref 70–99)
Glucose-Capillary: 173 mg/dL — ABNORMAL HIGH (ref 70–99)
Glucose-Capillary: 174 mg/dL — ABNORMAL HIGH (ref 70–99)
Glucose-Capillary: 187 mg/dL — ABNORMAL HIGH (ref 70–99)

## 2021-04-26 MED ORDER — OSMOLITE 1.5 CAL PO LIQD
1000.0000 mL | ORAL | Status: DC
  Administered 2021-04-26 – 2021-04-27 (×2): 1000 mL
  Filled 2021-04-26 (×3): qty 1000

## 2021-04-26 MED ORDER — INSULIN DETEMIR 100 UNIT/ML ~~LOC~~ SOLN
5.0000 [IU] | Freq: Every day | SUBCUTANEOUS | Status: DC
  Administered 2021-04-26 – 2021-04-27 (×2): 5 [IU] via SUBCUTANEOUS
  Filled 2021-04-26 (×3): qty 0.05

## 2021-04-26 MED ORDER — BISACODYL 10 MG RE SUPP
10.0000 mg | Freq: Once | RECTAL | Status: AC
  Administered 2021-04-26: 10 mg via RECTAL
  Filled 2021-04-26: qty 1

## 2021-04-26 NOTE — Progress Notes (Signed)
Started patient on clamping Trial per Dr. Alphonzo Dublin for 3 hrs and suction for 1 hr; If nausea/vomiting/distention occurs during clamping period, put NG back to suction.  NGT clamped at this moment and will endorse to incoming shift.  Call bell within reach and will continue to monitor.

## 2021-04-26 NOTE — Progress Notes (Signed)
Clamping trial  Pt clamped from 1100-1600 Delay due to pt in bathroom attempting to have bowl movement at time of suction.  Suction 1600-1700 Total secretions 26ms   Patient is currently sitting in char again. He is clamped for the next 3 hours. Should be put to suction at 2000

## 2021-04-26 NOTE — Progress Notes (Signed)
    6 Days Post-Op  Subjective: Afebrile, vitals stable. Passing flatus, no bowel movements yesterday. Tolerating tube feeds at goal, denies bloating and nausea. NG output remains high and bilious.   Objective: Vital signs in last 24 hours: Temp:  [97.6 F (36.4 C)-98.4 F (36.9 C)] 97.6 F (36.4 C) (09/04 0329) Pulse Rate:  [88-105] 88 (09/04 0329) Resp:  [17-19] 17 (09/04 0329) BP: (106-135)/(62-81) 135/81 (09/04 0329) SpO2:  [98 %-100 %] 100 % (09/04 0329) Weight:  [96.1 kg] 96.1 kg (09/04 0333) Last BM Date: 04/25/21  Intake/Output from previous day: 09/03 0701 - 09/04 0700 In: 1970.5 [I.V.:1695.2; NG/GT:275.2] Out: 2500 [Urine:550; Emesis/NG output:1950] Intake/Output this shift: Total I/O In: 1135.2 [I.V.:1135.2] Out: 1550 [Urine:550; Emesis/NG output:1000]  PE: General: resting in bed, NAD Neuro: alert and oriented, no focal deficits HEENT: NG in place draining bilious contents Resp: normal work of breathing Abdomen: soft, nondistended, nontender to palpation. Midline incision clean and dry with staples in place, no erythema or induration. J tube in place LUQ, feeds running at 65 ml/hr.   Lab Results:  Recent Labs    04/23/21 0958 04/25/21 0035  WBC 15.9* 12.0*  HGB 12.6* 11.8*  HCT 38.5* 36.1*  PLT 178 231   BMET Recent Labs    04/23/21 0958 04/25/21 0035  NA 136 138  K 4.1 3.9  CL 107 109  CO2 22 24  GLUCOSE 154* 121*  BUN 11 15  CREATININE 0.84 0.92  CALCIUM 8.1* 8.2*   PT/INR No results for input(s): LABPROT, INR in the last 72 hours. CMP     Component Value Date/Time   NA 138 04/25/2021 0035   K 3.9 04/25/2021 0035   CL 109 04/25/2021 0035   CO2 24 04/25/2021 0035   GLUCOSE 121 (H) 04/25/2021 0035   BUN 15 04/25/2021 0035   CREATININE 0.92 04/25/2021 0035   CALCIUM 8.2 (L) 04/25/2021 0035   PROT 6.6 03/07/2021 0635   ALBUMIN 3.5 03/07/2021 0635   AST 25 03/07/2021 0635   ALT 22 03/07/2021 0635   ALKPHOS 58 03/07/2021 0635    BILITOT 1.0 03/07/2021 0635   GFRNONAA >60 04/25/2021 0035   Lipase  No results found for: LIPASE     Studies/Results: No results found.     Assessment/Plan  70 yo male with gastric outlet obstruction secondary to a benign D1/D2 peptic duodenal stricture, POD6 s/p gastrojejunal bypass and feeding jejunostomy tube. - Begin NG clamping trials today: clamp for 3 hours, then suction for 1 hour, repeat. Place NG back to suction if patient experiences nausea or bloating during clamping periods, place back to suction. - Continue reglan - Continue J tube feeds at goal. Will also keep maintenance fluids on given high NG output. - Multimodal pain control - Ambulating frequently - No crushed medications are to be given via J tube - VTE: lovenox, SCDs - Dispo: inpatient, med-surg    LOS: 6 days    Michaelle Birks, MD [facility] Surgery General, Hepatobiliary and Pancreatic Surgery 04/26/21 6:54 AM

## 2021-04-26 NOTE — Progress Notes (Signed)
Continuing clamping trial   Patient clamped from 0700-1000 No complaints of nausea/vomiting/distention  Patient set to intermittent suctioning 1000-1100 No complaints of nausea/vomiting/distention Total secretions during suctioning is 246ms  Patient is currently sitting in chair. He is clamped for the next three hours  No complaints of nausea/vomiting/distention at this time

## 2021-04-27 LAB — GLUCOSE, CAPILLARY
Glucose-Capillary: 166 mg/dL — ABNORMAL HIGH (ref 70–99)
Glucose-Capillary: 136 mg/dL — ABNORMAL HIGH (ref 70–99)
Glucose-Capillary: 126 mg/dL — ABNORMAL HIGH (ref 70–99)
Glucose-Capillary: 118 mg/dL — ABNORMAL HIGH (ref 70–99)
Glucose-Capillary: 167 mg/dL — ABNORMAL HIGH (ref 70–99)
Glucose-Capillary: 158 mg/dL — ABNORMAL HIGH (ref 70–99)

## 2021-04-27 LAB — BASIC METABOLIC PANEL
Anion gap: 11 (ref 5–15)
BUN: 13 mg/dL (ref 8–23)
CO2: 23 mmol/L (ref 22–32)
Calcium: 9 mg/dL (ref 8.9–10.3)
Chloride: 104 mmol/L (ref 98–111)
Creatinine, Ser: 0.72 mg/dL (ref 0.61–1.24)
GFR, Estimated: >60 mL/min (ref >60)
Glucose, Bld: 147 mg/dL — ABNORMAL HIGH (ref 70–99)
Potassium: 4.3 mmol/L (ref 3.5–5.1)
Sodium: 138 mmol/L (ref 135–145)

## 2021-04-27 LAB — MAGNESIUM: Magnesium: 2.1 mg/dL (ref 1.7–2.4)

## 2021-04-27 MED ORDER — PHENOL 1.4 % MT LIQD
1.0000 | OROMUCOSAL | Status: DC | PRN
  Administered 2021-04-27: 1 via OROMUCOSAL
  Filled 2021-04-27: qty 177

## 2021-04-27 MED ORDER — BISACODYL 10 MG RE SUPP: 10.0000 mg | Freq: Every day | RECTAL | Status: DC | PRN

## 2021-04-27 MED ORDER — POTASSIUM CHLORIDE IN NACL 20-0.45 MEQ/L-% IV SOLN
INTRAVENOUS | Status: DC
  Filled 2021-04-27 (×3): qty 1000

## 2021-04-27 NOTE — Progress Notes (Signed)
    7 Days Post-Op  Subjective: Afebrile, vitals stable. Passing flatus. Tolerating 3 hour clamping trials with 200-300 mL residuals after each clamping period (total NG output 1100 ml). Denies bloating or nausea.   Objective: Vital signs in last 24 hours: Temp:  [97.8 F (36.6 C)-99.3 F (37.4 C)] 98.1 F (36.7 C) (09/05 0355) Pulse Rate:  [93-111] 101 (09/05 0355) Resp:  [16-19] 17 (09/05 0355) BP: (119-149)/(69-85) 129/69 (09/05 0355) SpO2:  [97 %-99 %] 97 % (09/05 0355) Weight:  [94.6 kg] 94.6 kg (09/05 0401) Last BM Date: 04/26/21  Intake/Output from previous day: 09/04 0701 - 09/05 0700 In: 844.5 [I.V.:844.5] Out: 1290 [Urine:100; Emesis/NG output:1190] Intake/Output this shift: Total I/O In: -  Out: 800 [Urine:100; Emesis/NG output:700]  PE: General: resting in bed, NAD Neuro: alert and oriented, no focal deficits HEENT: NG in place, clamped, with bilious fluid in tubing. Resp: normal work of breathing Abdomen: soft, nondistended, nontender to palpation. Midline incision clean and dry with staples in place, no erythema or induration. J tube in place LUQ, feeds running at 65 ml/hr.   Lab Results:  Recent Labs    04/25/21 0035  WBC 12.0*  HGB 11.8*  HCT 36.1*  PLT 231   BMET Recent Labs    04/25/21 0035  NA 138  K 3.9  CL 109  CO2 24  GLUCOSE 121*  BUN 15  CREATININE 0.92  CALCIUM 8.2*   PT/INR No results for input(s): LABPROT, INR in the last 72 hours. CMP     Component Value Date/Time   NA 138 04/25/2021 0035   K 3.9 04/25/2021 0035   CL 109 04/25/2021 0035   CO2 24 04/25/2021 0035   GLUCOSE 121 (H) 04/25/2021 0035   BUN 15 04/25/2021 0035   CREATININE 0.92 04/25/2021 0035   CALCIUM 8.2 (L) 04/25/2021 0035   PROT 6.6 03/07/2021 0635   ALBUMIN 3.5 03/07/2021 0635   AST 25 03/07/2021 0635   ALT 22 03/07/2021 0635   ALKPHOS 58 03/07/2021 0635   BILITOT 1.0 03/07/2021 0635   GFRNONAA >60 04/25/2021 0035   Lipase  No results found  for: LIPASE     Studies/Results: No results found.     Assessment/Plan  70 yo male with gastric outlet obstruction secondary to a benign D1/D2 peptic duodenal stricture, POD7 s/p gastrojejunal bypass and feeding jejunostomy tube. - Lengthen clamping trials to 6 hours today - Continue reglan - Continue J tube feeds at goal. Maintenance IV fluids decreased to 50 ml/hr. - Multimodal pain control - Ambulating frequently - No crushed medications are to be given via J tube - Hyperglycemia: requiring 10-15 units Novolog per day. Patient was previously on Levemir at home, started on 5 units levemir QHS last night. Continue sliding scale q4h. - VTE: lovenox, SCDs - Dispo: inpatient, med-surg    LOS: 7 days    Michaelle Birks, MD [facility] Surgery General, Hepatobiliary and Pancreatic Surgery 04/27/21 6:34 AM

## 2021-04-27 NOTE — Progress Notes (Signed)
No nausea/vomiting/distention noted with clamping trial. New order by Dr. Zenia Resides to increase clamping x 6hrs followed by 1hr suction.   Endorse to incoming nurse

## 2021-04-27 NOTE — Progress Notes (Signed)
Clamping trial    Pt clamped from 0500-1150  Suction 1150-1200  Interrupted by bathroom  Restarted 1315-1400 Total secretions 431m  Patient now clamped until 2000

## 2021-04-28 LAB — GLUCOSE, CAPILLARY
Glucose-Capillary: 119 mg/dL — ABNORMAL HIGH (ref 70–99)
Glucose-Capillary: 153 mg/dL — ABNORMAL HIGH (ref 70–99)
Glucose-Capillary: 88 mg/dL (ref 70–99)
Glucose-Capillary: 108 mg/dL — ABNORMAL HIGH (ref 70–99)
Glucose-Capillary: 185 mg/dL — ABNORMAL HIGH (ref 70–99)

## 2021-04-28 MED ORDER — FREE WATER
200.0000 mL | Status: DC
  Administered 2021-04-28 – 2021-04-30 (×10): 200 mL

## 2021-04-28 MED ORDER — OSMOLITE 1.5 CAL PO LIQD
1600.0000 mL | ORAL | Status: DC
  Administered 2021-04-28 – 2021-04-30 (×4): 1600 mL
  Filled 2021-04-28 (×4): qty 2000

## 2021-04-28 MED ORDER — INSULIN DETEMIR 100 UNIT/ML ~~LOC~~ SOLN
5.0000 [IU] | Freq: Every day | SUBCUTANEOUS | Status: DC
  Administered 2021-04-28 – 2021-04-29 (×2): 5 [IU] via SUBCUTANEOUS
  Filled 2021-04-28 (×3): qty 0.05

## 2021-04-28 MED ORDER — INSULIN DETEMIR 100 UNIT/ML ~~LOC~~ SOLN
8.0000 [IU] | Freq: Every day | SUBCUTANEOUS | Status: DC
  Filled 2021-04-28 (×2): qty 0.08

## 2021-04-28 MED ORDER — FREE WATER: 30.0000 mL | Status: DC

## 2021-04-28 NOTE — Progress Notes (Signed)
Nutrition Follow-up  DOCUMENTATION CODES:   Non-severe (moderate) malnutrition in context of chronic illness  INTERVENTION:   Tube Feeding via J-tube: Transition to nocturnal TF; plan discussed with MD Start with Osmolite 1.5 at 100 ml/hr x 16 hours; if able to tolerate this, can consider changing to 12 or 14 hour feeds at higher rate. Discussed plan with pt and his wife Provides 2400 kcals, 100 g of protein, 1216 mL of free water  Pt will require additional 1200 mL of free water daily to meet hydration needs; 200 mL q 4 hours  NUTRITION DIAGNOSIS:   Moderate Malnutrition related to acute illness as evidenced by energy intake < 75% for > 7 days, percent weight loss.  Being addressed via TF   GOAL:   Patient will meet greater than or equal to 90% of their needs  Progressing  MONITOR:   Labs, Weight trends, Diet advancement, TF tolerance  REASON FOR ASSESSMENT:   Consult Enteral/tube feeding initiation and management  ASSESSMENT:   70 yo male admitted with GOO secondary to benign D1/D2 peptic duodenal stricture. PMH includes DM, GERD, esophageal varices, hepatitis  8/29 Gastrojejunal bypass and J-tube placement  NG tube out, ok for sips of clears. Pt excited to have NG tube out and looking forward to drinking some fluids.   Tolerating Osmolite 1.5 at 65 ml/hr via J-tube  +BM, no nausea, no vomiting  Current wt 92.8 kg weight trending down  Labs: CBGs 118-166 Meds: reglan, ss novolog, levemir   Diet Order:   Diet Order             Diet NPO time specified Except for: Ice Chips, Other (See Comments)  Diet effective now                   EDUCATION NEEDS:   Education needs have been addressed  Skin:  Skin Assessment: Skin Integrity Issues: Skin Integrity Issues:: Incisions Incisions: new surgical J-tube  Last BM:  9/5  Height:   Ht Readings from Last 1 Encounters:  04/20/21 '6\' 3"'$  (1.905 m)    Weight:   Wt Readings from Last 1 Encounters:   04/28/21 92.8 kg    BMI:  Body mass index is 25.57 kg/m.  Estimated Nutritional Needs:   Kcal:  2300-2500 kcals  Protein:  115-130 g  Fluid:  >/= 2 L   Kerman Passey MS, RDN, LDN, CNSC Registered Dietitian III Clinical Nutrition RD Pager and On-Call Pager Number Located in [place]

## 2021-04-28 NOTE — Progress Notes (Signed)
    8 Days Post-Op  Subjective: Afebrile, vitals stable. Having bowel movements. Tolerating NG clamping trials with no bloating or distension.   Objective: Vital signs in last 24 hours: Temp:  [97.8 F (36.6 C)-98.9 F (37.2 C)] 98.9 F (37.2 C) (09/06 0355) Pulse Rate:  [96-106] 96 (09/06 0355) Resp:  [17-18] 17 (09/06 0355) BP: (113-139)/(61-74) 139/72 (09/06 0355) SpO2:  [93 %-99 %] 97 % (09/06 0355) Weight:  [92.8 kg] 92.8 kg (09/06 0356) Last BM Date: 04/27/21  Intake/Output from previous day: 09/05 0701 - 09/06 0700 In: 1427.2 [I.V.:617.2; NG/GT:810] Out: 950 [Emesis/NG output:950] Intake/Output this shift: No intake/output data recorded.  PE: General: resting in bed, NAD Neuro: alert and oriented, no focal deficits HEENT: NG in place, clamped Resp: normal work of breathing Abdomen: soft, nondistended, nontender to palpation. Midline incision clean and dry with staples in place, no erythema or induration. J tube in place LUQ, feeds running at 65 ml/hr.   Lab Results:  No results for input(s): WBC, HGB, HCT, PLT in the last 72 hours.  BMET Recent Labs    04/27/21 0654  NA 138  K 4.3  CL 104  CO2 23  GLUCOSE 147*  BUN 13  CREATININE 0.72  CALCIUM 9.0   PT/INR No results for input(s): LABPROT, INR in the last 72 hours. CMP     Component Value Date/Time   NA 138 04/27/2021 0654   K 4.3 04/27/2021 0654   CL 104 04/27/2021 0654   CO2 23 04/27/2021 0654   GLUCOSE 147 (H) 04/27/2021 0654   BUN 13 04/27/2021 0654   CREATININE 0.72 04/27/2021 0654   CALCIUM 9.0 04/27/2021 0654   PROT 6.6 03/07/2021 0635   ALBUMIN 3.5 03/07/2021 0635   AST 25 03/07/2021 0635   ALT 22 03/07/2021 0635   ALKPHOS 58 03/07/2021 0635   BILITOT 1.0 03/07/2021 0635   GFRNONAA >60 04/27/2021 0654   Lipase  No results found for: LIPASE     Studies/Results: No results found.     Assessment/Plan  70 yo male with gastric outlet obstruction secondary to a benign D1/D2  peptic duodenal stricture, POD8 s/p gastrojejunal bypass and feeding jejunostomy tube. - Remove NG today. [place] for sips of clears. I discussed with the patient that he will have prolonged delayed gastric emptying and will be tube-feed dependent until this resolves, which may take weeks. If he has bloating and gastric distension with NG removal, he will need a venting PEG tube prior to discharge. - Continue reglan - Continue J tube feeds, will discuss transition to nocturnal feeds with nutrition. - Multimodal pain control - Ambulating frequently - No crushed medications are to be given via J tube - Hyperglycemia: increase levemir to 8 units qhs, continue novolog SSI q4h. - VTE: lovenox, SCDs - Dispo: inpatient, med-surg   LOS: 8 days    Michaelle Birks, MD [facility] Surgery General, Hepatobiliary and Pancreatic Surgery 04/28/21 8:01 AM

## 2021-04-28 NOTE — Progress Notes (Signed)
Physical Therapy Treatment and D/C Patient Details Name: Bryan Vargas MRN: [id number] DOB: [birth year] Today's Date: 04/28/2021    History of Present Illness 70yo male who was admitted on 04/16/21 for surgical management of ongoing gastric outlet obstruction secondary to duodenal stricture. Received open gastrojejunostomy on 04/20/21. PMH OA, DM    PT Comments    Pt. Demos mod IND and safety with amb, able to amb multiple laps around NSG floor without difficulty or fatigue.  Pt. And wife feel comfortable with return home with equipment listed below.  No further skilled PT required in acute care.  Pt. Is encouraged to continue to amb in halls with spouse throughout remainder of stay.  Demos understanding.   Follow Up Recommendations  No PT follow up     Equipment Recommendations  Rolling walker with 5" wheels;3in1 (PT);Other (comment) (Shower  chair)    Recommendations for Other Services       Precautions / Restrictions      Mobility  Bed Mobility               General bed mobility comments: Pt. reports no difficulty with getting in and OOB.  No questions/concerns re: bed mobility at home. Patient Response: Cooperative  Transfers   Equipment used: Conservation officer, nature (2 wheeled) Transfers: Sit to/from Stand Sit to Stand: Modified independent (Device/Increase time)         General transfer comment: Able to complete transfers with mod IND.  Good standing balance.  Ambulation/Gait Ambulation/Gait assistance: Modified independent (Device/Increase time) Gait Distance (Feet): 2500 Feet Assistive device: Rolling walker (2 wheeled) Gait Pattern/deviations: Step-through pattern;WFL(Within Functional Limits)     General Gait Details: Amb x 5 laps around NSG unit without any rest breaks.  No SOB or LOB with activity.  Pt. amb in room without AD x 20 ft without any difficulty.   Stairs Stairs:  (Denies need to practice stairs; states there are no stairs he needs to do when he  first returns home.)           Wheelchair Mobility    Modified Rankin (Stroke Patients Only)       Balance Overall balance assessment: Modified Independent                                          Cognition Arousal/Alertness: Awake/alert Behavior During Therapy: WFL for tasks assessed/performed Overall Cognitive Status: Within Functional Limits for tasks assessed                                 General Comments: Pt. agreeable to work with PT.  States he's been amb 5 laps around NSG unit 2x/day without difficulty.      Exercises      General Comments        Pertinent Vitals/Pain Pain Assessment: No/denies pain    Home Living                      Prior Function            PT Goals (current goals can now be found in the care plan section) Acute Rehab PT Goals Patient Stated Goal: Return home. PT Goal Formulation: With patient/family Time For Goal Achievement: 04/28/21 Potential to Achieve Goals: Good Progress towards PT goals: Goals met and updated - see care plan  Frequency           PT Plan Other (comment) (Safe to return home, no further skilled PT required.)    Co-evaluation              AM-PAC PT "6 Clicks" Mobility   Outcome Measure  Help needed turning from your back to your side while in a flat bed without using bedrails?: None Help needed moving from lying on your back to sitting on the side of a flat bed without using bedrails?: None Help needed moving to and from a bed to a chair (including a wheelchair)?: None Help needed standing up from a chair using your arms (e.g., wheelchair or bedside chair)?: None Help needed to walk in hospital room?: None Help needed climbing 3-5 steps with a railing? : A Little 6 Click Score: 23    End of Session   Activity Tolerance: Patient tolerated treatment well Patient left: in chair;with family/visitor present         Time: 6629-4765 PT Time  Calculation (min) (ACUTE ONLY): 30 min  Charges:  $Gait Training: 23-37 mins                     Leshaun Biebel A. Allysha Tryon, PT, DPT Acute Rehabilitation Services Office: [place] 04/28/2021, 2:25 PM

## 2021-04-28 NOTE — TOC Progression Note (Signed)
Transition of Care [facility]) - Progression Note    Patient Details  Name: Bryan Vargas MRN: [id number] Date of Birth: 1951-08-09  Transition of Care [facility]) CM/SW Contact  Jacalyn Lefevre Edson Snowball, RN Phone Number: 04/28/2021, 11:01 AM  Clinical Narrative:     Provided update to [place] with [place]   Expected Discharge Plan: [place]    Expected Discharge Plan and Services Expected Discharge Plan: [place] In-house Referral: Nutrition Discharge Planning Services: CM Consult Post Acute Care Choice: Home Health, Durable Medical Equipment Living arrangements for the past 2 months: Single Family Home                 DME Arranged: 3-N-1, Walker rolling DME Agency: [facility]       HH Arranged: PT, RN HH Agency: [facility] Date [place]: 04/21/21 Time [place]: [phone] Representative spoke with at [place]: [place] ([place]) Interventions    Readmission Risk Interventions No flowsheet data found.

## 2021-04-28 NOTE — Progress Notes (Addendum)
NGT removed per order. Patient tolerated well.  11: 20 am Per MD, modify free water flushes to start with the new TF at the same frequency.

## 2021-04-29 LAB — GLUCOSE, CAPILLARY
Glucose-Capillary: 133 mg/dL — ABNORMAL HIGH (ref 70–99)
Glucose-Capillary: 156 mg/dL — ABNORMAL HIGH (ref 70–99)
Glucose-Capillary: 161 mg/dL — ABNORMAL HIGH (ref 70–99)
Glucose-Capillary: 162 mg/dL — ABNORMAL HIGH (ref 70–99)
Glucose-Capillary: 167 mg/dL — ABNORMAL HIGH (ref 70–99)
Glucose-Capillary: 91 mg/dL (ref 70–99)
Glucose-Capillary: 98 mg/dL (ref 70–99)

## 2021-04-29 MED ORDER — HYDROCORTISONE (PERIANAL) 2.5 % EX CREA
TOPICAL_CREAM | Freq: Two times a day (BID) | CUTANEOUS | Status: DC | PRN
  Filled 2021-04-29 (×2): qty 28.35

## 2021-04-29 NOTE — TOC Progression Note (Addendum)
Transition of Care [facility]) - Progression Note    Patient Details  Name: Bryan Vargas MRN: [id number] Date of Birth: February 23, 1951  Transition of Care [facility]) CM/SW Contact  Jacalyn Lefevre Edson Snowball, RN Phone Number: 04/29/2021, 8:51 AM  Clinical Narrative:     Plan to discharge patient tomorrow to home with tube feeds at 100cc/hr for 16 hours a day.   Orders entered.   Pam with [facility] aware and will provide patient and DEbra more education today.   Dianna with [facility] also aware phone [phone]. Dianna would like tube feed order faxed to her once MD signs. Tube feed order faxed to Dianna at [phone].   Patient aware of all of above   Expected Discharge Plan: [place]    Expected Discharge Plan and Services Expected Discharge Plan: [place] In-house Referral: Nutrition Discharge Planning Services: CM Consult Post Acute Care Choice: Home Health, Durable Medical Equipment Living arrangements for the past 2 months: Single Family Home                 DME Arranged: 3-N-1, Walker rolling DME Agency: [facility]       HH Arranged: PT, RN HH Agency: [facility] Date [place]: 04/21/21 Time [place]: [phone] Representative spoke with at [place]: [place] ([place]) Interventions    Readmission Risk Interventions No flowsheet data found.

## 2021-04-29 NOTE — Progress Notes (Signed)
Brief Nutrition Follow-up:  Pt tolerated Osmolite 1.5 at 100 ml/hr x 16 hours via J-tube. Total volume of 1600 mL of Osmolite 1.5 per day (6.5 cartons per day)  Noted possible discharge tomorrow home with plan for feedings via J-tube with sips of CL Provided patient with "Pump Tube Feeding Instructions" from [facility] to patient and his wife, Hilda Blades. Discussed importance of hand hygiene, use of sterile water as pt on well water, importance of flushing tube well with sterile water before and after each feeding.   Discussed that goal for patient is to receive 1.6 L of formula with additional 1.2 L of water for hydration each day. If pt wants to trial infusing TF at 115 ml/hr over 14 hours to decrease length of time connected to pump, this is ok. If pt has cramping and/or diarrhea then feeding rate is too high/   Discussed options on CL but reinforced small sips only throughout the day and that for now, 100% of nutrition and hydration will be administered via J-tube. Oral intake is for comfort only until further advancement by Dr. Lupita Raider MS, RDN, LDN, [place] Registered Dietitian III Clinical Nutrition RD Pager and On-Call Pager Number Located in [place] '

## 2021-04-29 NOTE — Progress Notes (Signed)
    9 Days Post-Op  Subjective: Afebrile, vitals stable. Tolerating J tube feeds cycled at night. No bloating or nausea.   Objective: Vital signs in last 24 hours: Temp:  [98.2 F (36.8 C)-99.1 F (37.3 C)] 98.2 F (36.8 C) (09/07 0443) Pulse Rate:  [91-98] 91 (09/07 0443) Resp:  [17-19] 17 (09/07 0443) BP: (105-142)/(59-73) 132/67 (09/07 0443) SpO2:  [97 %-99 %] 98 % (09/07 0443) Last BM Date: 04/27/21  Intake/Output from previous day: 09/06 0701 - 09/07 0700 In: 0  Out: 400 [Urine:400] Intake/Output this shift: No intake/output data recorded.  PE: General: resting in bed, NAD Neuro: alert and oriented, no focal deficits Resp: normal work of breathing Abdomen: soft, nondistended, nontender to palpation. Midline incision clean and dry with staples in place, no erythema or induration. J tube in place LUQ, feeds running at 100 ml/hr.   Lab Results:  No results for input(s): WBC, HGB, HCT, PLT in the last 72 hours.  BMET Recent Labs    04/27/21 0654  NA 138  K 4.3  CL 104  CO2 23  GLUCOSE 147*  BUN 13  CREATININE 0.72  CALCIUM 9.0   PT/INR No results for input(s): LABPROT, INR in the last 72 hours. CMP     Component Value Date/Time   NA 138 04/27/2021 0654   K 4.3 04/27/2021 0654   CL 104 04/27/2021 0654   CO2 23 04/27/2021 0654   GLUCOSE 147 (H) 04/27/2021 0654   BUN 13 04/27/2021 0654   CREATININE 0.72 04/27/2021 0654   CALCIUM 9.0 04/27/2021 0654   PROT 6.6 03/07/2021 0635   ALBUMIN 3.5 03/07/2021 0635   AST 25 03/07/2021 0635   ALT 22 03/07/2021 0635   ALKPHOS 58 03/07/2021 0635   BILITOT 1.0 03/07/2021 0635   GFRNONAA >60 04/27/2021 0654   Lipase  No results found for: LIPASE     Studies/Results: No results found.     Assessment/Plan  70 yo male with gastric outlet obstruction secondary to a benign D1/D2 peptic duodenal stricture, POD9 s/p gastrojejunal bypass and feeding jejunostomy tube. - Remain NPO except sips of clear liquids  for comfort. - Continue J tube feeds cycled over 16 hours overnight. Plan to discharge home on this regimen. Patient will need tube feed teaching today. - Ambulating frequently - No crushed medications are to be given via J tube - DM: Levemir decreased to 5u QHS to prevent hypoglycemia during day when feeds are off. - VTE: lovenox, SCDs - Dispo: inpatient, med-surg. Tentative plan for discharge home tomorrow.   LOS: 9 days    Michaelle Birks, MD [facility] Surgery General, Hepatobiliary and Pancreatic Surgery 04/29/21 7:06 AM

## 2021-04-30 ENCOUNTER — Other Ambulatory Visit (HOSPITAL_COMMUNITY): Payer: Self-pay

## 2021-04-30 LAB — GLUCOSE, CAPILLARY
Glucose-Capillary: 133 mg/dL — ABNORMAL HIGH (ref 70–99)
Glucose-Capillary: 135 mg/dL — ABNORMAL HIGH (ref 70–99)
Glucose-Capillary: 136 mg/dL — ABNORMAL HIGH (ref 70–99)
Glucose-Capillary: 83 mg/dL (ref 70–99)

## 2021-04-30 MED ORDER — LEVEMIR FLEXTOUCH 100 UNIT/ML ~~LOC~~ SOPN: 5.0000 [IU] | PEN_INJECTOR | Freq: Every day | SUBCUTANEOUS | 11 refills | Status: DC

## 2021-04-30 MED ORDER — OXYCODONE HCL 5 MG/5ML PO SOLN
5.0000 mg | ORAL | 0 refills | Status: DC | PRN
  Filled 2021-04-30: qty 50, 2d supply, fill #0

## 2021-04-30 NOTE — Care Management (Addendum)
Dianna with [facility]  phone [phone] aware discharge is today. Once discharge summary complete will fax to  [place] at o Dianna at [phone].   Pam with [facility] also aware discharge today, teaching completed.

## 2021-04-30 NOTE — Progress Notes (Signed)
Removed pt staples. Total of 25. 5th staple from top and very bottom staple difficult to remove and bled a small amount. Edges of midline still approximated.  Covered midline with dry gauze and tape to protect it during travel home.

## 2021-04-30 NOTE — Progress Notes (Signed)
[place] Citizen to be D/C'd per MD order. Discussed with the patient and all questions fully answered. ? VSS, Skin clean, dry and intact without evidence of skin break down, no evidence of skin tears noted. Midline incision staples removed by primary nurse. ? IV catheter discontinued intact. Site without signs and symptoms of complications. Dressing and pressure applied. ? An After Visit Summary was printed and given to the patient. Patient informed where to pickup prescriptions. ? D/c education completed with patient/family including follow up instructions, medication list, d/c activities limitations if indicated, with other d/c instructions as indicated by MD - patient able to verbalize understanding, all questions fully answered.  ? Patient instructed to return to ED, call 911, or call MD for any changes in condition.  ? Patient to be escorted via [place], and D/C home via private auto.

## 2021-04-30 NOTE — Discharge Instructions (Addendum)
[facility] DISCHARGE INSTRUCTIONS  Activity No heavy lifting greater than 10 pounds for 6 weeks after surgery. Ok to shower, but do not bathe or submerge incisions underwater. Do not drive while taking narcotic pain medication.  Wound Care You may shower and allow warm soapy water to run over your incision. Gently pat dry. Do not submerge your incision underwater. Monitor your incision for any new redness, tenderness, or drainage.  Feeding Tube Care Be sure to flush your feeding tube before and after each tube feeding cycle, and before and after giving any medications through the tube. Do NOT put any tablets or crushed medications down your feeding tube - this will clog the tube. You should give ONLY liquid medications through the feeding tube.  Diet You may consume small amounts of clear liquids by mouth. You will need to continue tube feeds at home. These can be run overnight (rate of 100 ml/hr for 16 hours).  When to Call us: Fever greater than 100.5 New redness, drainage, or swelling at incision site Severe pain, nausea, vomiting, or bloating Jaundice (yellowing of the whites of the eyes or skin) Clogged feeding tube  Follow-up You will have a follow up appointment with Dr. Zenia Resides in about 2 weeks. Please call the office if you have any questions or concerns before your appointment. This will be at the [facility] Surgery office at [address], [place], [place], [place]. Please arrive at least 15 minutes prior to your scheduled appointment time.  For questions or concerns, please call the office at [phone].

## 2021-05-01 ENCOUNTER — Telehealth: Payer: Self-pay

## 2021-05-01 NOTE — Telephone Encounter (Signed)
-----   Message from Irving Copas., MD sent at 05/01/2021  8:13 AM EDT ----- Wynn Maudlin, Thanks for letting us know. Dr. Zenia Resides will let them know how much Carafate to continue if she feels necessary. Dhana Totton, do set up a follow up in clinic in 6-8 weeks, because he will need a Colonoscopy at some point this year. Thanks. GM ----- Message ----- From: Noralyn Pick, NP Sent: 04/30/2021  11:50 AM EDT To: Timothy Lasso, RN, Irving Copas., MD  Dr. Rush [facility], I received a call from Zacarias Pontes nurse regarding this patient.  Apparently he is being discharged home today and the wife is needing instructions regarding her Carafate that he previously prescribed as an outpatient.  Can you please have Ajit Errico contact the patient with any further Carafate instructions if continued he needs a refill.  Thanks

## 2021-05-01 NOTE — Telephone Encounter (Signed)
07/01/21 at 1150 am appt with GM.  Letter mailed to the pt with this appt

## 2021-05-01 NOTE — Discharge Summary (Signed)
Physician Discharge Summary   Patient ID: Bryan Vargas [id number] y.o. 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 He underwent an EGD at an outside facility which showed a proximal duodenal stricture as well as antral gastric ulcers. He underwent a dilation of the stricture and started on PPI therapy. Repeat EGD showed healing of the ulcers but a persistent duodenal stricture. He underwent repeated further dilations over the next few weeks with persistence of symptoms. Multiple biopsies of the stricture were benign, consistent with peptic duodenitis. He was subsequently referred for surgical evaluation and agreed to proceed with a GJ bypass.   Hospital Course: The patient was admitted on 04/20/21 and taken to the OR for exploratory laparotomy, gastrojejunal bypass and feeding jejunostomy placement. Postoperatively he was admitted to the floor in stable condition. An NG tube was left in place for gastric decompression. He was started on trophic J tube feeds on POD1. He worked with physical therapy and was able to ambulate multiple times daily with good progression in mobility. His J tube feeds were slowly advanced to goal when he began having bowel function, which he tolerated. His NG output remained high and the NG was left in place to allow continued gastric decompression, given the significant gastric distension he had intraop. Once he was tolerating feeds at goal, he was transitioned to cycled nighttime feeds over 16 hours per day. He tolerated this without  bloating or distension. He started NG clamping trials on POD6, and the length of clamping was slowly increased. He tolerating clamping with no nausea or bloating, and his NG tube was removed on POD8. He continued to tolerate nocturnal feeds and had no nausea or distension after removal of his NG tube. By POD10 he was ambulating, tolerating feeds, and pain was controlled. He was examined and deemed appropriate for discharge home on J tube feeds, with sips of clear liquids by mouth for comfort. Prolonged delayed gastric emptying is anticipated given the degree of gastric distension and duration of his obstruction prior to surgery. He will return to clinic in 2 weeks for follow up. He was discharged on Levemir 5 units qhs and instructed to follow up with his PCP for ongoing blood sugar monitoring with his tube feeds. Staples were removed prior to discharge.  Consults: None  Significant Diagnostic Studies: N/A  Treatments: IV hydration, surgery: GJ bypass and feeding J tube, and tube feeds  Discharge Exam: General: resting comfortably, NAD Neuro: alert and oriented, no focal deficits Resp: normal work of breathing on room air Abdomen: soft, nondistended, nontender to palpation. Midline incision clean and dry with no erythema or induration. Extremities: warm and well-perfused   Disposition: Discharge disposition: 06-Home-Health Care Svc       Patient Instructions:  Allergies as of 04/30/2021   No Known Allergies      Medication List     STOP taking these medications    Januvia 100 MG tablet Generic drug: sitaGLIPtin       TAKE these medications    aspirin EC 81 MG tablet Take 81 mg by mouth daily. Swallow whole.   azelastine 0.1 %  nasal spray Commonly known as: ASTELIN Place 1 spray into both nostrils 2 (two) times daily as needed for rhinitis.   CENTRUM ADULTS PO Take 1 tablet by mouth daily.   finasteride 5 MG tablet Commonly known as: PROSCAR Take 1 tablet (5 mg  total) by mouth daily.   latanoprost 0.005 % ophthalmic solution Commonly known as: XALATAN Place 1 drop into both eyes at bedtime.   Levemir FlexTouch 100 UNIT/ML FlexPen Generic drug: insulin detemir Inject 5 Units into the skin at bedtime. What changed: how much to take   oxyCODONE 5 MG/5ML solution Commonly known as: ROXICODONE Place 5 mLs (5 mg total) into feeding tube every 4 (four) hours as needed for severe pain.   pantoprazole 40 MG tablet Commonly known as: Protonix Take 1 tablet (40 mg total) by mouth 2 (two) times daily.   sucralfate 1 GM/10ML suspension Commonly known as: Carafate Take 10 mLs (1 g total) by mouth 4 (four) times daily.   tamsulosin 0.4 MG Caps capsule Commonly known as: FLOMAX Take 1 capsule (0.4 mg total) by mouth daily after supper.       Activity: no driving while on analgesics and no heavy lifting for 6 weeks Diet: clear liquids, J tube feeds Wound Care: keep wound clean and dry  Follow-up with Dr. Zenia Resides in 2 weeks.  Signed: Dwan Bolt 05/01/2021 10:43 AM

## 2021-05-14 ENCOUNTER — Ambulatory Visit: Payer: Medicare Other | Admitting: Urology

## 2021-06-04 ENCOUNTER — Telehealth: Payer: Self-pay | Admitting: Gastroenterology

## 2021-06-04 NOTE — Telephone Encounter (Signed)
Pt has upcoming appt with Dr. Jerilynn Mages. 11/9 at 9:50. He was seen at [facility] and prescribed Pantoprazole, 40 mg, twice a day and needs a refill of this medication. Please advise.  You may reach patient at [phone], and it is OK to leave a voicemail if he does not answer.

## 2021-06-05 NOTE — Telephone Encounter (Signed)
Is it okay to send prescription?  I am not sure that we have seen the patient in office, but he scheduled for 07/01/21. Please advise.

## 2021-06-08 ENCOUNTER — Telehealth: Payer: Self-pay | Admitting: Gastroenterology

## 2021-06-08 MED ORDER — PANTOPRAZOLE SODIUM 40 MG PO TBEC: 40.0000 mg | DELAYED_RELEASE_TABLET | Freq: Two times a day (BID) | ORAL | 2 refills | Status: DC

## 2021-06-08 NOTE — Telephone Encounter (Signed)
Inbound call from Bryan Vargas stating that the pt has not received his prescription for pantoprazole. Please advise. Thank you.

## 2021-06-08 NOTE — Telephone Encounter (Signed)
Prescription for Pantoprazole 40mg  - twice daily has been sent to [facility]. Pt's wife has been informed.

## 2021-06-15 ENCOUNTER — Other Ambulatory Visit: Payer: Self-pay

## 2021-06-15 ENCOUNTER — Other Ambulatory Visit: Payer: Medicare Other

## 2021-06-15 ENCOUNTER — Telehealth: Payer: Self-pay

## 2021-06-15 DIAGNOSIS — R350 Frequency of micturition: Secondary | ICD-10-CM

## 2021-06-15 LAB — MICROSCOPIC EXAMINATION: Renal Epithel, UA: NONE SEEN /hpf

## 2021-06-15 LAB — URINALYSIS, ROUTINE W REFLEX MICROSCOPIC
Bilirubin, UA: NEGATIVE
Glucose, UA: NEGATIVE
Ketones, UA: NEGATIVE
Nitrite, UA: POSITIVE — AB
Specific Gravity, UA: 1.025 (ref 1.005–1.030)
Urobilinogen, Ur: 1 mg/dL (ref 0.2–1.0)
pH, UA: 6 (ref 5.0–7.5)

## 2021-06-15 NOTE — Telephone Encounter (Signed)
Patient called this am with UTI symptoms after completing an at home urine test that showed positive.  Patient will come by office today and to urine sample.

## 2021-06-16 ENCOUNTER — Encounter: Payer: Self-pay | Admitting: Urology

## 2021-06-19 ENCOUNTER — Other Ambulatory Visit: Payer: Self-pay | Admitting: Urology

## 2021-06-19 DIAGNOSIS — N3000 Acute cystitis without hematuria: Secondary | ICD-10-CM

## 2021-06-19 LAB — URINE CULTURE

## 2021-06-19 MED ORDER — NITROFURANTOIN MONOHYD MACRO 100 MG PO CAPS: 100.0000 mg | ORAL_CAPSULE | Freq: Two times a day (BID) | ORAL | 0 refills | Status: DC

## 2021-06-26 ENCOUNTER — Other Ambulatory Visit (HOSPITAL_COMMUNITY): Payer: Self-pay

## 2021-07-01 ENCOUNTER — Encounter: Payer: Self-pay | Admitting: Gastroenterology

## 2021-07-01 ENCOUNTER — Ambulatory Visit (INDEPENDENT_AMBULATORY_CARE_PROVIDER_SITE_OTHER): Payer: Medicare Other | Admitting: Gastroenterology

## 2021-07-01 VITALS — BP 120/60 | HR 87 | Ht 75.0 in | Wt 208.0 lb

## 2021-07-01 DIAGNOSIS — Z980 Intestinal bypass and anastomosis status: Secondary | ICD-10-CM | POA: Diagnosis not present

## 2021-07-01 DIAGNOSIS — Z8719 Personal history of other diseases of the digestive system: Secondary | ICD-10-CM

## 2021-07-01 DIAGNOSIS — K315 Obstruction of duodenum: Secondary | ICD-10-CM

## 2021-07-01 DIAGNOSIS — R935 Abnormal findings on diagnostic imaging of other abdominal regions, including retroperitoneum: Secondary | ICD-10-CM

## 2021-07-01 DIAGNOSIS — Z862 Personal history of diseases of the blood and blood-forming organs and certain disorders involving the immune mechanism: Secondary | ICD-10-CM

## 2021-07-01 DIAGNOSIS — Z1211 Encounter for screening for malignant neoplasm of colon: Secondary | ICD-10-CM

## 2021-07-01 DIAGNOSIS — R194 Change in bowel habit: Secondary | ICD-10-CM

## 2021-07-01 DIAGNOSIS — K862 Cyst of pancreas: Secondary | ICD-10-CM | POA: Insufficient documentation

## 2021-07-01 DIAGNOSIS — K2100 Gastro-esophageal reflux disease with esophagitis, without bleeding: Secondary | ICD-10-CM

## 2021-07-01 MED ORDER — NA SULFATE-K SULFATE-MG SULF 17.5-3.13-1.6 GM/177ML PO SOLN: 1.0000 | Freq: Once | ORAL | 0 refills | Status: AC

## 2021-07-01 NOTE — Progress Notes (Signed)
GASTROENTEROLOGY OUTPATIENT CLINIC VISIT   Primary Care Provider Zachery Dauer, MD [address] [place] [phone]   Patient Profile: Bryan Vargas is a 70 y.o. male with a pmh significant for medically controlled CAD (on aspirin), prediabetes, nephrolithiasis, arthritis, GERD with esophagitis, duodenal ulcer leading to stricturing leading to side to side antecolic gastrojejunostomy, pancreatic cyst.  The patient presents to the [facility] for an evaluation and management of problem(s) noted below:  Problem List 1. Colon cancer screening   2. History of duodenal ulcer   3. Duodenal stricture   4. History of bypass gastrojejunostomy   5. Gastroesophageal reflux disease with esophagitis without hemorrhage   6. Pancreatic cyst   7. Abnormal CT scan, pelvis   8. History of anemia   9. Altered bowel habits     History of Present Illness This is a patient that I met back in August and September would been referred for consideration of endoscopic dilation of severe duodenal stricturing.  I performed 3 separate dilations but very quickly the patient had recurrent obstruction with each of his first 2 dilations that I knew after the third dilation it was unlikely we were going to be able to endoscopically manage this.  He was referred to Dr. Sophronia Simas who performed an antecolic side-to-side gastrojejunostomy for bypass.  Patient did well.  Since his surgery he has noted an alteration of his bowel habits as he is going more frequently with the stools being semiformed to formed.  They are getting more formed on a daily basis.  He is not having any nausea or vomiting.  His weight has been increasing slightly.  The patient was initially referred for colonoscopy earlier this year after a CT scan had been performed that showed some lymphadenopathy in the pelvis and a colonoscopy was recommended.  He had previously had a colonoscopy 9 to 10 years ago that was normal per  report.  He did not undergo the colonoscopy as a result of everything that occurred this summer.  Now that he is better he is ready to move forward with things.  He notes no blood in his stools.  He is not having significant abdominal pain.  The patient continues to take PPI and is not experiencing significant heartburn symptoms currently.  He denies any dysphagia symptoms.  He had a scab that was in the site of his G-tube that has recently fallen off but there is no bleeding from that site.  GI Review of Systems Positive as above Negative for odynophagia, bloating, pain  Review of Systems General: Denies fevers/chills Cardiovascular: Denies chest pain/palpitations Pulmonary: Denies shortness of breath Gastroenterological: See HPI Genitourinary: Denies darkened urine or hematuria Hematological: Denies easy bruising/bleeding Endocrine: Denies temperature intolerance Dermatological: Denies jaundice Psychological: Mood is stable   Medications Current Outpatient Medications  Medication Sig Dispense Refill   aspirin EC 81 MG tablet Take 81 mg by mouth daily. Swallow whole.     azelastine (ASTELIN) 0.1 % nasal spray Place 1 spray into both nostrils 2 (two) times daily as needed for rhinitis.     finasteride (PROSCAR) 5 MG tablet Take 1 tablet (5 mg total) by mouth daily. 90 tablet 3   latanoprost (XALATAN) 0.005 % ophthalmic solution Place 1 drop into both eyes at bedtime.     Multiple Vitamins-Minerals (CENTRUM ADULTS PO) Take 1 tablet by mouth daily.     pantoprazole (PROTONIX) 40 MG tablet Take 1 tablet (40 mg total) by mouth 2 (two) times  daily. 60 tablet 2   tamsulosin (FLOMAX) 0.4 MG CAPS capsule Take 1 capsule (0.4 mg total) by mouth daily after supper. 30 capsule 11   No current facility-administered medications for this visit.    Allergies No Known Allergies  Histories Past Medical History:  Diagnosis Date   Arthritis    Asthma    child   Coronary artery calcification seen  on CAT scan 09/2020   Diabetes (HCC)    GERD (gastroesophageal reflux disease)    Gout    Hepatitis    at age 73   History of kidney stones    passed   Past Surgical History:  Procedure Laterality Date   arthritis     BALLOON DILATION N/A 03/02/2021   Procedure: BALLOON DILATION;  Surgeon: Daneil Dolin, MD;  Location: [facility];  Service: Gastroenterology;  Laterality: N/A;   BALLOON DILATION N/A 04/03/2021   Procedure: BALLOON DILATION;  Surgeon: Rush Landmark Telford Nab., MD;  Location: [facility];  Service: Gastroenterology;  Laterality: N/A;   BALLOON DILATION N/A 04/09/2021   Procedure: BALLOON DILATION;  Surgeon: Rush Landmark Telford Nab., MD;  Location: [facility];  Service: Gastroenterology;  Laterality: N/A;   BIOPSY  02/27/2021   Procedure: BIOPSY;  Surgeon: Rogene Houston, MD;  [facility];  Service: Endoscopy;;   BIOPSY  03/02/2021   Procedure: BIOPSY;  Surgeon: Daneil Dolin, MD;  Location: [facility];  Service: Gastroenterology;;   BIOPSY  03/23/2021   Procedure: BIOPSY;  Surgeon: Irving Copas., MD;  Location: [facility];  Service: Gastroenterology;;   CYSTOSCOPY W/ URETERAL STENT PLACEMENT     ESOPHAGEAL DILATION N/A 03/18/2021   Procedure: ESOPHAGEAL DILATION;  Surgeon: Rogene Houston, MD;  [facility];  Service: Endoscopy;  Laterality: N/A;   ESOPHAGOGASTRODUODENOSCOPY (EGD) WITH PROPOFOL N/A 02/27/2021   Procedure: ESOPHAGOGASTRODUODENOSCOPY (EGD) WITH PROPOFOL;  Surgeon: Rogene Houston, MD;  [facility];  Service: Endoscopy;  Laterality: N/A;   ESOPHAGOGASTRODUODENOSCOPY (EGD) WITH PROPOFOL N/A 03/02/2021   Procedure: ESOPHAGOGASTRODUODENOSCOPY (EGD) WITH PROPOFOL;  Surgeon: Daneil Dolin, MD;  Location: [facility];  Service: Gastroenterology;  Laterality: N/A;  to be done in OR per anesthesia   ESOPHAGOGASTRODUODENOSCOPY (EGD) WITH PROPOFOL N/A 03/18/2021   Procedure: ESOPHAGOGASTRODUODENOSCOPY (EGD) WITH PROPOFOL;  Surgeon:  Rogene Houston, MD;  [facility];  Service: Endoscopy;  Laterality: N/A;  1:05   ESOPHAGOGASTRODUODENOSCOPY (EGD) WITH PROPOFOL N/A 03/23/2021   Procedure: ESOPHAGOGASTRODUODENOSCOPY (EGD) WITH PROPOFOL;  Surgeon: Rush Landmark Telford Nab., MD;  Location: [facility];  Service: Gastroenterology;  Laterality: N/A;   ESOPHAGOGASTRODUODENOSCOPY (EGD) WITH PROPOFOL N/A 04/03/2021   Procedure: ESOPHAGOGASTRODUODENOSCOPY (EGD) WITH PROPOFOL;  Surgeon: Rush Landmark Telford Nab., MD;  Location: [facility];  Service: Gastroenterology;  Laterality: N/A;   ESOPHAGOGASTRODUODENOSCOPY (EGD) WITH PROPOFOL N/A 04/09/2021   Procedure: ESOPHAGOGASTRODUODENOSCOPY (EGD) WITH PROPOFOL;  Surgeon: Rush Landmark Telford Nab., MD;  Location: [facility];  Service: Gastroenterology;  Laterality: N/A;   EXTRACORPOREAL SHOCK WAVE LITHOTRIPSY     EXTRACORPOREAL SHOCK WAVE LITHOTRIPSY     FOREIGN BODY REMOVAL  03/23/2021   Procedure: FOREIGN BODY REMOVAL;  Surgeon: Rush Landmark Telford Nab., MD;  Location: [facility];  Service: Gastroenterology;;   FOREIGN BODY REMOVAL  04/09/2021   Procedure: FOREIGN BODY REMOVAL;  Surgeon: Irving Copas., MD;  Location: [facility];  Service: Gastroenterology;;   Johann Capers N/A 04/20/2021   Procedure: OPEN GASTROJEJUNOSTOMY Bypass with feeding jejunostomy tube placement;  Surgeon: Dwan Bolt, MD;  Location: [facility];  Service: General;  Laterality: N/A;   IMPACTION REMOVAL  04/03/2021   Procedure: IMPACTION REMOVAL;  Surgeon: Rush Landmark Telford Nab., MD;  Location: [facility];  Service: Gastroenterology;;   PROSTATE BIOPSY     TONSILLECTOMY     Social History   Socioeconomic History   Marital status: Single    Spouse name: Not on file   Number of children: 2   Years of education: Not on file   Highest education level: Not on file  Occupational History   Occupation: retired  Tobacco Use   Smoking status: Never   Smokeless tobacco: Never  Vaping Use    Vaping Use: Never used  Substance and Sexual Activity   Alcohol use: Not Currently    Comment: ocassional - beer   Drug use: Never   Sexual activity: Not on file  Other Topics Concern   Not on file  Social History Narrative   Not on file   Social Determinants of Health   Financial Resource Strain: Not on file  Food Insecurity: Not on file  Transportation Needs: Not on file  Physical Activity: Not on file  Stress: Not on file  Social Connections: Not on file  Intimate Partner Violence: Not on file   Family History  Problem Relation Age of Onset   Cancer Brother        patient can't remember what kind   Colon cancer Neg Hx    Celiac disease Neg Hx    Inflammatory bowel disease Neg Hx    Esophageal cancer Neg Hx    Liver disease Neg Hx    Pancreatic cancer Neg Hx    Rectal cancer Neg Hx    Stomach cancer Neg Hx    I have reviewed his medical, social, and family history in detail and updated the electronic medical record as necessary.    PHYSICAL EXAMINATION  BP 120/60   Pulse 87   Ht $R'6\' 3"'MQ$  (1.905 m)   Wt 208 lb (94.3 kg)   BMI 26.00 kg/m  Wt Readings from Last 3 Encounters:  07/01/21 208 lb (94.3 kg)  04/28/21 204 lb 9.4 oz (92.8 kg)  04/09/21 220 lb (99.8 kg)  GEN: NAD, appears stated age, doesn't appear chronically ill, accompanied by wife PSYCH: Cooperative, without pressured speech EYE: Conjunctivae pink, sclerae anicteric ENT: MMM CV: Nontachycardic RESP: No audible wheezing GI: NABS, soft, rounded, large midline surgical scar present, G-tube scar site healing, no rebound or guarding MSK/EXT: No lower extremity edema SKIN: No jaundice NEURO:  Alert & Oriented x 3, no focal deficits   REVIEW OF DATA  I reviewed the following data at the time of this encounter:  GI Procedures and Studies  Multiple endoscopies performed within the last few months Last EGD was August 2022 - No gross lesions in esophagus proximally. LA Grade C esophagitis with no  bleeding distally. - Retained gastric fluid - suctioned out 4100 mL today. - Dilation deformity of the entire stomach. - Erythematous mucosa in the stomach. - Retained food in the duodenum - removed. - Acquired duodenal stenosis at apex leading into D1/D2 angle. Dilated with appropriate mucosal wrenting.  Reviewed those in epic and care everywhere  Laboratory Studies  Reviewed those in epic and care everywhere  Imaging Studies  February 2022 CT hematuria work-up IMPRESSION: 1. Markedly enlarged prostate indents the bladder. Peripheral bladder calcifications. Punctate stones in a severely atrophic right kidney. Right hydronephrosis without definitive cause identified. Distal left ureter is poorly opacified, limiting evaluation. No additional  findings to explain the patient's hematuria. 2. Numerous subcentimeter perirectal lymph nodes. Consider correlation with colonoscopy, as clinically indicated. 3. Scattered sclerotic foci in the spine and pelvis are nonspecific. Difficult to exclude metastatic disease. 4. Steatotic enlarged liver. 5. 10 x 14 mm low-attenuation pancreatic tail lesion may represent a pseudocyst if there is a history of pancreatitis. Cystic pancreatic neoplasm cannot be excluded. Follow-up CT abdomen without and with contrast in 2 years could be performed in further evaluation, as clinically indicated. This recommendation follows ACR consensus guidelines: Management of Incidental Pancreatic Cysts: A White Paper of the ACR Incidental Findings Committee. J Am Coll Radiol 2706;23:762-831. 6. Aortic atherosclerosis (ICD10-I70.0). Coronary artery calcification.  July 2022 CT abdomen pelvis without contrast IMPRESSION: Markedly distended stomach. Gastroparesis or gastric outlet obstruction cannot be excluded. Stable markedly enlarged prostate and findings of chronic bladder outlet obstruction. Tiny less than 5 mm urinary bladder calculi. No evidence of ureteral calculi or  hydronephrosis. 2 adjacent lesions in pancreatic tail, largest measuring 2.5 cm, which cannot be characterized on this unenhanced exam. Pancreas protocol abdomen MRI or CT without and with contrast recommended for further characterization  Right upper quadrant elastography ULTRASOUND HEPATIC ELASTOGRAPHY: Median kPa:  3.0 Diagnostic category:  < or = 5 kPa: high probability of being normal  July 2022 CT abdomen with contrast IMPRESSION: 1. Markedly fluid distended stomach. There is abrupt narrowing of the descending portion of the duodenum. Findings are generally in keeping with reported duodenal stricture. There is no extrinsic abnormality such as compressing mass evident. 2. There are two fluid attenuation lesions of the tip of the pancreatic tail measuring up to 2.0 cm. These are slightly enlarged in comparison to examination dated 09/30/2020, not significantly changed compared to more recent CT. 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 Recommend multiphasic contrast enhanced MRI in 6 months to ensure stability. EUS/FNA can alternately be considered at this time. 3. Markedly atrophic right kidney, in keeping with prior obstructive, infectious, or ischemic insult. 4. Hepatic steatosis. Aortic Atherosclerosis (ICD10-I70.0).   ASSESSMENT  Mr. Bryan Vargas is a 70 y.o. male with a pmh significant for medically controlled CAD (on aspirin), prediabetes, nephrolithiasis, arthritis, GERD with esophagitis, duodenal ulcer leading to stricturing leading to side to side antecolic gastrojejunostomy, pancreatic cyst.  The patient is seen today for evaluation and management of:  1. Colon cancer screening   2. History of duodenal ulcer   3. Duodenal stricture   4. History of bypass gastrojejunostomy   5. Gastroesophageal reflux disease with esophagitis without hemorrhage   6. Pancreatic cyst   7. Abnormal CT scan,  pelvis   8. History of anemia   9. Altered bowel habits    The patient is hemodynamically stable.  Clinically he is doing very well since his bypass.  The patient's underlying etiology for the severe stricturing and ulceration is not completely defined but multiple biopsies showed no evidence of mass or lesion and cross-sectional imaging did not show that either.  At this point we will continue the patient on his current trajectory.  He had previously undergone a CT scan earlier this year in February that had suggested lymphadenopathy in the pelvic region for which a colonoscopy was recommended.  We will move forward with getting that colonoscopy scheduled for truly colon cancer screening due to the time/length from his last procedure of 10 years.  He has had some alteration of his bowel habits but that has barely been since his  gastrojejunostomy not sure that secondary biopsies of the colon are necessary unless something is significantly altered in the mucosa at this time.  An upper endoscopy for follow-up of his significant grade C esophagitis to ensure healing and no evidence of Barrett's esophagus is recommended as well and that will be performed at the same time.  The patient has imaging findings suggestive of pancreatic cysts/IPMN's.  We will plan to reevaluate these via MRI/MRCP in February/March 2023 6 months after his last imaging study.  He will continue PPI therapy currently hopefully we can start decreasing his dosing.  We will see how his anemia looks when he gets repeat laboratories done by his urologist in the coming week.  The risks and benefits of endoscopic evaluation were discussed with the patient; these include but are not limited to the risk of perforation, infection, bleeding, missed lesions, lack of diagnosis, severe illness requiring hospitalization, as well as anesthesia and sedation related illnesses.  The patient and/or family is agreeable to proceed.  All patient questions were  answered to the best of my ability, and the patient agrees to the aforementioned plan of action with follow-up as indicated.   PLAN  Continue Protonix 40 twice daily for now Proceed with scheduling endoscopy to ensure healing of esophagitis and rule out Barrett's Proceed with scheduling screening colonoscopy Laboratories as outlined below MRI/MRCP in February/March 2023 to follow-up pancreatic cyst Bulking fiber with Benefiber or Metamucil daily to twice daily can be considered   Orders Placed This Encounter  Procedures   CBC   IBC + Ferritin   Ambulatory referral to Gastroenterology    New Prescriptions   No medications on file   Modified Medications   No medications on file    Planned Follow Up No follow-ups on file.   Total Time in Face-to-Face and in Coordination of Care for patient including independent/personal interpretation/review of prior testing, medical history, examination, medication adjustment, communicating results with the patient directly, and documentation within the EHR is 25 minutes.   Justice Britain, MD [facility]oscopy Office # [phone]

## 2021-07-01 NOTE — Patient Instructions (Signed)
We have sent the following medications to your pharmacy for you to pick up at your convenience: Suprep   Please have your Urologist to fax lab results to [phone] attn: Dr. Rush Landmark   Lab order has been placed in Epic and written order given to you today also.   You have been scheduled for a colonoscopy. Please follow written instructions given to you at your visit today.  Please pick up your prep supplies at the pharmacy within the next 1-3 days. If you use inhalers (even only as needed), please bring them with you on the day of your procedure.  If you are age 10 or older, your body mass index should be between 23-30. Your Body mass index is 26 kg/m. If this is out of the aforementioned range listed, please consider follow up with your Primary Care Provider.  ________________________________________________________  The [facility] providers would like to encourage you to use [facility] to communicate with providers for non-urgent requests or questions.  Due to long hold times on the telephone, sending your provider a message by [facility] may be a faster and more efficient way to get a response.  Please allow 48 business hours for a response.  Please remember that this is for non-urgent requests.   Thank you for choosing me and [facility].  Dr. Rush Landmark

## 2021-07-02 DIAGNOSIS — R194 Change in bowel habit: Secondary | ICD-10-CM | POA: Insufficient documentation

## 2021-07-02 DIAGNOSIS — R935 Abnormal findings on diagnostic imaging of other abdominal regions, including retroperitoneum: Secondary | ICD-10-CM | POA: Insufficient documentation

## 2021-07-02 DIAGNOSIS — Z980 Intestinal bypass and anastomosis status: Secondary | ICD-10-CM | POA: Insufficient documentation

## 2021-07-15 ENCOUNTER — Other Ambulatory Visit: Payer: Medicare Other

## 2021-07-15 ENCOUNTER — Other Ambulatory Visit: Payer: Self-pay | Admitting: Gastroenterology

## 2021-07-15 ENCOUNTER — Other Ambulatory Visit: Payer: Self-pay

## 2021-07-15 DIAGNOSIS — R972 Elevated prostate specific antigen [PSA]: Secondary | ICD-10-CM

## 2021-07-16 LAB — PSA, TOTAL AND FREE
PSA, Free Pct: 25 %
PSA, Free: 1.35 ng/mL
Prostate Specific Ag, Serum: 5.4 ng/mL — ABNORMAL HIGH (ref 0.0–4.0)

## 2021-07-16 LAB — FERRITIN: Ferritin: 100 ng/mL (ref 30–400)

## 2021-07-16 LAB — CBC WITH DIFFERENTIAL/PLATELET
Basophils Absolute: 0.1 10*3/uL (ref 0.0–0.2)
Basos: 1 %
EOS (ABSOLUTE): 0.3 10*3/uL (ref 0.0–0.4)
Eos: 4 %
Hematocrit: 38.5 % (ref 37.5–51.0)
Hemoglobin: 12.7 g/dL — ABNORMAL LOW (ref 13.0–17.7)
Immature Grans (Abs): 0 10*3/uL (ref 0.0–0.1)
Immature Granulocytes: 0 %
Lymphocytes Absolute: 1.6 10*3/uL (ref 0.7–3.1)
Lymphs: 21 %
MCH: 30.2 pg (ref 26.6–33.0)
MCHC: 33 g/dL (ref 31.5–35.7)
MCV: 92 fL (ref 79–97)
Monocytes Absolute: 0.5 10*3/uL (ref 0.1–0.9)
Monocytes: 7 %
Neutrophils Absolute: 4.9 10*3/uL (ref 1.4–7.0)
Neutrophils: 67 %
Platelets: 307 10*3/uL (ref 150–450)
RBC: 4.2 x10E6/uL (ref 4.14–5.80)
RDW: 13.7 % (ref 11.6–15.4)
WBC: 7.4 10*3/uL (ref 3.4–10.8)

## 2021-07-16 LAB — IRON AND TIBC
Iron Saturation: 19 % (ref 15–55)
Iron: 54 ug/dL (ref 38–169)
Total Iron Binding Capacity: 277 ug/dL (ref 250–450)
UIBC: 223 ug/dL (ref 111–343)

## 2021-07-17 ENCOUNTER — Other Ambulatory Visit: Payer: Medicare Other

## 2021-07-20 ENCOUNTER — Telehealth: Payer: Self-pay

## 2021-07-20 DIAGNOSIS — D509 Iron deficiency anemia, unspecified: Secondary | ICD-10-CM

## 2021-07-20 DIAGNOSIS — Z1211 Encounter for screening for malignant neoplasm of colon: Secondary | ICD-10-CM

## 2021-07-20 DIAGNOSIS — K2100 Gastro-esophageal reflux disease with esophagitis, without bleeding: Secondary | ICD-10-CM

## 2021-07-20 DIAGNOSIS — K222 Esophageal obstruction: Secondary | ICD-10-CM

## 2021-07-20 DIAGNOSIS — Z8719 Personal history of other diseases of the digestive system: Secondary | ICD-10-CM

## 2021-07-20 NOTE — Telephone Encounter (Signed)
EGD added. New amb referral sent with updated dx's.

## 2021-07-20 NOTE — Telephone Encounter (Signed)
-----   Message from Irving Copas., MD sent at 07/01/2021  5:37 PM EST ----- Regarding: Add on EGD Tyshea Imel, As I am finalizing notes, the patient did have pretty significant esophagitis that I need to follow-up to ensure he does not have Barrett's. Please add on EGD for his currently scheduled date (okay to just leave it at 130 with a double procedure). EGD diagnosis is GERD with erosive esophagitis. Thanks. GM

## 2021-07-23 ENCOUNTER — Ambulatory Visit (INDEPENDENT_AMBULATORY_CARE_PROVIDER_SITE_OTHER): Payer: Medicare Other | Admitting: Urology

## 2021-07-23 ENCOUNTER — Encounter: Payer: Self-pay | Admitting: Urology

## 2021-07-23 ENCOUNTER — Other Ambulatory Visit: Payer: Self-pay

## 2021-07-23 VITALS — BP 151/71 | HR 76

## 2021-07-23 DIAGNOSIS — N401 Enlarged prostate with lower urinary tract symptoms: Secondary | ICD-10-CM | POA: Diagnosis not present

## 2021-07-23 DIAGNOSIS — Z87442 Personal history of urinary calculi: Secondary | ICD-10-CM

## 2021-07-23 DIAGNOSIS — R972 Elevated prostate specific antigen [PSA]: Secondary | ICD-10-CM | POA: Diagnosis not present

## 2021-07-23 DIAGNOSIS — N281 Cyst of kidney, acquired: Secondary | ICD-10-CM

## 2021-07-23 DIAGNOSIS — N210 Calculus in bladder: Secondary | ICD-10-CM | POA: Diagnosis not present

## 2021-07-23 DIAGNOSIS — N261 Atrophy of kidney (terminal): Secondary | ICD-10-CM

## 2021-07-23 DIAGNOSIS — R350 Frequency of micturition: Secondary | ICD-10-CM

## 2021-07-23 DIAGNOSIS — N138 Other obstructive and reflux uropathy: Secondary | ICD-10-CM

## 2021-07-23 LAB — MICROSCOPIC EXAMINATION: Renal Epithel, UA: NONE SEEN /hpf

## 2021-07-23 LAB — URINALYSIS, ROUTINE W REFLEX MICROSCOPIC
Bilirubin, UA: NEGATIVE
Glucose, UA: NEGATIVE
Ketones, UA: NEGATIVE
Nitrite, UA: NEGATIVE
Specific Gravity, UA: 1.02 (ref 1.005–1.030)
Urobilinogen, Ur: 0.2 mg/dL (ref 0.2–1.0)
pH, UA: 6 (ref 5.0–7.5)

## 2021-07-23 MED ORDER — TAMSULOSIN HCL 0.4 MG PO CAPS: 0.4000 mg | ORAL_CAPSULE | Freq: Every day | ORAL | 3 refills | Status: AC

## 2021-07-23 MED ORDER — FINASTERIDE 5 MG PO TABS: 5.0000 mg | ORAL_TABLET | Freq: Every day | ORAL | 3 refills | Status: AC

## 2021-07-23 NOTE — Progress Notes (Signed)
07/23/2021 9:40 PM   Bryan Vargas 07/02/1951 [id number]  Referring provider: Quentin Cornwall, MD [place],  [place]  No chief complaint on file.   HPI:  1. BPH with urinary obstruction  2. Urinary frequency  3. Elevated PSA  4. Bladder stones  5. Microhematuria     07/23/21: Bryan Vargas returns today in f/u for his history of BPH with BOO and retention.  He remains on tamsulosin and finasteride.    He remains on tamsulosin and finasteride and his IPSS is 3 today.  He has nocturia x 1.  He has a 360ml prostate.  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  He didn't have retention after that procedure.   His PSA has fallen further on finasteride to 5.4 with a 25% f/t ratio.  I was 9.9 in 2/22.   He thinks he passed a stone since his last visit and his CT in 7/22 did show some bladder stones.   He has severe right renal atrophy and multiple left renal cysts.  No renal stones were seen.   03/09/21: Bryan Vargas returns and underwent EGD 03/02/21. He had a foley placed for retention and then removed. He had dysuria and started nitrofurantoin 03/06/21 but urine cx was negative x 2. Ultimately the foley was replaced 03/07/21 with > 1 L in bladder. He presents late this afternoon for void trial.  He was constipated but now having BMs. On 5ari monotherapy.    01/15/21: Bryan Vargas returns today in f/u.   He has BPH with a 353ml prostate and small renal stones.  He remains on tamsulosin and was given finasteride at his last visit. He has had no side effects with the med.  He has had no hematuria.  He has a reduced stream in the AM with some intermittency.   His PSA fell from 9.9 with a 36.5% f/t ratio to 6.9 with a 34.8% f/t ratio over the last 3 months on the finasteride.  His IPSS is 4.   His UA has >30 WBC and 3-10 RBC with few bacteria.  This is stable.     10/16/20: Bryan Vargas returns today in f/u with CT results.  He has right renal  atrophy with punctate stones.  He has left renal cysts.  His prostate is very large with intravesical protrusion and tiny bladder stones but no obvious mass. His prostate measures about 358ml.   He is voiding well with a reduced stream.  His IPSS is 5.  His urine culture was negative on 09/04/20.  His UA has >30 WBC and many bacteria but no only 0-2 RBC's.  He remains on tamsulosin but hasn't notice improvement with that.   Bryan Vargas is a 70 yo make with a history of stones.  He last passed a stone about 3 months ago.  He currently has no symptoms but his UA today has >30 WBC's, 11-30 RBC's and mod bacteria.  He has had some gross hematuria intermittently over the last 2 years.  He was treated with antibiotics when he last bled 3 months ago and the bleeding clear.  He stays hydrated.  His IPSS is 5 with nocturia x .   He has had ESWL x 2 and ureteroscopy x 1.  He had an 8x37mm stone and was stented in 2015.   His prior care has been in [place].  He has no dysuria or flank pain at this  time.    He is currently on tamsulosin and has been on it for a year.   He has had prior prostate biopsies with the last I find in 2008 for a PSA of 4.69.  His prostate was 65.64ml and the biopsy was negative and has had cystoscopy.   PMH: Past Medical History:  Diagnosis Date   Arthritis    Asthma    child   Coronary artery calcification seen on CAT scan 09/2020   Diabetes (HCC)    GERD (gastroesophageal reflux disease)    Gout    Hepatitis    at age 30   History of kidney stones    passed    Surgical History: Past Surgical History:  Procedure Laterality Date   arthritis     BALLOON DILATION N/A 03/02/2021   Procedure: BALLOON DILATION;  Surgeon: Daneil Dolin, MD;  [facility]: [facility];  Service: Gastroenterology;  Laterality: N/A;   BALLOON DILATION N/A 04/03/2021   Procedure: BALLOON DILATION;  Surgeon: Rush Landmark Telford Nab., MD;  Location: [facility];  Service: Gastroenterology;  Laterality: N/A;   BALLOON  DILATION N/A 04/09/2021   Procedure: BALLOON DILATION;  Surgeon: Rush Landmark Telford Nab., MD;  Location: [facility];  Service: Gastroenterology;  Laterality: N/A;   BIOPSY  02/27/2021   Procedure: BIOPSY;  Surgeon: Rogene Houston, MD;  [facility];  Service: Endoscopy;;   BIOPSY  03/02/2021   Procedure: BIOPSY;  Surgeon: Daneil Dolin, MD;  Location: [facility];  Service: Gastroenterology;;   BIOPSY  03/23/2021   Procedure: BIOPSY;  Surgeon: Irving Copas., MD;  Location: [facility];  Service: Gastroenterology;;   CYSTOSCOPY W/ URETERAL STENT PLACEMENT     ESOPHAGEAL DILATION N/A 03/18/2021   Procedure: ESOPHAGEAL DILATION;  Surgeon: Rogene Houston, MD;  [facility];  Service: Endoscopy;  Laterality: N/A;   ESOPHAGOGASTRODUODENOSCOPY (EGD) WITH PROPOFOL N/A 02/27/2021   Procedure: ESOPHAGOGASTRODUODENOSCOPY (EGD) WITH PROPOFOL;  Surgeon: Rogene Houston, MD;  [facility];  Service: Endoscopy;  Laterality: N/A;   ESOPHAGOGASTRODUODENOSCOPY (EGD) WITH PROPOFOL N/A 03/02/2021   Procedure: ESOPHAGOGASTRODUODENOSCOPY (EGD) WITH PROPOFOL;  Surgeon: Daneil Dolin, MD;  [facility]: [facility];  Service: Gastroenterology;  Laterality: N/A;  to be done in OR per anesthesia   ESOPHAGOGASTRODUODENOSCOPY (EGD) WITH PROPOFOL N/A 03/18/2021   Procedure: ESOPHAGOGASTRODUODENOSCOPY (EGD) WITH PROPOFOL;  Surgeon: Rogene Houston, MD;  [facility];  Service: Endoscopy;  Laterality: N/A;  1:05   ESOPHAGOGASTRODUODENOSCOPY (EGD) WITH PROPOFOL N/A 03/23/2021   Procedure: ESOPHAGOGASTRODUODENOSCOPY (EGD) WITH PROPOFOL;  Surgeon: Rush Landmark Telford Nab., MD;  Location: [facility];  Service: Gastroenterology;  Laterality: N/A;   ESOPHAGOGASTRODUODENOSCOPY (EGD) WITH PROPOFOL N/A 04/03/2021   Procedure: ESOPHAGOGASTRODUODENOSCOPY (EGD) WITH PROPOFOL;  Surgeon: Rush Landmark Telford Nab., MD;  Location: [facility];  Service: Gastroenterology;  Laterality: N/A;    ESOPHAGOGASTRODUODENOSCOPY (EGD) WITH PROPOFOL N/A 04/09/2021   Procedure: ESOPHAGOGASTRODUODENOSCOPY (EGD) WITH PROPOFOL;  Surgeon: Rush Landmark Telford Nab., MD;  Location: [facility];  Service: Gastroenterology;  Laterality: N/A;   EXTRACORPOREAL SHOCK WAVE LITHOTRIPSY     EXTRACORPOREAL SHOCK WAVE LITHOTRIPSY     FOREIGN BODY REMOVAL  03/23/2021   Procedure: FOREIGN BODY REMOVAL;  Surgeon: Rush Landmark Telford Nab., MD;  Location: [facility];  Service: Gastroenterology;;   FOREIGN BODY REMOVAL  04/09/2021   Procedure: FOREIGN BODY REMOVAL;  Surgeon: Irving Copas., MD;  Location: [facility];  Service: Gastroenterology;;   GASTROJEJUNOSTOMY N/A 04/20/2021   Procedure: OPEN GASTROJEJUNOSTOMY Bypass with feeding jejunostomy  tube placement;  Surgeon: Dwan Bolt, MD;  Location: [facility];  Service: General;  Laterality: N/A;   IMPACTION REMOVAL  04/03/2021   Procedure: IMPACTION REMOVAL;  Surgeon: Irving Copas., MD;  Location: [facility];  Service: Gastroenterology;;   PROSTATE BIOPSY     TONSILLECTOMY      Home Medications:  Allergies as of 07/23/2021   No Known Allergies      Medication List        Accurate as of July 23, 2021  9:40 PM. If you have any questions, ask your nurse or doctor.          aspirin EC 81 MG tablet Take 81 mg by mouth daily. Swallow whole.   azelastine 0.1 % nasal spray Commonly known as: ASTELIN Place 1 spray into both nostrils 2 (two) times daily as needed for rhinitis.   CENTRUM ADULTS PO Take 1 tablet by mouth daily.   finasteride 5 MG tablet Commonly known as: PROSCAR Take 1 tablet (5 mg total) by mouth daily.   latanoprost 0.005 % ophthalmic solution Commonly known as: XALATAN Place 1 drop into both eyes at bedtime.   pantoprazole 40 MG tablet Commonly known as: Protonix Take 1 tablet (40 mg total) by mouth 2 (two) times daily.   tamsulosin 0.4 MG Caps capsule Commonly known as: FLOMAX Take 1 capsule (0.4 mg  total) by mouth daily after supper.        Allergies: No Known Allergies  Family History: Family History  Problem Relation Age of Onset   Cancer Brother        patient can't remember what kind   Colon cancer Neg Hx    Celiac disease Neg Hx    Inflammatory bowel disease Neg Hx    Esophageal cancer Neg Hx    Liver disease Neg Hx    Pancreatic cancer Neg Hx    Rectal cancer Neg Hx    Stomach cancer Neg Hx     Social History:  reports that he has never smoked. He has never used smokeless tobacco. He reports that he does not currently use alcohol. He reports that he does not use drugs.   Physical Exam: BP (!) 151/71   Pulse 76   Constitutional:  Alert and oriented, No acute distress. HEENT: [place] AT, moist mucus membranes.  Trachea midline, no masses. Cardiovascular: No clubbing, cyanosis, or edema. Respiratory: Normal respiratory effort, no increased work of breathing. GU: No CVA tenderness; Foley in place - urine clear  Skin: No rashes, bruises or suspicious lesions. Neurologic: Grossly intact, no focal deficits, moving all 4 extremities. Psychiatric: Normal mood and affect.  Laboratory Data: Lab Results  Component Value Date   WBC 7.4 07/15/2021   HGB 12.7 (L) 07/15/2021   HCT 38.5 07/15/2021   MCV 92 07/15/2021   PLT 307 07/15/2021    Lab Results  Component Value Date   CREATININE 0.72 04/27/2021    No results found for: PSA  No results found for: TESTOSTERONE  Lab Results  Component Value Date   HGBA1C 5.8 (H) 02/27/2021    Urinalysis  Results for orders placed or performed in visit on 07/23/21 (from the past 24 hour(s))  Urinalysis, Routine w reflex microscopic     Status: Abnormal   Collection Time: 07/23/21  4:00 PM  Result Value Ref Range   Specific Gravity, UA 1.020 1.005 - 1.030   pH, UA 6.0 5.0 - 7.5   Color, UA Amber (A) Yellow   Appearance  Ur Clear Clear   Leukocytes,UA 2+ (A) Negative   Protein,UA 1+ (A) Negative/Trace   Glucose, UA  Negative Negative   Ketones, UA Negative Negative   RBC, UA Trace (A) Negative   Bilirubin, UA Negative Negative   Urobilinogen, Ur 0.2 0.2 - 1.0 mg/dL   Nitrite, UA Negative Negative   Microscopic Examination See below:    Narrative   Performed at:  [facility] [address] [place], [place]  [id number] Lab Director: Mina Marble MT, Phone:  [phone]  Microscopic Examination     Status: Abnormal   Collection Time: 07/23/21  4:00 PM   Urine  Result Value Ref Range   WBC, UA 11-30 (A) 0 - 5 /hpf   RBC 0-2 0 - 2 /hpf   Epithelial Cells (non renal) 0-10 0 - 10 /hpf   Renal Epithel, UA None seen None seen /hpf   Crystals Present N/A   Crystal Type Amorphous Sediment N/A   Mucus, UA Present Not Estab.   Bacteria, UA Few None seen/Few   Narrative   Performed at:  [facility] [address] [place], [place]  [id number] Lab Director: [place], Phone:  [phone]      Pertinent Imaging: N/a No results found for this or any previous visit.  No results found for this or any previous visit.  No results found for this or any previous visit.  No results found for this or any previous visit.  Results for orders placed during the hospital encounter of 02/25/21  US RENAL  Narrative CLINICAL DATA:  Acute kidney injury, history diabetes mellitus, coronary artery disease  EXAM: RENAL / URINARY TRACT ULTRASOUND COMPLETE  COMPARISON:  CT abdomen pelvis without contrast 02/25/2021  FINDINGS: Right Kidney:  Renal measurements: 5.8 x 3.6 x 2.1 cm = volume: 23 mL. Small and atrophic with thinned echogenic cortex. No mass or hydronephrosis.  Left Kidney:  Renal measurements: 17.6 x 7.4 x 7.5 cm = volume: 509 mL. Normal cortical thickness. Increased cortical echogenicity. Exophytic cyst at lower kidney 6.2 x 5.6 x 6.1 cm. Additional simple cyst at lower kidney 4.2 x 3.4 x 3.4 cm. Multiple additional smaller cysts are identified.  Total of 11 cysts are noted. No hydronephrosis or shadowing calcification.  Bladder:  Mild bladder wall thickening.  Other:  Significantly enlarged prostate gland extending into the bladder base, prostate gland measuring 6.6 x 8.9 x 10.5 cm a calculated volume of 323 mL.  IMPRESSION: Atrophic RIGHT kidney.  Medical renal disease lytic changes LEFT kidney with multiple cysts up to 6.2 cm diameter.  Markedly enlarged prostate gland with associated bladder wall thickening which may reflect chronic outlet obstruction.   Electronically Signed By: Lavonia Dana M.D. On: 02/26/2021 17:16  No results found for this or any previous visit.  Results for orders placed during the hospital encounter of 09/30/20  CT HEMATURIA WORKUP  Narrative CLINICAL DATA:  Gross hematuria, history of renal stones.  EXAM: CT ABDOMEN AND PELVIS WITHOUT AND WITH CONTRAST  TECHNIQUE: Multidetector CT imaging of the abdomen and pelvis was performed following the standard protocol before and following the bolus administration of intravenous contrast.  CONTRAST:  155mL OMNIPAQUE IOHEXOL 300 MG/ML  SOLN  COMPARISON:  None.  FINDINGS: Lower chest: Image quality is degraded by respiratory motion. Lung bases are grossly clear. Coronary artery calcification. Heart size normal. No pericardial or pleural effusion. Distal esophagus is grossly unremarkable.  Hepatobiliary: Liver is decreased in attenuation diffusely  and is enlarged, measuring 20.0 cm. Liver and gallbladder are otherwise unremarkable. No biliary ductal dilatation.  Pancreas: 10 x 14 mm low-attenuation lesion in the tail the pancreas (7/30). Otherwise unremarkable.  Spleen: Negative.  Adrenals/Urinary Tract: Adrenal glands are unremarkable. Right kidney is severely atrophic with dilatation of the right ureter. There may be punctate stones in the right kidney. Multiple low-attenuation lesions in the left kidney measure up to 6.2  cm. Those measuring greater than 1.5 cm in size are consistent with cysts. Others are too small to definitively characterize but statistically, cysts are likely. Subcentimeter hyperdense lesion in the anterior interpolar left kidney (2/40), too small to characterize. No left renal or ureteric stones. Left ureter is decompressed. Poor excretion of contrast in the right kidney, limiting evaluation. Distal left ureter is poorly opacified, also limiting evaluation. Otherwise, no filling defects in the opacified portions of the left intrarenal collecting system and left ureter. There are peripheral calcifications in the posterolateral aspects of bladder bilaterally. Bladder is indented by a markedly enlarged prostate.  Stomach/Bowel: Stomach, small bowel, appendix and colon are unremarkable.  Vascular/Lymphatic: Atherosclerotic calcification of the aorta. There are several perirectal lymph nodes measuring up to 6 mm (7/81).  Reproductive: Prostate is markedly enlarged and indents the bladder.  Other: No free fluid. Mesenteries and peritoneum are otherwise unremarkable.  Musculoskeletal: There are scattered sclerotic lesions in the spine and pelvis, nonspecific. Degenerative changes in the spine.  IMPRESSION: 1. Markedly enlarged prostate indents the bladder. Peripheral bladder calcifications. Punctate stones in a severely atrophic right kidney. Right hydronephrosis without definitive cause identified. Distal left ureter is poorly opacified, limiting evaluation. No additional findings to explain the patient's hematuria. 2. Numerous subcentimeter perirectal lymph nodes. Consider correlation with colonoscopy, as clinically indicated. 3. Scattered sclerotic foci in the spine and pelvis are nonspecific. Difficult to exclude metastatic disease. 4. Steatotic enlarged liver. 5. 10 x 14 mm low-attenuation pancreatic tail lesion may represent a pseudocyst if there is a history of  pancreatitis. Cystic pancreatic neoplasm cannot be excluded. Follow-up CT abdomen without and with contrast in 2 years could be performed in further evaluation, as clinically indicated. This recommendation follows ACR consensus guidelines: Management of Incidental Pancreatic Cysts: A White Paper of the ACR Incidental Findings Committee. J Am Coll Radiol 9563;87:564-332. 6. Aortic atherosclerosis (ICD10-I70.0). Coronary artery calcification.   Electronically Signed By: Lorin Picket M.D. On: 10/01/2020 10:31  No results found for this or any previous visit.   Assessment & Plan:    1. BPH with urinary obstruction and bladder stones Cont finasteride and  tamsulosin.  Disc nature r/b/a to alpha blockers.  Were he to need surgical intervention, it would have to be a simple prostatectomy.   He may have passed one of the stones but they are small.   2. Elevated PSA.  His PSA has continued to fall on finasteride.  I will repeat in 6 months.   3.  Severe right renal atrophy.  4.  Multiple left renal cysts.   Return in about 6 months (around 01/21/2022) for with PSA.  Irine Seal, MD  [facility]  [address] [place], [place] [address] [phone]  Patient ID: Bryan Vargas, male   DOB: [birth year], 70 y.o.   MRN: [id number]

## 2021-07-23 NOTE — Progress Notes (Signed)
Urological Symptom Review  Patient is experiencing the following symptoms: Frequent urination Get up at night to urinate Weak stream   Review of Systems  Gastrointestinal (upper)  : Negative for upper GI symptoms  Gastrointestinal (lower) : Negative for lower GI symptoms  Constitutional : Weight loss  Skin: Negative for skin symptoms  Eyes: Negative for eye symptoms  Ear/Nose/Throat : Negative for Ear/Nose/Throat symptoms  Hematologic/Lymphatic: Negative for Hematologic/Lymphatic symptoms  Cardiovascular : Negative for cardiovascular symptoms  Respiratory : Negative for respiratory symptoms  Endocrine: Negative for endocrine symptoms  Musculoskeletal: Negative for musculoskeletal symptoms  Neurological: Negative for neurological symptoms  Psychologic: Negative for psychiatric symptoms

## 2021-07-24 ENCOUNTER — Telehealth: Payer: Self-pay

## 2021-07-24 NOTE — Telephone Encounter (Signed)
-----   Message from Irving Copas., MD sent at 07/24/2021  3:17 PM EST ----- Regarding: RE: [facility] pt Ellyanna Holton, Please see Osvaldo Angst notation below. Please let the patient know. Patient needs to be rescheduled for the hospital. Please offer him the week of Christmas for me to do his procedure or it will be next year based on our availability. Let me know what date he chooses. Thanks. GM ----- Message ----- From: Osvaldo Angst, CRNA Sent: 07/24/2021   2:33 PM EST To: Irving Copas., MD Subject: [facility] pt                                         Dr Rush Landmark,  This pt is cheduled with you on Dec 7.  He has limited mouth opening and is a difficult intubation so his procedure will need to be done at the hospital.  Thanks,  Osvaldo Angst

## 2021-07-24 NOTE — Telephone Encounter (Signed)
Left message on machine to call back  

## 2021-07-25 LAB — URINE CULTURE

## 2021-07-27 ENCOUNTER — Other Ambulatory Visit: Payer: Self-pay

## 2021-07-27 DIAGNOSIS — Z8719 Personal history of other diseases of the digestive system: Secondary | ICD-10-CM

## 2021-07-27 DIAGNOSIS — K222 Esophageal obstruction: Secondary | ICD-10-CM

## 2021-07-27 DIAGNOSIS — K209 Esophagitis, unspecified without bleeding: Secondary | ICD-10-CM

## 2021-07-27 DIAGNOSIS — K2100 Gastro-esophageal reflux disease with esophagitis, without bleeding: Secondary | ICD-10-CM

## 2021-07-27 DIAGNOSIS — D509 Iron deficiency anemia, unspecified: Secondary | ICD-10-CM

## 2021-07-27 DIAGNOSIS — Z1211 Encounter for screening for malignant neoplasm of colon: Secondary | ICD-10-CM

## 2021-07-27 NOTE — Telephone Encounter (Signed)
Spoke with pt wife she is aware colon endo cancelled in the [facility] and will need to be moved to the hospital. The pt does not want to do the procedures until next year.  Appt made for 10/01/21 at 830 am at [facility] with GM.  Pt has prep at home. New instructions mailed.

## 2021-07-27 NOTE — Telephone Encounter (Signed)
Patient returned your call. Please return his call.  Thank you.

## 2021-07-29 ENCOUNTER — Encounter: Payer: Medicare Other | Admitting: Gastroenterology

## 2021-09-03 ENCOUNTER — Other Ambulatory Visit: Payer: Self-pay | Admitting: Gastroenterology

## 2021-09-23 ENCOUNTER — Telehealth: Payer: Self-pay

## 2021-09-23 ENCOUNTER — Encounter (HOSPITAL_COMMUNITY): Payer: Self-pay | Admitting: Gastroenterology

## 2021-09-23 NOTE — Telephone Encounter (Signed)
Left message on machine to call back  

## 2021-09-23 NOTE — Telephone Encounter (Signed)
-----   Message from Timothy Lasso, RN sent at 03/23/2021  9:14 AM EDT ----- Recommend a MRI/MRCP in 6 months to define Griffeth a bit more and ensure no interval changes in size that could require endoscopic ultrasound but most likely would be monitored with imaging.

## 2021-09-23 NOTE — Progress Notes (Signed)
Attempted to obtain medical history via telephone, unable to reach at this time. I left a voicemail to return pre surgical testing department's phone call.  

## 2021-09-24 NOTE — Telephone Encounter (Signed)
I spoke with the pt's wife and she declines to make appt at this time.  The pt has an appt next week for endo colon and she will discuss with GM at that time if the MRI is still needed since his surgery in August 22

## 2021-09-30 ENCOUNTER — Telehealth: Payer: Self-pay | Admitting: Gastroenterology

## 2021-09-30 NOTE — Telephone Encounter (Signed)
Inbound call from patient wife. States patient have come down with a cough, no fever or any other symptoms. Would like to know if he should cancel and reschedule procedure at hospital 2/9.

## 2021-09-30 NOTE — Telephone Encounter (Signed)
Patty, I saw this right now. Not ideal but hopefully will be able to be scheduled as soon as April comes out.  Please place recall for 59-months if he hasn't already rescheduled. I just sent you a message about adding our other patient to that opening at the end of the day. GM

## 2021-09-30 NOTE — Telephone Encounter (Signed)
The pt called to cancel his colon endo for tomorrow at the hospital with GM.  He woke up with a continuous cough.  I have cancelled and will add him to the list to reschedule when the April schedule opens up.  FYI Dr Rush Landmark I moved the last two patients up.

## 2021-09-30 NOTE — Telephone Encounter (Signed)
I have added the pt on the list to be rescheduled with the April schedule comes out

## 2021-10-01 ENCOUNTER — Ambulatory Visit (HOSPITAL_COMMUNITY): Admission: RE | Admit: 2021-10-01 | Payer: Medicare Other | Source: Ambulatory Visit | Admitting: Gastroenterology

## 2021-10-01 ENCOUNTER — Other Ambulatory Visit: Payer: Self-pay

## 2021-10-01 ENCOUNTER — Encounter (HOSPITAL_COMMUNITY): Admission: RE | Payer: Self-pay | Source: Ambulatory Visit

## 2021-10-01 ENCOUNTER — Telehealth: Payer: Self-pay

## 2021-10-01 DIAGNOSIS — D509 Iron deficiency anemia, unspecified: Secondary | ICD-10-CM

## 2021-10-01 DIAGNOSIS — K2100 Gastro-esophageal reflux disease with esophagitis, without bleeding: Secondary | ICD-10-CM

## 2021-10-01 DIAGNOSIS — K222 Esophageal obstruction: Secondary | ICD-10-CM

## 2021-10-01 DIAGNOSIS — Z8719 Personal history of other diseases of the digestive system: Secondary | ICD-10-CM

## 2021-10-01 DIAGNOSIS — Z1211 Encounter for screening for malignant neoplasm of colon: Secondary | ICD-10-CM

## 2021-10-01 SURGERY — COLONOSCOPY WITH PROPOFOL
Anesthesia: Monitor Anesthesia Care

## 2021-10-01 NOTE — Telephone Encounter (Signed)
Colon Endo rescheduled, pt and wife instructed and medications reviewed.  Patient instructions mailed to home and sent to My Chart.  Patient to call with any questions or concerns. He still has his prep solution and does not need another prescription.

## 2021-10-01 NOTE — Telephone Encounter (Signed)
Left message on machine to call back  

## 2021-10-01 NOTE — Telephone Encounter (Signed)
Endo colon has been scheduled for 11/29/21 at [facility] with GM at 1145 am

## 2021-10-01 NOTE — Telephone Encounter (Signed)
Patients wife's returned your call, please advise.

## 2021-11-18 ENCOUNTER — Encounter (HOSPITAL_COMMUNITY): Payer: Self-pay | Admitting: Gastroenterology

## 2021-11-18 NOTE — Progress Notes (Signed)
Attempted to obtain medical history via telephone, unable to reach at this time. I left a voicemail to return pre surgical testing department's phone call.  

## 2021-11-26 ENCOUNTER — Ambulatory Visit (HOSPITAL_COMMUNITY)
Admission: RE | Admit: 2021-11-26 | Discharge: 2021-11-26 | Disposition: A | Discharge status: Home / Self Care | Payer: Medicare Other | Attending: Gastroenterology | Admitting: Gastroenterology

## 2021-11-26 ENCOUNTER — Encounter (HOSPITAL_COMMUNITY): Payer: Self-pay | Admitting: Gastroenterology

## 2021-11-26 ENCOUNTER — Ambulatory Visit (HOSPITAL_BASED_OUTPATIENT_CLINIC_OR_DEPARTMENT_OTHER): Payer: Medicare Other | Admitting: Anesthesiology

## 2021-11-26 ENCOUNTER — Other Ambulatory Visit: Payer: Self-pay

## 2021-11-26 ENCOUNTER — Ambulatory Visit (HOSPITAL_COMMUNITY): Payer: Medicare Other | Admitting: Anesthesiology

## 2021-11-26 ENCOUNTER — Encounter (HOSPITAL_COMMUNITY)
Admission: RE | Disposition: A | Discharge status: Home / Self Care | Payer: Self-pay | Source: Home / Self Care | Attending: Gastroenterology

## 2021-11-26 DIAGNOSIS — E119 Type 2 diabetes mellitus without complications: Secondary | ICD-10-CM | POA: Insufficient documentation

## 2021-11-26 DIAGNOSIS — D123 Benign neoplasm of transverse colon: Secondary | ICD-10-CM | POA: Diagnosis not present

## 2021-11-26 DIAGNOSIS — I2510 Atherosclerotic heart disease of native coronary artery without angina pectoris: Secondary | ICD-10-CM | POA: Insufficient documentation

## 2021-11-26 DIAGNOSIS — D122 Benign neoplasm of ascending colon: Secondary | ICD-10-CM | POA: Diagnosis not present

## 2021-11-26 DIAGNOSIS — D509 Iron deficiency anemia, unspecified: Secondary | ICD-10-CM

## 2021-11-26 DIAGNOSIS — K5730 Diverticulosis of large intestine without perforation or abscess without bleeding: Secondary | ICD-10-CM | POA: Diagnosis not present

## 2021-11-26 DIAGNOSIS — M1990 Unspecified osteoarthritis, unspecified site: Secondary | ICD-10-CM | POA: Insufficient documentation

## 2021-11-26 DIAGNOSIS — K222 Esophageal obstruction: Secondary | ICD-10-CM

## 2021-11-26 DIAGNOSIS — Z980 Intestinal bypass and anastomosis status: Secondary | ICD-10-CM | POA: Diagnosis not present

## 2021-11-26 DIAGNOSIS — K315 Obstruction of duodenum: Secondary | ICD-10-CM | POA: Diagnosis not present

## 2021-11-26 DIAGNOSIS — Z1211 Encounter for screening for malignant neoplasm of colon: Secondary | ICD-10-CM

## 2021-11-26 DIAGNOSIS — K2289 Other specified disease of esophagus: Secondary | ICD-10-CM

## 2021-11-26 DIAGNOSIS — D128 Benign neoplasm of rectum: Secondary | ICD-10-CM | POA: Diagnosis not present

## 2021-11-26 DIAGNOSIS — K219 Gastro-esophageal reflux disease without esophagitis: Secondary | ICD-10-CM | POA: Diagnosis not present

## 2021-11-26 DIAGNOSIS — K642 Third degree hemorrhoids: Secondary | ICD-10-CM | POA: Insufficient documentation

## 2021-11-26 DIAGNOSIS — K2100 Gastro-esophageal reflux disease with esophagitis, without bleeding: Secondary | ICD-10-CM

## 2021-11-26 DIAGNOSIS — K2980 Duodenitis without bleeding: Secondary | ICD-10-CM | POA: Insufficient documentation

## 2021-11-26 DIAGNOSIS — D124 Benign neoplasm of descending colon: Secondary | ICD-10-CM | POA: Diagnosis not present

## 2021-11-26 DIAGNOSIS — K3189 Other diseases of stomach and duodenum: Secondary | ICD-10-CM

## 2021-11-26 DIAGNOSIS — K2270 Barrett's esophagus without dysplasia: Secondary | ICD-10-CM | POA: Diagnosis not present

## 2021-11-26 DIAGNOSIS — D125 Benign neoplasm of sigmoid colon: Secondary | ICD-10-CM | POA: Diagnosis not present

## 2021-11-26 DIAGNOSIS — D120 Benign neoplasm of cecum: Secondary | ICD-10-CM | POA: Diagnosis not present

## 2021-11-26 DIAGNOSIS — Z8719 Personal history of other diseases of the digestive system: Secondary | ICD-10-CM

## 2021-11-26 HISTORY — PX: ESOPHAGOGASTRODUODENOSCOPY (EGD) WITH PROPOFOL: SHX5813

## 2021-11-26 HISTORY — PX: SUBMUCOSAL TATTOO INJECTION: SHX6856

## 2021-11-26 HISTORY — PX: SUBMUCOSAL LIFTING INJECTION: SHX6855

## 2021-11-26 HISTORY — PX: COLONOSCOPY WITH PROPOFOL: SHX5780

## 2021-11-26 HISTORY — PX: POLYPECTOMY: SHX5525

## 2021-11-26 HISTORY — PX: BIOPSY: SHX5522

## 2021-11-26 HISTORY — PX: HEMOSTASIS CLIP PLACEMENT: SHX6857

## 2021-11-26 LAB — GLUCOSE, CAPILLARY
Glucose-Capillary: 90 mg/dL (ref 70–99)
Glucose-Capillary: 82 mg/dL (ref 70–99)

## 2021-11-26 SURGERY — COLONOSCOPY WITH PROPOFOL
Anesthesia: Monitor Anesthesia Care

## 2021-11-26 MED ORDER — PROPOFOL 500 MG/50ML IV EMUL
INTRAVENOUS | Status: DC | PRN
  Administered 2021-11-26: 150 ug/kg/min via INTRAVENOUS

## 2021-11-26 MED ORDER — OXYCODONE HCL 5 MG PO TABS: 5.0000 mg | ORAL_TABLET | Freq: Once | ORAL | Status: DC | PRN

## 2021-11-26 MED ORDER — OXYCODONE HCL 5 MG/5ML PO SOLN: 5.0000 mg | Freq: Once | ORAL | Status: DC | PRN

## 2021-11-26 MED ORDER — PROMETHAZINE HCL 25 MG/ML IJ SOLN: 6.2500 mg | INTRAMUSCULAR | Status: DC | PRN

## 2021-11-26 MED ORDER — HYDROMORPHONE HCL 1 MG/ML IJ SOLN: 0.2500 mg | INTRAMUSCULAR | Status: DC | PRN

## 2021-11-26 MED ORDER — LIDOCAINE 2% (20 MG/ML) 5 ML SYRINGE
INTRAMUSCULAR | Status: DC | PRN
  Administered 2021-11-26: 100 mg via INTRAVENOUS

## 2021-11-26 MED ORDER — PROPOFOL 10 MG/ML IV BOLUS
INTRAVENOUS | Status: DC | PRN
  Administered 2021-11-26 (×3): 20 mg via INTRAVENOUS
  Administered 2021-11-26 (×2): 30 mg via INTRAVENOUS
  Administered 2021-11-26: 20 mg via INTRAVENOUS

## 2021-11-26 MED ORDER — SPOT INK MARKER SYRINGE KIT
PACK | SUBMUCOSAL | Status: DC | PRN
  Administered 2021-11-26: 1 mL via SUBMUCOSAL

## 2021-11-26 MED ORDER — SODIUM CHLORIDE 0.9 % IV SOLN: INTRAVENOUS | Status: DC

## 2021-11-26 MED ORDER — LACTATED RINGERS IV SOLN
INTRAVENOUS | Status: DC
Start: 2021-11-26 — End: 2021-11-26

## 2021-11-26 SURGICAL SUPPLY — 25 items

## 2021-11-26 NOTE — Anesthesia Procedure Notes (Signed)
Procedure Name: [place] ?Date/Time: 11/26/2021 12:09 PM ?Performed by: Erick Colace, CRNA ?Pre-anesthesia Checklist: Patient identified, Emergency Drugs available, Suction available and Patient being monitored ?Patient Re-evaluated:Patient Re-evaluated prior to induction ?Oxygen Delivery Method: Nasal cannula ?Preoxygenation: Pre-oxygenation with 100% oxygen ?Induction Type: IV induction ?Dental Injury: Teeth and Oropharynx as per pre-operative assessment  ? ? ? ? ?

## 2021-11-26 NOTE — Transfer of Care (Signed)
Immediate Anesthesia Transfer of Care Note ? ?Patient: Bryan Vargas ? ?Procedure(s) Performed: COLONOSCOPY WITH PROPOFOL ?ESOPHAGOGASTRODUODENOSCOPY (EGD) WITH PROPOFOL ?BIOPSY ?POLYPECTOMY ?SUBMUCOSAL LIFTING INJECTION ?HEMOSTASIS CLIP PLACEMENT ?SUBMUCOSAL TATTOO INJECTION ? ?Patient Location: PACU ? ?Anesthesia Type:MAC ? ?Level of Consciousness: awake ? ?Airway & Oxygen Therapy: Patient Spontanous Breathing ? ?Post-op Assessment: Report given to RN and Post -op Vital signs reviewed and stable ? ?Post vital signs: Reviewed and stable ? ?Last Vitals:  ?Vitals Value Taken Time  ?BP 134/79 11/26/21 1334  ?Temp    ?Pulse 64 11/26/21 1336  ?Resp 17 11/26/21 1336  ?SpO2 98 % 11/26/21 1336  ?Vitals shown include unvalidated device data. ? ?Last Pain:  ?Vitals:  ? 11/26/21 1045  ?TempSrc: Temporal  ?PainSc: 0-No pain  ?   ? ?  ? ?Complications: No notable events documented. ?

## 2021-11-26 NOTE — Op Note (Addendum)
[facility] ?Patient Name: Bryan Vargas ?Procedure Date : 11/26/2021 ?MRN: [id number] ?Attending MD: Justice Britain , MD ?Date of Birth: [birth year] ?CSN: [id number] ?Age: 71 ?Admit Type: Inpatient ?Procedure:                Colonoscopy ?Indications:              Screening for colorectal malignant neoplasm ?Providers:                Justice Britain, MD, Doristine Johns, RN, Lowella Dandy  ?                          Lennox Grumbles, Technician ?Referring MD:              ?Medicines:                Monitored Anesthesia Care ?Complications:            No immediate complications. ?Estimated Blood Loss:     Estimated blood loss was minimal. ?Procedure:                Pre-Anesthesia Assessment: ?                          - Prior to the procedure, a History and Physical  ?                          was performed, and patient medications and  ?                          allergies were reviewed. The patient's tolerance of  ?                          previous anesthesia was also reviewed. The risks  ?                          and benefits of the procedure and the sedation  ?                          options and risks were discussed with the patient.  ?                          All questions were answered, and informed consent  ?                          was obtained. Prior Anticoagulants: The patient has  ?                          taken no previous anticoagulant or antiplatelet  ?                          agents except for aspirin. ASA Grade Assessment:  ?                          III - A patient with severe systemic disease. After  ?  reviewing the risks and benefits, the patient was  ?                          deemed in satisfactory condition to undergo the  ?                          procedure. ?                          After obtaining informed consent, the colonoscope  ?                          was passed under direct vision. Throughout the  ?                          procedure, the patient's  blood pressure, pulse, and  ?                          oxygen saturations were monitored continuously. The  ?                          CF-HQ190L ([id number]) Olympus coloscope was  ?                          introduced through the anus and advanced to the 6  ?                          cm into the ileum. The colonoscopy was performed  ?                          without difficulty. The patient tolerated the  ?                          procedure. The quality of the bowel preparation was  ?                          adequate. The terminal ileum, ileocecal valve,  ?                          appendiceal orifice, and rectum were photographed. ?Scope In: 12:27:57 PM ?Scope Out: 1:25:53 PM ?Scope Withdrawal Time: 0 hours 54 minutes 30 seconds  ?Total Procedure Duration: 0 hours 57 minutes 56 seconds  ?Findings: ?     The digital rectal exam findings include hemorrhoids. Pertinent  ?     negatives include no palpable rectal lesions. ?     The terminal ileum appeared normal. ?     A 35 mm polyp was found in the cecum and it was extending into the  ?     ileocecal valve and into the terminal ileum. The polyp was granular  ?     lateral spreading. Preparations were made for attempt at mucosal  ?     resection. NBI imaging and White-light endoscopy was done to demarcate  ?     the borders of the lesion. Eleview was injected to raise the lesion from  ?     different locations  and seemed to lift it out of the ileum . Piecemeal  ?     mucosal resection using a snare was performed. Resection and retrieval  ?     were complete. To prevent bleeding after mucosal resection, three  ?     hemostatic clips were successfully placed (MR conditional). There was no  ?     bleeding at the end of the procedure. ?     A 22 mm polyp was found in the descending colon. The polyp was  ?     semi-sessile. Preparations were made for mucosal resection. NBI imaging  ?     and White-light endoscopy was done to demaracte the borders of the  ?     lesion.  Eleview was injected to raise the lesion. Piecemeal mucosal  ?     resection using a snare was performed. Resection and retrieval were  ?     complete. To prevent bleeding after mucosal resection, two hemostatic  ?     clips were successfully placed (MR conditional). There was no bleeding  ?     at the end of the procedure. Tattoo placed distal to the lesion on  ?     contralateral wall. ?     Seven sessile polyps were found in the rectum (1), recto-sigmoid colon  ?     (1), descending colon (2), transverse colon (2), ascending colon (2) and  ?     cecum (1). The polyps were 3 to 15 mm in size. These polyps were removed  ?     with a cold snare. Resection and retrieval were complete. ?     Multiple small-mouthed diverticula were found in the recto-sigmoid colon  ?     and sigmoid colon. ?     Normal mucosa was found in the entire colon otherwise. ?     Non-bleeding non-thrombosed external and internal hemorrhoids were found  ?     during retroflexion, during perianal exam and during digital exam. The  ?     hemorrhoids were Grade III (internal hemorrhoids that prolapse but  ?     require manual reduction). ?Impression:               - Hemorrhoids found on digital rectal exam. ?                          - The examined portion of the terminal ileum was  ?                          normal. ?                          - One 35 mm polyp in the cecum extending through  ?                          the ICV into the ileum was noted. Removed with  ?                          piecemeal mucosal resection. Resected and  ?                          retrieved. Clips (MR conditional) were placed. I  ?  could not close the defect in the ileum as I was  ?                          concerned it would close off the ICV. ?                          - One 22 mm polyp in the descending colon, removed  ?                          with mucosal resection. Resected and retrieved.  ?                          Clips (MR  conditional) were placed. Tattooed  ?                          distally on contralateral wall. ?                          - Seven 3 to 15 mm polyps in the rectum, at the  ?                          recto-sigmoid colon, in the descending colon, in  ?                          the transverse colon, in the ascending colon and in  ?                          the cecum, removed with a cold snare. Resected and  ?                          retrieved. ?                          - Diverticulosis in the recto-sigmoid colon and in  ?                          the sigmoid colon. ?                          - Normal mucosa in the entire examined colon  ?                          otherwise. ?                          - Non-bleeding non-thrombosed external and internal  ?                          hemorrhoids. ?Recommendation:           - The patient will be observed post-procedure,  ?                          until all discharge criteria are met. ?                          -  Discharge patient to home. ?                          - Patient has a contact number available for  ?                          emergencies. The signs and symptoms of potential  ?                          delayed complications were discussed with the  ?                          patient. Return to normal activities tomorrow.  ?                          Written discharge instructions were provided to the  ?                          patient. ?                          - High fiber diet. ?                          - Use FiberCon 1-2 tablets PO daily. ?                          - No NSAIDs for at least 2-weeks. ?                          - OK for aspirin tomorrow. ?                          - Continue present medications. ?                          - Await pathology results. ?                          - Repeat colonoscopy in 9 months for surveillance  ?                          after piecemeal polypectomy. ?                          - The findings and recommendations  were discussed  ?                          with the patient. ?                          - The findings and recommendations were discussed  ?                          with the patient's family. ?Procedure Code(s):

## 2021-11-26 NOTE — Op Note (Signed)
[facility] ?Patient Name: Bryan Vargas ?Procedure Date : 11/26/2021 ?MRN: [id number] ?Attending MD: Justice Britain , MD ?Date of Birth: 09-Jan-1951 ?CSN: [id number] ?Age: 71 ?Admit Type: Inpatient ?Procedure:                Upper GI endoscopy ?Indications:              Follow-up of duodenal stenosis, Assessment  ?                          following gastrojejunostomy ?Providers:                Justice Britain, MD, Doristine Johns, RN, Lowella Dandy  ?                          Lennox Grumbles, Technician ?Referring MD:             Blanchard Mane. Nestor Lewandowsky ?Medicines:                Monitored Anesthesia Care ?Complications:            No immediate complications. ?Estimated Blood Loss:     Estimated blood loss was minimal. ?Procedure:                Pre-Anesthesia Assessment: ?                          - Prior to the procedure, a History and Physical  ?                          was performed, and patient medications and  ?                          allergies were reviewed. The patient's tolerance of  ?                          previous anesthesia was also reviewed. The risks  ?                          and benefits of the procedure and the sedation  ?                          options and risks were discussed with the patient.  ?                          All questions were answered, and informed consent  ?                          was obtained. Prior Anticoagulants: The patient has  ?                          taken no previous anticoagulant or antiplatelet  ?                          agents except for aspirin. ASA Grade Assessment:  ?  III - A patient with severe systemic disease. After  ?                          reviewing the risks and benefits, the patient was  ?                          deemed in satisfactory condition to undergo the  ?                          procedure. ?                          After obtaining informed consent, the endoscope was  ?                          passed  under direct vision. Throughout the  ?                          procedure, the patient's blood pressure, pulse, and  ?                          oxygen saturations were monitored continuously. The  ?                          [id number]-H190 ([id number]) Olympus endoscope was introduced  ?                          through the mouth, and advanced to the second part  ?                          of duodenum. The upper GI endoscopy was  ?                          accomplished without difficulty. The patient  ?                          tolerated the procedure. ?Scope In: ?Scope Out: ?Findings: ?     No gross lesions were noted in the proximal esophagus and in the mid  ?     esophagus. ?     An island of salmon-colored mucosa was present from 43 to 45 cm. No  ?     other visible abnormalities were present. Biopsies were taken with a  ?     cold forceps for histology to rule out Barrett's. Previous esophagitis  ?     has healed. ?     The Z-line was irregular and was found 45 cm from the incisors. ?     A dilation deformity was found of the entire examined stomach. ?     Evidence of a gastrojejunostomy was found in the gastric body. ?     Normal mucosa was found in the examined jejunum from the  ?     gastrojejunostomy. ?     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     first portion of the duodenum and was non-traversed. The tissue was  ?  villous in appearance. This had previously been biopsied on multiple  ?     occasions and negative for malignancy/dysplasia. However, the  ?     significance is still present, so biopsies were taken with a cold  ?     forceps for histology to rule out dysplasia. ?Impression:               - No gross lesions in esophagus proximally.  ?                          Salmon-colored mucosal island suspicious for  ?                          short-segment Barrett's esophagus - biopsied  ?                          distally. Previous esophagitis has healed. ?                           - Z-line irregular, 45 cm from the incisors. ?                          - Dilation deformity of the entire stomach was  ?                          noted.. ?                          - A gastrojejunostomy was found in the gastric body. ?                          - Normal mucosa was found in the jejunum that was  ?                          examined. ?                          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                          sweep. This was not traversed. Biopsied to rule out  ?                          dysplasia. ?Recommendation:           - Proceed to scheduled colonoscopy. ?                          - Continue present medications. ?                          - Await pathology results. ?                          - Repeat upper endoscopy for surveillance based on  ?  pathology results. ?                          - The findings and recommendations were discussed  ?                          with the patient. ?                          - The findings and recommendations were discussed  ?                          with the patient's family. ?Procedure Code(s):        --- Professional --- ?                          [id number], Esophagogastroduodenoscopy, flexible,  ?                          transoral; with biopsy, single or multiple ?Diagnosis Code(s):        --- Professional --- ?                          K22.8, Other specified diseases of esophagus ?                          K31.89, Other diseases of stomach and duodenum ?                          Z98.0, Intestinal bypass and anastomosis status ?                          K31.5, Obstruction of duodenum ?                          Z09, Encounter for follow-up examination after  ?                          completed treatment for conditions other than  ?                          malignant neoplasm ?CPT copyright 2019 [employer]. All rights reserved. ?The codes documented in this report are preliminary and  upon coder review may  ?be revised to meet current compliance requirements. ?Justice Britain, MD ?11/26/2021 1:36:46 PM ?Number of Addenda: 0 ?

## 2021-11-26 NOTE — Anesthesia Preprocedure Evaluation (Signed)
Anesthesia Evaluation  ?Patient identified by MRN, date of birth, ID band ?Patient awake ? ? ? ?Reviewed: ?Allergy & Precautions, NPO status , Patient's Chart, lab work & pertinent test results ? ?Airway ?Mallampati: IV ? ?TM Distance: >3 FB ?Neck ROM: Full ? ? ? Dental ?no notable dental hx. ? ?  ?Pulmonary ?asthma (childhood) ,  ?  ?Pulmonary exam normal ?breath sounds clear to auscultation ? ? ? ? ? ? Cardiovascular ?+ CAD  ?Normal cardiovascular exam ?Rhythm:Regular Rate:Normal ? ?ECG: SR, rate 73 ? ?ECHO: ?1. Left ventricular ejection fraction, by estimation, is 55 to 60%. The left ventricle has normal ?function. The left ventricle has no regional wall motion abnormalities. There is mild left ?ventricular hypertrophy. Left ventricular diastolic parameters were normal. ?2. Right ventricular systolic function is normal. The right ventricular size is normal. There is ?normal pulmonary artery systolic pressure. ?3. The mitral valve is normal in structure. Trivial mitral valve regurgitation. ?4. The aortic valve is tricuspid. Aortic valve regurgitation is not visualized. Mild aortic valve ?sclerosis is present, with no evidence of aortic valve stenosis. ?  ?Neuro/Psych ?negative neurological ROS ? negative psych ROS  ? GI/Hepatic ?PUD, GERD  Medicated and Controlled,(+) Hepatitis -  ?Endo/Other  ?diabetes, Type 2 ? Renal/GU ?Renal disease  ? ?  ?Musculoskeletal ? ?(+) Arthritis , Gout  ? Abdominal ?  ?Peds ? Hematology ?negative hematology ROS ?(+)   ?Anesthesia Other Findings ?Duodenal Stricture ? Reproductive/Obstetrics ? ?  ? ? ? ? ? ? ? ? ? ? ? ? ? ?  ?  ? ? ? ? ? ? ? ? ?Anesthesia Physical ? ?Anesthesia Plan ? ?ASA: 3 ? ?Anesthesia Plan: MAC  ? ?Post-op Pain Management:   ? ?Induction: Intravenous ? ?PONV Risk Score and Plan: 1 and Ondansetron and Treatment may vary due to age or medical condition ? ?Airway Management Planned: Nasal Cannula ? ?Additional Equipment:  ? ?Intra-op  Plan:  ? ?Post-operative Plan:  ? ?Informed Consent: I have reviewed the patients History and Physical, chart, labs and discussed the procedure including the risks, benefits and alternatives for the proposed anesthesia with the patient or authorized representative who has indicated his/her understanding and acceptance.  ? ? ? ?Dental advisory given ? ?Plan Discussed with: CRNA ? ?Anesthesia Plan Comments:   ? ? ? ? ? ? ?Anesthesia Quick Evaluation ? ?

## 2021-11-26 NOTE — H&P (Signed)
? ?GASTROENTEROLOGY PROCEDURE H&P NOTE  ? ?Primary Care Physician: ?Quentin Cornwall, MD ? ?HPI: ?Bryan Vargas is a 71 y.o. male who presents for EGD/colonoscopy for follow-up of esophagitis and duodenal stenosis/stricture now status post gastric bypass and altered bowel habits and colon cancer screening ? ?Past Medical History:  ?Diagnosis Date  ? Arthritis   ? Asthma   ? child  ? Coronary artery calcification seen on CAT scan 09/2020  ? Diabetes ([place])   ? GERD (gastroesophageal reflux disease)   ? Gout   ? Hepatitis   ? at age 61  ? History of kidney stones   ? passed  ? ?Past Surgical History:  ?Procedure Laterality Date  ? arthritis    ? BALLOON DILATION N/A 03/02/2021  ? Procedure: BALLOON DILATION;  Surgeon: Daneil Dolin, MD;  Location: [facility];  Service: Gastroenterology;  Laterality: N/A;  ? BALLOON DILATION N/A 04/03/2021  ? Procedure: BALLOON DILATION;  Surgeon: Irving Copas., MD;  Location: [facility];  Service: Gastroenterology;  Laterality: N/A;  ? BALLOON DILATION N/A 04/09/2021  ? Procedure: BALLOON DILATION;  Surgeon: Irving Copas., MD;  Location: [facility];  Service: Gastroenterology;  Laterality: N/A;  ? BIOPSY  02/27/2021  ? Procedure: BIOPSY;  Surgeon: Rogene Houston, MD;  [facility]: [facility];  Service: Endoscopy;;  ? BIOPSY  03/02/2021  ? Procedure: BIOPSY;  Surgeon: Daneil Dolin, MD;  Location: [facility];  Service: Gastroenterology;;  ? BIOPSY  03/23/2021  ? Procedure: BIOPSY;  Surgeon: Irving Copas., MD;  Location: [facility];  Service: Gastroenterology;;  ? CYSTOSCOPY W/ URETERAL STENT PLACEMENT    ? ESOPHAGEAL DILATION N/A 03/18/2021  ? Procedure: ESOPHAGEAL DILATION;  Surgeon: Rogene Houston, MD;  [facility];  Service: Endoscopy;  Laterality: N/A;  ? ESOPHAGOGASTRODUODENOSCOPY (EGD) WITH PROPOFOL N/A 02/27/2021  ? Procedure: ESOPHAGOGASTRODUODENOSCOPY (EGD) WITH PROPOFOL;  Surgeon: Rogene Houston, MD;  [facility];  Service:  Endoscopy;  Laterality: N/A;  ? ESOPHAGOGASTRODUODENOSCOPY (EGD) WITH PROPOFOL N/A 03/02/2021  ? Procedure: ESOPHAGOGASTRODUODENOSCOPY (EGD) WITH PROPOFOL;  Surgeon: Daneil Dolin, MD;  Location: [facility];  Service: Gastroenterology;  Laterality: N/A;  to be done in OR per anesthesia  ? ESOPHAGOGASTRODUODENOSCOPY (EGD) WITH PROPOFOL N/A 03/18/2021  ? Procedure: ESOPHAGOGASTRODUODENOSCOPY (EGD) WITH PROPOFOL;  Surgeon: Rogene Houston, MD;  [facility];  Service: Endoscopy;  Laterality: N/A;  1:05  ? ESOPHAGOGASTRODUODENOSCOPY (EGD) WITH PROPOFOL N/A 03/23/2021  ? Procedure: ESOPHAGOGASTRODUODENOSCOPY (EGD) WITH PROPOFOL;  Surgeon: Rush Landmark Telford Nab., MD;  Location: [facility];  Service: Gastroenterology;  Laterality: N/A;  ? ESOPHAGOGASTRODUODENOSCOPY (EGD) WITH PROPOFOL N/A 04/03/2021  ? Procedure: ESOPHAGOGASTRODUODENOSCOPY (EGD) WITH PROPOFOL;  Surgeon: Rush Landmark Telford Nab., MD;  Location: [facility];  Service: Gastroenterology;  Laterality: N/A;  ? ESOPHAGOGASTRODUODENOSCOPY (EGD) WITH PROPOFOL N/A 04/09/2021  ? Procedure: ESOPHAGOGASTRODUODENOSCOPY (EGD) WITH PROPOFOL;  Surgeon: Rush Landmark Telford Nab., MD;  Location: [facility];  Service: Gastroenterology;  Laterality: N/A;  ? EXTRACORPOREAL SHOCK WAVE LITHOTRIPSY    ? EXTRACORPOREAL SHOCK WAVE LITHOTRIPSY    ? FOREIGN BODY REMOVAL  03/23/2021  ? Procedure: FOREIGN BODY REMOVAL;  Surgeon: Rush Landmark Telford Nab., MD;  Location: [facility];  Service: Gastroenterology;;  ? FOREIGN BODY REMOVAL  04/09/2021  ? Procedure: FOREIGN BODY REMOVAL;  Surgeon: Rush Landmark Telford Nab., MD;  Location: [facility];  Service: Gastroenterology;;  ? GASTROJEJUNOSTOMY N/A 04/20/2021  ? Procedure: OPEN GASTROJEJUNOSTOMY Bypass with feeding jejunostomy tube placement;  Surgeon: Dwan Bolt, MD;  Location:  [facility];  Service: General;  Laterality: N/A;  ? IMPACTION REMOVAL  04/03/2021  ? Procedure: IMPACTION REMOVAL;  Surgeon: Irving Copas., MD;   Location: [facility];  Service: Gastroenterology;;  ? PROSTATE BIOPSY    ? TONSILLECTOMY    ? ?Current Facility-Administered Medications  ?Medication Dose Route Frequency Provider Last Rate Last Admin  ? 0.9 %  sodium chloride infusion   Intravenous Continuous Mansouraty, Telford Nab., MD      ? lactated ringers infusion   Intravenous Continuous Mansouraty, Telford Nab., MD 20 mL/hr at 11/26/21 1052 New Bag at 11/26/21 1052  ? ? ?Current Facility-Administered Medications:  ?  0.9 %  sodium chloride infusion, , Intravenous, Continuous, Mansouraty, Telford Nab., MD ?  lactated ringers infusion, , Intravenous, Continuous, Mansouraty, Telford Nab., MD, Last Rate: 20 mL/hr at 11/26/21 1052, New Bag at 11/26/21 1052 ?No Known Allergies ?Family History  ?Problem Relation Age of Onset  ? Cancer Brother   ?     patient can't remember what kind  ? Colon cancer Neg Hx   ? Celiac disease Neg Hx   ? Inflammatory bowel disease Neg Hx   ? Esophageal cancer Neg Hx   ? Liver disease Neg Hx   ? Pancreatic cancer Neg Hx   ? Rectal cancer Neg Hx   ? Stomach cancer Neg Hx   ? ?Social History  ? ?Socioeconomic History  ? Marital status: Single  ?  Spouse name: Not on file  ? Number of children: 2  ? Years of education: Not on file  ? Highest education level: Not on file  ?Occupational History  ? Occupation: retired  ?Tobacco Use  ? Smoking status: Never  ? Smokeless tobacco: Never  ?Vaping Use  ? Vaping Use: Never used  ?Substance and Sexual Activity  ? Alcohol use: Not Currently  ?  Comment: ocassional - beer  ? Drug use: Never  ? Sexual activity: Not on file  ?Other Topics Concern  ? Not on file  ?Social History Narrative  ? Not on file  ? ?Social Determinants of Health  ? ?Financial Resource Strain: Not on file  ?Food Insecurity: Not on file  ?Transportation Needs: Not on file  ?Physical Activity: Not on file  ?Stress: Not on file  ?Social Connections: Not on file  ?Intimate Partner Violence: Not on file  ? ? ?Physical Exam: ?Today's  Vitals  ? 11/26/21 1045  ?BP: (!) 156/77  ?Pulse: 63  ?Resp: 15  ?Temp: (!) 97.5 ?F (36.4 ?C)  ?TempSrc: Temporal  ?SpO2: 99%  ?Weight: 90.7 kg  ?Height: '6\' 3"'$  (1.905 m)  ?PainSc: 0-No pain  ? ?Body mass index is 25 kg/m?. ?GEN: NAD ?EYE: Sclerae anicteric ?ENT: MMM ?CV: Non-tachycardic ?GI: Soft, NT/ND ?NEURO:  Alert & Oriented x 3 ? ?Lab Results: ?No results for input(s): WBC, HGB, HCT, PLT in the last 72 hours. ?BMET ?No results for input(s): NA, K, CL, CO2, GLUCOSE, BUN, CREATININE, CALCIUM in the last 72 hours. ?LFT ?No results for input(s): PROT, ALBUMIN, AST, ALT, ALKPHOS, BILITOT, BILIDIR, IBILI in the last 72 hours. ?PT/INR ?No results for input(s): LABPROT, INR in the last 72 hours. ? ? ?Impression / Plan: ?This is a 71 y.o.male who presents for EGD/colonoscopy for follow-up of esophagitis and duodenal stenosis/stricture now status post gastric bypass and altered bowel habits and colon cancer screening ? ?The risks and benefits of endoscopic evaluation/treatment were discussed with the patient and/or family; these include but are not limited to the risk  of perforation, infection, bleeding, missed lesions, lack of diagnosis, severe illness requiring hospitalization, as well as anesthesia and sedation related illnesses.  The patient's history has been reviewed, patient examined, no change in status, and deemed stable for procedure.  The patient and/or family is agreeable to proceed.  ? ? ?Justice Britain, MD ?[facility] ?Advanced Endoscopy ?Office # [phone] ? ?

## 2021-11-27 ENCOUNTER — Encounter (HOSPITAL_COMMUNITY): Payer: Self-pay | Admitting: Gastroenterology

## 2021-11-27 LAB — SURGICAL PATHOLOGY

## 2021-11-27 NOTE — Anesthesia Postprocedure Evaluation (Signed)
Anesthesia Post Note ? ?Patient: Bryan Vargas ? ?Procedure(s) Performed: COLONOSCOPY WITH PROPOFOL ?ESOPHAGOGASTRODUODENOSCOPY (EGD) WITH PROPOFOL ?BIOPSY ?POLYPECTOMY ?SUBMUCOSAL LIFTING INJECTION ?HEMOSTASIS CLIP PLACEMENT ?SUBMUCOSAL TATTOO INJECTION ? ?  ? ?Patient location during evaluation: PACU ?Anesthesia Type: MAC ?Level of consciousness: awake and alert ?Pain management: pain level controlled ?Vital Signs Assessment: post-procedure vital signs reviewed and stable ?Respiratory status: spontaneous breathing, nonlabored ventilation and respiratory function stable ?Cardiovascular status: blood pressure returned to baseline and stable ?Postop Assessment: no apparent nausea or vomiting ?Anesthetic complications: no ? ? ?No notable events documented. ? ?Last Vitals:  ?Vitals:  ? 11/26/21 1350 11/26/21 1405  ?BP: 131/85 137/82  ?Pulse: 76 63  ?Resp: 20 16  ?Temp:  36.7 ?C  ?SpO2: 100% 99%  ?  ?Last Pain:  ?Vitals:  ? 11/26/21 1405  ?TempSrc:   ?PainSc: 0-No pain  ? ?Pain Goal:   ? ?  ?  ?  ?  ?  ?  ?  ? ?Lynda Rainwater ? ? ? ? ?

## 2021-11-28 ENCOUNTER — Encounter: Payer: Self-pay | Admitting: Gastroenterology

## 2022-01-21 ENCOUNTER — Ambulatory Visit: Payer: Medicare Other

## 2022-01-21 ENCOUNTER — Other Ambulatory Visit: Payer: Medicare Other

## 2022-01-21 DIAGNOSIS — R972 Elevated prostate specific antigen [PSA]: Secondary | ICD-10-CM

## 2022-01-21 DIAGNOSIS — N138 Other obstructive and reflux uropathy: Secondary | ICD-10-CM

## 2022-01-22 LAB — PSA, TOTAL AND FREE
PSA, Free Pct: 24.1 %
PSA, Free: 0.89 ng/mL
Prostate Specific Ag, Serum: 3.7 ng/mL (ref 0.0–4.0)

## 2022-01-28 ENCOUNTER — Ambulatory Visit: Payer: Medicare Other | Admitting: Urology

## 2022-01-28 ENCOUNTER — Encounter: Payer: Self-pay | Admitting: Urology

## 2022-01-28 DIAGNOSIS — N401 Enlarged prostate with lower urinary tract symptoms: Secondary | ICD-10-CM | POA: Diagnosis not present

## 2022-01-28 DIAGNOSIS — R972 Elevated prostate specific antigen [PSA]: Secondary | ICD-10-CM | POA: Diagnosis not present

## 2022-01-28 DIAGNOSIS — N3001 Acute cystitis with hematuria: Secondary | ICD-10-CM

## 2022-01-28 DIAGNOSIS — N138 Other obstructive and reflux uropathy: Secondary | ICD-10-CM

## 2022-01-28 DIAGNOSIS — R3989 Other symptoms and signs involving the genitourinary system: Secondary | ICD-10-CM | POA: Diagnosis not present

## 2022-01-28 DIAGNOSIS — R1032 Left lower quadrant pain: Secondary | ICD-10-CM

## 2022-01-28 DIAGNOSIS — Z87442 Personal history of urinary calculi: Secondary | ICD-10-CM

## 2022-01-28 LAB — URINALYSIS, ROUTINE W REFLEX MICROSCOPIC
Bilirubin, UA: NEGATIVE
Glucose, UA: NEGATIVE
Ketones, UA: NEGATIVE
Nitrite, UA: NEGATIVE
Specific Gravity, UA: 1.015 (ref 1.005–1.030)
Urobilinogen, Ur: 1 mg/dL (ref 0.2–1.0)
pH, UA: 5.5 (ref 5.0–7.5)

## 2022-01-28 LAB — MICROSCOPIC EXAMINATION: Renal Epithel, UA: NONE SEEN /hpf

## 2022-01-28 NOTE — Progress Notes (Signed)
Subjective:  1. BPH with urinary obstruction   2. Elevated PSA   3. Pneumaturia   4. Acute cystitis with hematuria   5. History of nephrolithiasis   6. LLQ pain      01/28/22: Bryan Vargas returns today in f/u for his history of BPH with BOO and prior retention that is being treated with finasteride and tamsulosin.   His PSA prior to this visit has declined further with the finasteride to 3.7 from 5.4 at last check and 9.9 prior to treatment.  His prostate was 367m.   In May he had some pneumoturia x 3.  He recently had a colonoscopy on 11/26/21 and had a cecal polyp removed. He had some gurggling in the LLQ about  2 weeks ago but the are passed about 2-3 weeks later.  He passed some small stones last month as well.  He is voiding ok.  HE has nocturia q2hrs.   He thinks he has another stone and started hurting about 2 days ago.   The pain isn't too bad.  He has had no nausea or fever.  The urine smells and it looks cloudy.  He has some dark urine during the day.  It is worse with sweet tea.   07/23/21: Bryan Vargas today in f/u for his history of BPH with BOO and retention.  He remains on tamsulosin and finasteride.    He remains on tamsulosin and finasteride and his IPSS is 3 today.  He has nocturia x 1.  He has a 3555mprostate.  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  He didn't have retention after that procedure.   His PSA has fallen further on finasteride to 5.4 with a 25% f/t ratio.  I was 9.9 in 2/22.   He thinks he passed a stone since his last visit and his CT in 7/22 did show some bladder stones.   He has severe right renal atrophy and multiple left renal cysts.  No renal stones were seen.   03/09/21: Bryan Vargas and underwent EGD 03/02/21. He had a foley placed for retention and then removed. He had dysuria and started nitrofurantoin 03/06/21 but urine cx was negative x 2. Ultimately the foley was replaced 03/07/21 with >  1 L in bladder. He presents late this afternoon for void trial.  He was constipated but now having BMs. On 5ari monotherapy.    01/15/21: Bryan Vargas today in f/u.   He has BPH with a 35049mrostate and small renal stones.  He remains on tamsulosin and was given finasteride at his last visit. He has had no side effects with the med.  He has had no hematuria.  He has a reduced stream in the AM with some intermittency.   His PSA fell from 9.9 with a 36.5% f/t ratio to 6.9 with a 34.8% f/t ratio over the last 3 months on the finasteride.  His IPSS is 4.   His UA has >30 WBC and 3-10 RBC with few bacteria.  This is stable.     10/16/20: Bryan Vargas today in f/u with CT results.  He has right renal atrophy with punctate stones.  He has left renal cysts.  His prostate is very large with intravesical protrusion and tiny bladder stones but no obvious mass. His prostate measures about 350m33m He is voiding well with a reduced stream.  His IPSS is 5.  His  urine culture was negative on 09/04/20.  His UA has >30 WBC and many bacteria but no only 0-2 RBC's.  He remains on tamsulosin but hasn't notice improvement with that.   Bryan Vargas is a 71 yo make with a history of stones.  He last passed a stone about 3 months ago.  He currently has no symptoms but his UA today has >30 WBC's, 11-30 RBC's and mod bacteria.  He has had some gross hematuria intermittently over the last 2 years.  He was treated with antibiotics when he last bled 3 months ago and the bleeding clear.  He stays hydrated.  His IPSS is 5 with nocturia x .   He has had ESWL x 2 and ureteroscopy x 1.  He had an 8x81m stone and was stented in 2015.   His prior care has been in [facility]  He has no dysuria or flank pain at this time.    He is currently on tamsulosin and has been on it for a year.   He has had prior prostate biopsies with the last I find in 2008 for a PSA of 4.69.  His prostate was 65.446mand the biopsy was negative and has had cystoscopy.      ROS:  ROS:  A complete review of systems was performed.  All systems are negative except for pertinent findings as noted.   Review of Systems  Constitutional:  Positive for weight loss (30 lb since his stomach procedure.).    No Known Allergies  Outpatient Encounter Medications as of 01/28/2022  Medication Sig   albuterol (VENTOLIN HFA) 108 (90 Base) MCG/ACT inhaler Inhale 1-2 puffs into the lungs every 6 (six) hours as needed for shortness of breath.   aspirin EC 81 MG tablet Take 81 mg by mouth daily. Swallow whole.   azelastine (ASTELIN) 0.1 % nasal spray Place 1 spray into both nostrils 2 (two) times daily as needed for rhinitis.   finasteride (PROSCAR) 5 MG tablet Take 1 tablet (5 mg total) by mouth daily.   latanoprost (XALATAN) 0.005 % ophthalmic solution Place 1 drop into both eyes at bedtime.   Multiple Vitamins-Minerals (CENTRUM ADULTS PO) Take 1 tablet by mouth daily.   pantoprazole (PROTONIX) 40 MG tablet TAKE 1 TABLET BY MOUTH TWICE A DAY   tamsulosin (FLOMAX) 0.4 MG CAPS capsule Take 1 capsule (0.4 mg total) by mouth daily after supper.   No facility-administered encounter medications on file as of 01/28/2022.    Past Medical History:  Diagnosis Date   Arthritis    Asthma    child   Coronary artery calcification seen on CAT scan 09/2020   Diabetes (HCC)    GERD (gastroesophageal reflux disease)    Gout    Hepatitis    at age 71 History of kidney stones    passed    Past Surgical History:  Procedure Laterality Date   arthritis     BALLOON DILATION N/A 03/02/2021   Procedure: BALLOON DILATION;  Surgeon: RoDaneil DolinMD;  [facility];  Service: Gastroenterology;  Laterality: N/A;   BALLOON DILATION N/A 04/03/2021   Procedure: BALLOON DILATION;  Surgeon: MaRush LandmarkaTelford Nab MD;  [facility];  Service: Gastroenterology;  Laterality: N/A;   BALLOON DILATION N/A 04/09/2021   Procedure: BALLOON DILATION;  Surgeon: MaRush LandmarkaTelford Nab  MD;  [facility] Service: Gastroenterology;  Laterality: N/A;   BIOPSY  02/27/2021   Procedure: BIOPSY;  Surgeon: ReRogene HoustonMD;  [facility];  Service: Endoscopy;;   BIOPSY  03/02/2021   Procedure: BIOPSY;  Surgeon: Daneil Dolin, MD;  Location: [facility];  Service: Gastroenterology;;   BIOPSY  03/23/2021   Procedure: BIOPSY;  Surgeon: Irving Copas., MD;  [facility];  Service: Gastroenterology;;   BIOPSY  11/26/2021   Procedure: BIOPSY;  Surgeon: Irving Copas., MD;  Location: [facility];  Service: Gastroenterology;;   COLONOSCOPY WITH PROPOFOL N/A 11/26/2021   Procedure: COLONOSCOPY WITH PROPOFOL;  Surgeon: Irving Copas., MD;  Location: [facility];  Service: Gastroenterology;  Laterality: N/A;   CYSTOSCOPY W/ URETERAL STENT PLACEMENT     ESOPHAGEAL DILATION N/A 03/18/2021   Procedure: ESOPHAGEAL DILATION;  Surgeon: Rogene Houston, MD;  [facility];  Service: Endoscopy;  Laterality: N/A;   ESOPHAGOGASTRODUODENOSCOPY (EGD) WITH PROPOFOL N/A 02/27/2021   Procedure: ESOPHAGOGASTRODUODENOSCOPY (EGD) WITH PROPOFOL;  Surgeon: Rogene Houston, MD;  [facility];  Service: Endoscopy;  Laterality: N/A;   ESOPHAGOGASTRODUODENOSCOPY (EGD) WITH PROPOFOL N/A 03/02/2021   Procedure: ESOPHAGOGASTRODUODENOSCOPY (EGD) WITH PROPOFOL;  Surgeon: Daneil Dolin, MD;  [facility];  Service: Gastroenterology;  Laterality: N/A;  to be done in OR per anesthesia   ESOPHAGOGASTRODUODENOSCOPY (EGD) WITH PROPOFOL N/A 03/18/2021   Procedure: ESOPHAGOGASTRODUODENOSCOPY (EGD) WITH PROPOFOL;  Surgeon: Rogene Houston, MD;  [facility];  Service: Endoscopy;  Laterality: N/A;  1:05   ESOPHAGOGASTRODUODENOSCOPY (EGD) WITH PROPOFOL N/A 03/23/2021   Procedure: ESOPHAGOGASTRODUODENOSCOPY (EGD) WITH PROPOFOL;  Surgeon: Rush Landmark Telford Nab., MD;  [facility];  Service: Gastroenterology;  Laterality: N/A;    ESOPHAGOGASTRODUODENOSCOPY (EGD) WITH PROPOFOL N/A 04/03/2021   Procedure: ESOPHAGOGASTRODUODENOSCOPY (EGD) WITH PROPOFOL;  Surgeon: Rush Landmark Telford Nab., MD;  [facility];  Service: Gastroenterology;  Laterality: N/A;   ESOPHAGOGASTRODUODENOSCOPY (EGD) WITH PROPOFOL N/A 04/09/2021   Procedure: ESOPHAGOGASTRODUODENOSCOPY (EGD) WITH PROPOFOL;  Surgeon: Rush Landmark Telford Nab., MD;  Location: [facility];  Service: Gastroenterology;  Laterality: N/A;   ESOPHAGOGASTRODUODENOSCOPY (EGD) WITH PROPOFOL N/A 11/26/2021   Procedure: ESOPHAGOGASTRODUODENOSCOPY (EGD) WITH PROPOFOL;  Surgeon: Rush Landmark Telford Nab., MD;  Location: [facility];  Service: Gastroenterology;  Laterality: N/A;   EXTRACORPOREAL SHOCK WAVE LITHOTRIPSY     EXTRACORPOREAL SHOCK WAVE LITHOTRIPSY     FOREIGN BODY REMOVAL  03/23/2021   Procedure: FOREIGN BODY REMOVAL;  Surgeon: Rush Landmark Telford Nab., MD;  [facility];  Service: Gastroenterology;;   FOREIGN BODY REMOVAL  04/09/2021   Procedure: FOREIGN BODY REMOVAL;  Surgeon: Irving Copas., MD;  Location: [facility];  Service: Gastroenterology;;   Johann Capers N/A 04/20/2021   Procedure: OPEN GASTROJEJUNOSTOMY Bypass with feeding jejunostomy tube placement;  Surgeon: Dwan Bolt, MD;  Location: [facility];  Service: General;  Laterality: N/A;   HEMOSTASIS CLIP PLACEMENT  11/26/2021   Procedure: HEMOSTASIS CLIP PLACEMENT;  Surgeon: Irving Copas., MD;  Location: [facility];  Service: Gastroenterology;;   IMPACTION REMOVAL  04/03/2021   Procedure: IMPACTION REMOVAL;  Surgeon: Irving Copas., MD;  [facility];  Service: Gastroenterology;;   POLYPECTOMY  11/26/2021   Procedure: POLYPECTOMY;  Surgeon: Irving Copas., MD;  Location: [facility];  Service: Gastroenterology;;   [place] INJECTION  11/26/2021   Procedure: SUBMUCOSAL LIFTING INJECTION;  Surgeon: Irving Copas., MD;   Location: [facility];  Service: Gastroenterology;;   SUBMUCOSAL TATTOO INJECTION  11/26/2021   Procedure: SUBMUCOSAL TATTOO INJECTION;  Surgeon: Irving Copas., MD;  Location: [facility];  Service: Gastroenterology;;  TONSILLECTOMY      Social History   Socioeconomic History   Marital status: Single    Spouse name: Not on file   Number of children: 2   Years of education: Not on file   Highest education level: Not on file  Occupational History   Occupation: retired  Tobacco Use   Smoking status: Never   Smokeless tobacco: Never  Vaping Use   Vaping Use: Never used  Substance and Sexual Activity   Alcohol use: Not Currently    Comment: ocassional - beer   Drug use: Never   Sexual activity: Not on file  Other Topics Concern   Not on file  Social History Narrative   Not on file   Social Determinants of Health   Financial Resource Strain: Not on file  Food Insecurity: Not on file  Transportation Needs: Not on file  Physical Activity: Not on file  Stress: Not on file  Social Connections: Not on file  Intimate Partner Violence: Not on file    Family History  Problem Relation Age of Onset   Cancer Brother        patient can't remember what kind   Colon cancer Neg Hx    Celiac disease Neg Hx    Inflammatory bowel disease Neg Hx    Esophageal cancer Neg Hx    Liver disease Neg Hx    Pancreatic cancer Neg Hx    Rectal cancer Neg Hx    Stomach cancer Neg Hx        Objective: There were no vitals filed for this visit.   Physical Exam  Lab Results:  PSA No results found for: "PSA" No results found for: "TESTOSTERONE" Results for orders placed or performed in visit on 01/28/22 (from the past 24 hour(s))  Urinalysis, Routine w reflex microscopic     Status: Abnormal   Collection Time: 01/28/22  4:47 PM  Result Value Ref Range   Specific Gravity, UA 1.015 1.005 - 1.030   pH, UA 5.5 5.0 - 7.5   Color, UA Amber (A) Yellow   Appearance Ur Cloudy  (A) Clear   Leukocytes,UA 1+ (A) Negative   Protein,UA 2+ (A) Negative/Trace   Glucose, UA Negative Negative   Ketones, UA Negative Negative   RBC, UA 2+ (A) Negative   Bilirubin, UA Negative Negative   Urobilinogen, Ur 1.0 0.2 - 1.0 mg/dL   Nitrite, UA Negative Negative   Microscopic Examination See below:    Narrative   Performed at:  [facility] [address] [place], [place]  [id number] Lab Director: Mina Marble MT, Phone:  [phone]  Microscopic Examination     Status: Abnormal   Collection Time: 01/28/22  4:47 PM   Urine  Result Value Ref Range   WBC, UA 11-30 (A) 0 - 5 /hpf   RBC 11-30 (A) 0 - 2 /hpf   Epithelial Cells (non renal) 0-10 0 - 10 /hpf   Renal Epithel, UA None seen None seen /hpf   Bacteria, UA Many (A) None seen/Few   Narrative   Performed at:  [facility] [address], [place], [place]  [id number] Lab Director: [place], Phone:  [phone]      Studies/Results: No results found.   US RENAL  Narrative CLINICAL DATA:  Acute kidney injury, history diabetes mellitus, coronary artery disease  EXAM: RENAL / URINARY TRACT ULTRASOUND COMPLETE  COMPARISON:  CT abdomen pelvis without contrast 02/25/2021  FINDINGS: Right Kidney:  Renal measurements: 5.8 x 3.6 x 2.1 cm = volume: 23 mL. Small and atrophic with thinned echogenic cortex. No mass or hydronephrosis.  Left Kidney:  Renal measurements: 17.6 x 7.4 x 7.5 cm = volume: 509 mL. Normal cortical thickness. Increased cortical echogenicity. Exophytic cyst at lower kidney 6.2 x 5.6 x 6.1 cm. Additional simple cyst at lower kidney 4.2 x 3.4 x 3.4 cm. Multiple additional smaller cysts are identified. Total of 11 cysts are noted. No hydronephrosis or shadowing calcification.  Bladder:  Mild bladder wall thickening.  Other:  Significantly enlarged prostate gland extending into the bladder base, prostate gland measuring 6.6 x 8.9 x 10.5 cm a  calculated volume of 323 mL.  IMPRESSION: Atrophic RIGHT kidney.  Medical renal disease lytic changes LEFT kidney with multiple cysts up to 6.2 cm diameter.  Markedly enlarged prostate gland with associated bladder wall thickening which may reflect chronic outlet obstruction.   Electronically Signed By: Lavonia Dana M.D. On: 02/26/2021 17:16  No results found for this or any previous visit.  Results for orders placed during the hospital encounter of 09/30/20  CT HEMATURIA WORKUP  Narrative CLINICAL DATA:  Gross hematuria, history of renal stones.  EXAM: CT ABDOMEN AND PELVIS WITHOUT AND WITH CONTRAST  TECHNIQUE: Multidetector CT imaging of the abdomen and pelvis was performed following the standard protocol before and following the bolus administration of intravenous contrast.  CONTRAST:  144m OMNIPAQUE IOHEXOL 300 MG/ML  SOLN  COMPARISON:  None.  FINDINGS: Lower chest: Image quality is degraded by respiratory motion. Lung bases are grossly clear. Coronary artery calcification. Heart size normal. No pericardial or pleural effusion. Distal esophagus is grossly unremarkable.  Hepatobiliary: Liver is decreased in attenuation diffusely and is enlarged, measuring 20.0 cm. Liver and gallbladder are otherwise unremarkable. No biliary ductal dilatation.  Pancreas: 10 x 14 mm low-attenuation lesion in the tail the pancreas (7/30). Otherwise unremarkable.  Spleen: Negative.  Adrenals/Urinary Tract: Adrenal glands are unremarkable. Right kidney is severely atrophic with dilatation of the right ureter. There may be punctate stones in the right kidney. Multiple low-attenuation lesions in the left kidney measure up to 6.2 cm. Those measuring greater than 1.5 cm in size are consistent with cysts. Others are too small to definitively characterize but statistically, cysts are likely. Subcentimeter hyperdense lesion in the anterior interpolar left kidney (2/40), too small  to characterize. No left renal or ureteric stones. Left ureter is decompressed. Poor excretion of contrast in the right kidney, limiting evaluation. Distal left ureter is poorly opacified, also limiting evaluation. Otherwise, no filling defects in the opacified portions of the left intrarenal collecting system and left ureter. There are peripheral calcifications in the posterolateral aspects of bladder bilaterally. Bladder is indented by a markedly enlarged prostate.  Stomach/Bowel: Stomach, small bowel, appendix and colon are unremarkable.  Vascular/Lymphatic: Atherosclerotic calcification of the aorta. There are several perirectal lymph nodes measuring up to 6 mm (7/81).  Reproductive: Prostate is markedly enlarged and indents the bladder.  Other: No free fluid. Mesenteries and peritoneum are otherwise unremarkable.  Musculoskeletal: There are scattered sclerotic lesions in the spine and pelvis, nonspecific. Degenerative changes in the spine.  IMPRESSION: 1. Markedly enlarged prostate indents the bladder. Peripheral bladder calcifications. Punctate stones in a severely atrophic right kidney. Right hydronephrosis without definitive cause identified. Distal left ureter is poorly opacified, limiting evaluation. No additional findings to explain the patient's hematuria. 2. Numerous subcentimeter perirectal lymph nodes. Consider correlation with colonoscopy, as clinically indicated. 3. Scattered sclerotic foci in  the spine and pelvis are nonspecific. Difficult to exclude metastatic disease. 4. Steatotic enlarged liver. 5. 10 x 14 mm low-attenuation pancreatic tail lesion may represent a pseudocyst if there is a history of pancreatitis. Cystic pancreatic neoplasm cannot be excluded. Follow-up CT abdomen without and with contrast in 2 years could be performed in further evaluation, as clinically indicated. This recommendation follows ACR consensus guidelines: Management of  Incidental Pancreatic Cysts: A White Paper of the ACR Incidental Findings Committee. J Am Coll Radiol 8676;72:094-709. 6. Aortic atherosclerosis (ICD10-I70.0). Coronary artery calcification.   Electronically Signed By: Lorin Picket M.D. On: 10/01/2020 10:31  No results found for this or any previous visit.    Assessment & Plan: Urinary tract infection with recent pneumaturia which was transient and could have been from bacterial fermentation but a fistula needs to be ruled out.  I will get a culture and set him up for a CT stone study to further evaluate.   I will send antibiotic based on the culture.  History of stones and LLQ pain with hematuria.   CT will assess this condition.    No orders of the defined types were placed in this encounter.    Orders Placed This Encounter  Procedures   Urine Culture   Microscopic Examination   CT RENAL STONE STUDY    Standing Status:   Future    Standing Expiration Date:   07/30/2022    Order Specific Question:   Preferred imaging location?    Answer:   [facility]    Order Specific Question:   Radiology Contrast Protocol - do NOT remove file path    Answer:   [email or web address]   Urinalysis, Routine w reflex microscopic      Return in about 4 weeks (around 02/25/2022) for with CT results. .   CC: Quentin Cornwall, MD      Irine Seal 01/29/2022

## 2022-01-31 LAB — URINE CULTURE

## 2022-02-02 ENCOUNTER — Telehealth: Payer: Self-pay

## 2022-02-02 MED ORDER — CEPHALEXIN 500 MG PO CAPS: 500.0000 mg | ORAL_CAPSULE | Freq: Three times a day (TID) | ORAL | 0 refills | Status: AC

## 2022-02-02 MED ORDER — CEPHALEXIN 500 MG PO CAPS: 500.0000 mg | ORAL_CAPSULE | Freq: Every day | ORAL | 3 refills | Status: DC

## 2022-02-02 NOTE — Telephone Encounter (Signed)
Rx sent. Patient called and made aware. 

## 2022-02-02 NOTE — Telephone Encounter (Signed)
-----   Message from Irine Seal, MD sent at 02/01/2022  4:35 PM EDT ----- Keflex '500mg'$  po tid #21 and then after completion start keflex '500mg'$  po qhs #30 with 3 refills  to prevent recurrent UTI.     ----- Message ----- From: Iris Pert, LPN Sent: 03/23/6552  10:09 AM EDT To: Irine Seal, MD  No tx started

## 2022-02-14 ENCOUNTER — Other Ambulatory Visit: Payer: Self-pay

## 2022-02-14 ENCOUNTER — Emergency Department (HOSPITAL_COMMUNITY): Payer: Medicare Other

## 2022-02-14 ENCOUNTER — Emergency Department (HOSPITAL_COMMUNITY)
Admission: EM | Admit: 2022-02-14 | Discharge: 2022-02-15 | Disposition: A | Discharge status: Other Acute Inpatient Hospital | Payer: Medicare Other | Attending: Emergency Medicine | Admitting: Emergency Medicine

## 2022-02-14 ENCOUNTER — Encounter (HOSPITAL_COMMUNITY): Payer: Self-pay | Admitting: Emergency Medicine

## 2022-02-14 DIAGNOSIS — N201 Calculus of ureter: Secondary | ICD-10-CM | POA: Diagnosis not present

## 2022-02-14 DIAGNOSIS — R17 Unspecified jaundice: Secondary | ICD-10-CM | POA: Diagnosis present

## 2022-02-14 DIAGNOSIS — R8289 Other abnormal findings on cytological and histological examination of urine: Secondary | ICD-10-CM | POA: Insufficient documentation

## 2022-02-14 DIAGNOSIS — N3080 Other cystitis without hematuria: Secondary | ICD-10-CM | POA: Diagnosis present

## 2022-02-14 DIAGNOSIS — D649 Anemia, unspecified: Secondary | ICD-10-CM | POA: Insufficient documentation

## 2022-02-14 DIAGNOSIS — E119 Type 2 diabetes mellitus without complications: Secondary | ICD-10-CM

## 2022-02-14 DIAGNOSIS — Z7982 Long term (current) use of aspirin: Secondary | ICD-10-CM | POA: Diagnosis not present

## 2022-02-14 DIAGNOSIS — D72829 Elevated white blood cell count, unspecified: Secondary | ICD-10-CM | POA: Insufficient documentation

## 2022-02-14 DIAGNOSIS — K831 Obstruction of bile duct: Secondary | ICD-10-CM | POA: Diagnosis not present

## 2022-02-14 DIAGNOSIS — R161 Splenomegaly, not elsewhere classified: Secondary | ICD-10-CM | POA: Insufficient documentation

## 2022-02-14 DIAGNOSIS — N1330 Unspecified hydronephrosis: Secondary | ICD-10-CM

## 2022-02-14 DIAGNOSIS — R160 Hepatomegaly, not elsewhere classified: Secondary | ICD-10-CM | POA: Diagnosis not present

## 2022-02-14 HISTORY — DX: Unspecified hydronephrosis: N13.30

## 2022-02-14 LAB — COMPREHENSIVE METABOLIC PANEL
ALT: 229 U/L — ABNORMAL HIGH (ref 0–44)
AST: 197 U/L — ABNORMAL HIGH (ref 15–41)
Albumin: 2.6 g/dL — ABNORMAL LOW (ref 3.5–5.0)
Alkaline Phosphatase: 976 U/L — ABNORMAL HIGH (ref 38–126)
Anion gap: 8 (ref 5–15)
BUN: 22 mg/dL (ref 8–23)
CO2: 22 mmol/L (ref 22–32)
Calcium: 9.4 mg/dL (ref 8.9–10.3)
Chloride: 107 mmol/L (ref 98–111)
Creatinine, Ser: 0.99 mg/dL (ref 0.61–1.24)
GFR, Estimated: >60 mL/min (ref >60)
Glucose, Bld: 146 mg/dL — ABNORMAL HIGH (ref 70–99)
Potassium: 3.9 mmol/L (ref 3.5–5.1)
Sodium: 137 mmol/L (ref 135–145)
Total Bilirubin: 15.7 mg/dL — ABNORMAL HIGH (ref 0.3–1.2)
Total Protein: 7.9 g/dL (ref 6.5–8.1)

## 2022-02-14 LAB — CBC WITH DIFFERENTIAL/PLATELET
Abs Immature Granulocytes: 0.55 10*3/uL — ABNORMAL HIGH (ref 0.00–0.07)
Basophils Absolute: 0.1 10*3/uL (ref 0.0–0.1)
Basophils Relative: 1 %
Eosinophils Absolute: 0.1 10*3/uL (ref 0.0–0.5)
Eosinophils Relative: 1 %
HCT: 30.4 % — ABNORMAL LOW (ref 39.0–52.0)
Hemoglobin: 10.8 g/dL — ABNORMAL LOW (ref 13.0–17.0)
Immature Granulocytes: 4 %
Lymphocytes Relative: 8 %
Lymphs Abs: 1.1 10*3/uL (ref 0.7–4.0)
MCH: 31.5 pg (ref 26.0–34.0)
MCHC: 35.5 g/dL (ref 30.0–36.0)
MCV: 88.6 fL (ref 80.0–100.0)
Monocytes Absolute: 1.1 10*3/uL — ABNORMAL HIGH (ref 0.1–1.0)
Monocytes Relative: 8 %
Neutro Abs: 11 10*3/uL — ABNORMAL HIGH (ref 1.7–7.7)
Neutrophils Relative %: 78 %
Platelets: 439 10*3/uL — ABNORMAL HIGH (ref 150–400)
RBC: 3.43 MIL/uL — ABNORMAL LOW (ref 4.22–5.81)
RDW: 19 % — ABNORMAL HIGH (ref 11.5–15.5)
WBC: 14.1 10*3/uL — ABNORMAL HIGH (ref 4.0–10.5)
nRBC: 0 % (ref 0.0–0.2)

## 2022-02-14 LAB — URINALYSIS, ROUTINE W REFLEX MICROSCOPIC
Glucose, UA: NEGATIVE mg/dL
Ketones, ur: NEGATIVE mg/dL
Nitrite: POSITIVE — AB
Protein, ur: 100 mg/dL — AB
Specific Gravity, Urine: 1.017 (ref 1.005–1.030)
WBC, UA: >50 WBC/hpf — ABNORMAL HIGH (ref 0–5)
pH: 5 (ref 5.0–8.0)

## 2022-02-14 LAB — LIPASE, BLOOD: Lipase: 139 U/L — ABNORMAL HIGH (ref 11–51)

## 2022-02-14 LAB — PROTIME-INR
INR: 1.2 (ref 0.8–1.2)
Prothrombin Time: 14.9 seconds (ref 11.4–15.2)

## 2022-02-14 LAB — AMMONIA: Ammonia: 14 umol/L (ref 9–35)

## 2022-02-14 LAB — FIBRINOGEN: Fibrinogen: 752 mg/dL — ABNORMAL HIGH (ref 210–475)

## 2022-02-14 LAB — APTT: aPTT: 38 seconds — ABNORMAL HIGH (ref 24–36)

## 2022-02-14 MED ORDER — IOHEXOL 300 MG/ML  SOLN
100.0000 mL | Freq: Once | INTRAMUSCULAR | Status: AC | PRN
  Administered 2022-02-14: 100 mL via INTRAVENOUS

## 2022-02-14 MED ORDER — PANTOPRAZOLE SODIUM 40 MG IV SOLR
40.0000 mg | Freq: Every day | INTRAVENOUS | Status: DC
  Administered 2022-02-15: 40 mg via INTRAVENOUS
  Filled 2022-02-14: qty 10

## 2022-02-14 MED ORDER — FINASTERIDE 5 MG PO TABS
5.0000 mg | ORAL_TABLET | Freq: Every day | ORAL | Status: DC
  Administered 2022-02-15: 5 mg via ORAL
  Filled 2022-02-14: qty 1

## 2022-02-14 MED ORDER — PIPERACILLIN-TAZOBACTAM 3.375 G IVPB 30 MIN
3.3750 g | Freq: Once | INTRAVENOUS | Status: AC
  Administered 2022-02-14: 3.375 g via INTRAVENOUS
  Filled 2022-02-14: qty 50

## 2022-02-14 MED ORDER — PIPERACILLIN-TAZOBACTAM 3.375 G IVPB
3.3750 g | Freq: Three times a day (TID) | INTRAVENOUS | Status: DC
  Administered 2022-02-15 (×3): 3.375 g via INTRAVENOUS
  Filled 2022-02-14 (×3): qty 50

## 2022-02-14 MED ORDER — SODIUM CHLORIDE 0.9 % IV SOLN
1000.0000 mL | Freq: Once | INTRAVENOUS | Status: AC
  Administered 2022-02-14: 1000 mL via INTRAVENOUS

## 2022-02-14 MED ORDER — HEPARIN SODIUM (PORCINE) 5000 UNIT/ML IJ SOLN
5000.0000 [IU] | Freq: Three times a day (TID) | INTRAMUSCULAR | Status: DC
  Administered 2022-02-15 (×2): 5000 [IU] via SUBCUTANEOUS
  Filled 2022-02-14 (×2): qty 1

## 2022-02-14 MED ORDER — DIPHENHYDRAMINE HCL 50 MG/ML IJ SOLN
12.5000 mg | Freq: Once | INTRAMUSCULAR | Status: AC
  Administered 2022-02-14: 12.5 mg via INTRAVENOUS
  Filled 2022-02-14: qty 1

## 2022-02-14 MED ORDER — TAMSULOSIN HCL 0.4 MG PO CAPS
0.4000 mg | ORAL_CAPSULE | Freq: Every day | ORAL | Status: DC
  Administered 2022-02-15: 0.4 mg via ORAL
  Filled 2022-02-14: qty 1

## 2022-02-14 MED ORDER — LACTATED RINGERS IV SOLN: INTRAVENOUS | Status: AC

## 2022-02-14 MED ORDER — PIPERACILLIN-TAZOBACTAM 3.375 G IVPB 30 MIN: 3.3750 g | Freq: Four times a day (QID) | INTRAVENOUS | Status: DC

## 2022-02-14 NOTE — Consult Note (Addendum)
Initial Consultation Note   Patient: Bryan Vargas UXL:[id number] DOB: 07-24-1951 PCP: Zachery Dauer, MD DOA: 02/14/2022 DOS: the patient was seen and examined on 02/14/2022 Primary service: Eber Hong, MD  Reason for consult: Skin discoloration  Assessment/Plan: Obstructive jaundice-elevated liver enzymes AST 197, ALT 229, ALP 976, total bilirubin 15.7.  CT showing moderate intra and extrahepatic bile duct dilatation with abrupt termination at the common bile duct at the level of the head of pancreas, differentials include malignant process, stricture, stone not visible on CT. -ED provider talked to Dr. Levon Hedger who recommended transfer to higher level of care, Dr. Marina Goodell at [facility] recommended transfer to tertiary center [facility] or [facility].  Order higher level of care due to history of GI surgery and gastric bypass procedure, hence patient needs higher level of care. - EDP called patient Tertiary facilities, patient was finally accepted at [facility], but no beds available at this time -Hospitalist called to admit.  Patient is clearly outside the scope of what [facility] is able to handle, needs urgent transfer to tertiary care center.  He is at risk for ascending cholangitis. -Continue IV Zosyn - L/R 75cc/hr x 15hrs -N.p.o. -IV Protonix 40 daily  Bilateral hydronephrosis and hydroureter, emphysematous cystitis- Follows with Dr. Annabell Howells.  UA suggestive of UTI with positive nitrite, moderate leukocytes, many bacteria.  Has a leukocytosis of 14.  Rules out for sepsis at this time.  CT abdomen and pelvis with contrast-new left hydronephrosis and hydroureter to the level of the distal left ureter, where there is a 5 mm distal left ureteral calculus.  CT also showing circumferential bladder wall thickening and intramural gas and intra luminal, no new right hydronephrosis and hydroureter with gas in the right renal collecting system and right ureter.  Patient is at risk for sepsis. -Last urine culture 01/28/22 grew  pansensitive E. coli -Continue IV Zosyn -Follow-up urine cultures  BPH -Resume finasteride tamsulosin  Diabetes -Not on medication, monitor blood sugar every 8 hourly while n.p.o. - Hgba1c  TR[facility] will continue to follow the patient.  HPI: Bryan Vargas is a 71 y.o. male with past medical history of asthma, diabetes mellitus, coronary artery disease kidney stones. Patient was sent to the ED reports of skin discoloration, and he was also seen his urologist for UTI.  Hhe reports pain with urination over the past week.  Was placed on cephalexin for UTI.  He denies abdominal pain.  He has been having generalized itching .  He also has some mild pain in his left lower abdomen. No vomiting.  No loose stools.  He has lost weight, but this is related to abdominal surgeries he has had.  Review of Systems: As mentioned in the history of present illness. All other systems reviewed and are negative. Past Medical History:  Diagnosis Date   Arthritis    Asthma    child   Coronary artery calcification seen on CAT scan 09/2020   Diabetes (HCC)    GERD (gastroesophageal reflux disease)    Gout    Hepatitis    at age 22   History of kidney stones    passed   Past Surgical History:  Procedure Laterality Date   arthritis     BALLOON DILATION N/A 03/02/2021   Procedure: BALLOON DILATION;  Surgeon: Corbin Ade, MD;  Location: [facility];  Service: Gastroenterology;  Laterality: N/A;   BALLOON DILATION N/A 04/03/2021   Procedure: BALLOON DILATION;  Surgeon: Meridee Score Netty Starring., MD;  Location: [facility] ENDOSCOPY;  Service: Gastroenterology;  Laterality: N/A;   BALLOON DILATION N/A 04/09/2021   Procedure: BALLOON DILATION;  Surgeon: Meridee Score Netty Starring., MD;  Location: [facility] ENDOSCOPY;  Service: Gastroenterology;  Laterality: N/A;   BIOPSY  02/27/2021   Procedure: BIOPSY;  Surgeon: Malissa Hippo, MD;  Location: [facility] ENDO SU[facility];  Service: Endoscopy;;   BIOPSY  03/02/2021   Procedure: BIOPSY;  Surgeon: Corbin Ade, MD;  Location: [facility];  Service: Gastroenterology;;   BIOPSY  03/23/2021   Procedure: BIOPSY;  Surgeon: Lemar Lofty., MD;  Location: [facility] ENDOSCOPY;  Service: Gastroenterology;;   BIOPSY  11/26/2021   Procedure: BIOPSY;  Surgeon: Lemar Lofty., MD;  Location: [facility] ENDOSCOPY;  Service: Gastroenterology;;   COLONOSCOPY WITH PROPOFOL N/A 11/26/2021   Procedure: COLONOSCOPY WITH PROPOFOL;  Surgeon: Lemar Lofty., MD;  Location: [facility] ENDOSCOPY;  Service: Gastroenterology;  Laterality: N/A;   CYSTOSCOPY W/ URETERAL STENT PLACEMENT     ESOPHAGEAL DILATION N/A 03/18/2021   Procedure: ESOPHAGEAL DILATION;  Surgeon: Malissa Hippo, MD;  [facility];  Service: Endoscopy;  Laterality: N/A;   ESOPHAGOGASTRODUODENOSCOPY (EGD) WITH PROPOFOL N/A 02/27/2021   Procedure: ESOPHAGOGASTRODUODENOSCOPY (EGD) WITH PROPOFOL;  Surgeon: Malissa Hippo, MD;  [facility];  Service: Endoscopy;  Laterality: N/A;   ESOPHAGOGASTRODUODENOSCOPY (EGD) WITH PROPOFOL N/A 03/02/2021   Procedure: ESOPHAGOGASTRODUODENOSCOPY (EGD) WITH PROPOFOL;  Surgeon: Corbin Ade, MD;  Location: [facility];  Service: Gastroenterology;  Laterality: N/A;  to be done in OR per anesthesia   ESOPHAGOGASTRODUODENOSCOPY (EGD) WITH PROPOFOL N/A 03/18/2021   Procedure: ESOPHAGOGASTRODUODENOSCOPY (EGD) WITH PROPOFOL;  Surgeon: Malissa Hippo, MD;  [facility];  Service: Endoscopy;  Laterality: N/A;  1:05   ESOPHAGOGASTRODUODENOSCOPY (EGD) WITH PROPOFOL N/A 03/23/2021   Procedure: ESOPHAGOGASTRODUODENOSCOPY (EGD) WITH PROPOFOL;  Surgeon: Meridee Score Netty Starring., MD;  Location: [facility] ENDOSCOPY;  Service: Gastroenterology;  Laterality: N/A;   ESOPHAGOGASTRODUODENOSCOPY (EGD) WITH PROPOFOL N/A 04/03/2021   Procedure: ESOPHAGOGASTRODUODENOSCOPY (EGD) WITH PROPOFOL;  Surgeon: Meridee Score Netty Starring., MD;  Location: [facility] ENDOSCOPY;  Service: Gastroenterology;  Laterality: N/A;   ESOPHAGOGASTRODUODENOSCOPY  (EGD) WITH PROPOFOL N/A 04/09/2021   Procedure: ESOPHAGOGASTRODUODENOSCOPY (EGD) WITH PROPOFOL;  Surgeon: Meridee Score Netty Starring., MD;  Location: [facility] ENDOSCOPY;  Service: Gastroenterology;  Laterality: N/A;   ESOPHAGOGASTRODUODENOSCOPY (EGD) WITH PROPOFOL N/A 11/26/2021   Procedure: ESOPHAGOGASTRODUODENOSCOPY (EGD) WITH PROPOFOL;  Surgeon: Meridee Score Netty Starring., MD;  Location: [facility] ENDOSCOPY;  Service: Gastroenterology;  Laterality: N/A;   EXTRACORPOREAL SHOCK WAVE LITHOTRIPSY     EXTRACORPOREAL SHOCK WAVE LITHOTRIPSY     FOREIGN BODY REMOVAL  03/23/2021   Procedure: FOREIGN BODY REMOVAL;  Surgeon: Meridee Score Netty Starring., MD;  Location: [facility] ENDOSCOPY;  Service: Gastroenterology;;   FOREIGN BODY REMOVAL  04/09/2021   Procedure: FOREIGN BODY REMOVAL;  Surgeon: Lemar Lofty., MD;  Location: [facility] ENDOSCOPY;  Service: Gastroenterology;;   Yolanda Manges N/A 04/20/2021   Procedure: OPEN GASTROJEJUNOSTOMY Bypass with feeding jejunostomy tube placement;  Surgeon: Fritzi Mandes, MD;  Location: [facility] OR;  Service: General;  Laterality: N/A;   HEMOSTASIS CLIP PLACEMENT  11/26/2021   Procedure: HEMOSTASIS CLIP PLACEMENT;  Surgeon: Lemar Lofty., MD;  Location: [facility] ENDOSCOPY;  Service: Gastroenterology;;   IMPACTION REMOVAL  04/03/2021   Procedure: IMPACTION REMOVAL;  Surgeon: Lemar Lofty., MD;  Location: [facility] ENDOSCOPY;  Service: Gastroenterology;;   POLYPECTOMY  11/26/2021   Procedure: POLYPECTOMY;  Surgeon: Lemar Lofty., MD;  Location: [facility] ENDOSCOPY;  Service: Gastroenterology;;   PROSTATE BIOPSY     SUBMUCOSAL LIFTING INJECTION  11/26/2021  Procedure: SUBMUCOSAL LIFTING INJECTION;  Surgeon: Lemar Lofty., MD;  Location: [facility] ENDOSCOPY;  Service: Gastroenterology;;   SUBMUCOSAL TATTOO INJECTION  11/26/2021   Procedure: SUBMUCOSAL TATTOO INJECTION;  Surgeon: Lemar Lofty., MD;  Location: [facility] ENDOSCOPY;  Service: Gastroenterology;;   TONSILLECTOMY     Social  History:  reports that he has never smoked. He has never used smokeless tobacco. He reports that he does not currently use alcohol. He reports that he does not use drugs.  No Known Allergies  Family History  Problem Relation Age of Onset   Cancer Brother        patient can't remember what kind   Colon cancer Neg Hx    Celiac disease Neg Hx    Inflammatory bowel disease Neg Hx    Esophageal cancer Neg Hx    Liver disease Neg Hx    Pancreatic cancer Neg Hx    Rectal cancer Neg Hx    Stomach cancer Neg Hx     Prior to Admission medications   Medication Sig Start Date End Date Taking? Authorizing Provider  albuterol (VENTOLIN HFA) 108 (90 Base) MCG/ACT inhaler Inhale 1-2 puffs into the lungs every 6 (six) hours as needed for shortness of breath. 09/30/21  Yes [provider]  aspirin EC 81 MG tablet Take 81 mg by mouth daily. Swallow whole.   Yes [provider]  azelastine (ASTELIN) 0.1 % nasal spray Place 1 spray into both nostrils 2 (two) times daily as needed for rhinitis. 07/14/20  Yes [provider]  finasteride (PROSCAR) 5 MG tablet Take 1 tablet (5 mg total) by mouth daily. 07/23/21  Yes Bjorn Pippin, MD  latanoprost (XALATAN) 0.005 % ophthalmic solution Place 1 drop into both eyes at bedtime. 07/03/20  Yes [provider]  Multiple Vitamins-Minerals (CENTRUM ADULTS PO) Take 1 tablet by mouth daily.   Yes [provider]  pantoprazole (PROTONIX) 40 MG tablet TAKE 1 TABLET BY MOUTH TWICE A DAY Patient taking differently: Take 40 mg by mouth 2 (two) times daily. 09/03/21  Yes Mansouraty, Netty Starring., MD  tamsulosin (FLOMAX) 0.4 MG CAPS capsule Take 1 capsule (0.4 mg total) by mouth daily after supper. 07/23/21  Yes Bjorn Pippin, MD  cephALEXin (KEFLEX) 500 MG capsule Take 1 capsule (500 mg total) by mouth 3 (three) times daily. Patient not taking: Reported on 02/14/2022 02/02/22   Bjorn Pippin, MD  cephALEXin (KEFLEX) 500 MG capsule Take 1 capsule  (500 mg total) by mouth at bedtime. Start after completion of 7 day course to prevent recurrent UTI Patient not taking: Reported on 02/14/2022 02/02/22   Bjorn Pippin, MD    Physical Exam: Vitals:   02/14/22 1530 02/14/22 1600 02/14/22 1951 02/14/22 2030  BP: 115/67 120/67 119/68 123/71  Pulse: 70 70 81 76  Resp:  18 16 16   Temp:      TempSrc:      SpO2: 98% 98% 99% 98%  Weight:      Height:        Constitutional: NAD, calm, comfortable Eyes: PERRL, icteric conjunctiva  ENMT: Mucous membranes are moist.   Neck: normal, supple, no masses, no thyromegaly Respiratory: clear to auscultation bilaterally, no wheezing, no crackles. Normal respiratory effort. No accessory muscle use.  Cardiovascular: Regular rate and rhythm, no murmurs / rubs / gallops. No extremity edema.  Lower extremities warm. Abdomen: no tenderness, no masses palpated. No hepatosplenomegaly. Bowel sounds positive.  Musculoskeletal: no clubbing / cyanosis. No joint deformity upper and lower  extremities.  Skin: Severely jaundiced, no rashes, lesions, ulcers. No induration Neurologic: No apparent cranial nerve abnormality, moving extremities spontaneously.  Psychiatric: Normal judgment and insight. Alert and oriented x 3. Normal mood.   DVT PPX- Heparin for now  Family Communication: None at bedside Primary team communication: Yes. Dr. Hyacinth Meeker.   Author: Onnie Boer, MD 02/14/2022 11:03 PM  For on call review [email or web address].

## 2022-02-15 ENCOUNTER — Encounter (HOSPITAL_COMMUNITY): Payer: Self-pay | Admitting: Certified Registered Nurse Anesthetist

## 2022-02-15 ENCOUNTER — Ambulatory Visit (HOSPITAL_COMMUNITY): Payer: Medicare Other

## 2022-02-15 ENCOUNTER — Encounter (HOSPITAL_COMMUNITY): Payer: Self-pay

## 2022-02-15 ENCOUNTER — Encounter (HOSPITAL_COMMUNITY)
Admission: EM | Disposition: A | Discharge status: Other Acute Inpatient Hospital | Payer: Self-pay | Source: Home / Self Care | Attending: Emergency Medicine

## 2022-02-15 DIAGNOSIS — R17 Unspecified jaundice: Secondary | ICD-10-CM | POA: Diagnosis present

## 2022-02-15 DIAGNOSIS — N201 Calculus of ureter: Secondary | ICD-10-CM | POA: Diagnosis not present

## 2022-02-15 DIAGNOSIS — E119 Type 2 diabetes mellitus without complications: Secondary | ICD-10-CM | POA: Diagnosis not present

## 2022-02-15 DIAGNOSIS — K831 Obstruction of bile duct: Secondary | ICD-10-CM | POA: Diagnosis not present

## 2022-02-15 DIAGNOSIS — N3080 Other cystitis without hematuria: Secondary | ICD-10-CM | POA: Diagnosis not present

## 2022-02-15 DIAGNOSIS — R161 Splenomegaly, not elsewhere classified: Secondary | ICD-10-CM | POA: Diagnosis not present

## 2022-02-15 DIAGNOSIS — N1330 Unspecified hydronephrosis: Secondary | ICD-10-CM | POA: Diagnosis not present

## 2022-02-15 DIAGNOSIS — R8289 Other abnormal findings on cytological and histological examination of urine: Secondary | ICD-10-CM | POA: Diagnosis not present

## 2022-02-15 DIAGNOSIS — D649 Anemia, unspecified: Secondary | ICD-10-CM | POA: Diagnosis not present

## 2022-02-15 DIAGNOSIS — R160 Hepatomegaly, not elsewhere classified: Secondary | ICD-10-CM | POA: Diagnosis not present

## 2022-02-15 DIAGNOSIS — D72829 Elevated white blood cell count, unspecified: Secondary | ICD-10-CM | POA: Diagnosis not present

## 2022-02-15 DIAGNOSIS — Z7982 Long term (current) use of aspirin: Secondary | ICD-10-CM | POA: Diagnosis not present

## 2022-02-15 LAB — CBC
HCT: 27.3 % — ABNORMAL LOW (ref 39.0–52.0)
Hemoglobin: 9.9 g/dL — ABNORMAL LOW (ref 13.0–17.0)
MCH: 31.6 pg (ref 26.0–34.0)
MCHC: 36.3 g/dL — ABNORMAL HIGH (ref 30.0–36.0)
MCV: 87.2 fL (ref 80.0–100.0)
Platelets: 376 10*3/uL (ref 150–400)
RBC: 3.13 MIL/uL — ABNORMAL LOW (ref 4.22–5.81)
RDW: 19.1 % — ABNORMAL HIGH (ref 11.5–15.5)
WBC: 13.8 10*3/uL — ABNORMAL HIGH (ref 4.0–10.5)
nRBC: 0 % (ref 0.0–0.2)

## 2022-02-15 LAB — HEPATITIS PANEL, ACUTE
HCV Ab: NONREACTIVE
Hep A IgM: NONREACTIVE
Hep B C IgM: NONREACTIVE
Hepatitis B Surface Ag: NONREACTIVE

## 2022-02-15 LAB — COMPREHENSIVE METABOLIC PANEL
ALT: 184 U/L — ABNORMAL HIGH (ref 0–44)
AST: 173 U/L — ABNORMAL HIGH (ref 15–41)
Albumin: 2.1 g/dL — ABNORMAL LOW (ref 3.5–5.0)
Alkaline Phosphatase: 818 U/L — ABNORMAL HIGH (ref 38–126)
Anion gap: 6 (ref 5–15)
BUN: 16 mg/dL (ref 8–23)
CO2: 20 mmol/L — ABNORMAL LOW (ref 22–32)
Calcium: 8.6 mg/dL — ABNORMAL LOW (ref 8.9–10.3)
Chloride: 110 mmol/L (ref 98–111)
Creatinine, Ser: 0.87 mg/dL (ref 0.61–1.24)
GFR, Estimated: >60 mL/min (ref >60)
Glucose, Bld: 114 mg/dL — ABNORMAL HIGH (ref 70–99)
Potassium: 3.7 mmol/L (ref 3.5–5.1)
Sodium: 136 mmol/L (ref 135–145)
Total Bilirubin: 12.6 mg/dL — ABNORMAL HIGH (ref 0.3–1.2)
Total Protein: 6.6 g/dL (ref 6.5–8.1)

## 2022-02-15 LAB — HEMOGLOBIN A1C
Hgb A1c MFr Bld: 5.2 % (ref 4.8–5.6)
Mean Plasma Glucose: 103 mg/dL

## 2022-02-15 LAB — CBG MONITORING, ED
Glucose-Capillary: 103 mg/dL — ABNORMAL HIGH (ref 70–99)
Glucose-Capillary: 91 mg/dL (ref 70–99)

## 2022-02-15 SURGERY — CYSTOSCOPY, WITH RETROGRADE PYELOGRAM AND URETERAL STENT INSERTION
Anesthesia: Choice | Laterality: Left

## 2022-02-15 MED ORDER — DIATRIZOATE MEGLUMINE 30 % UR SOLN
URETHRAL | Status: AC
  Filled 2022-02-15: qty 100

## 2022-02-15 NOTE — Progress Notes (Signed)
PROGRESS NOTE   Bryan Vargas  ZOX:[id number] DOB: May 02, 1951 DOA: 02/14/2022 PCP: Zachery Dauer, MD   Chief Complaint  Patient presents with   Jaundice   Level of care:   Brief Admission History:  71 y.o. male with past medical history of asthma, diabetes mellitus, coronary artery disease kidney stones. Patient was sent to the ED reports of skin discoloration, and he was also seen his urologist for UTI.  Hhe reports pain with urination over the past week.  Was placed on cephalexin for UTI.  He denies abdominal pain.  He has been having generalized itching .  He also has some mild pain in his left lower abdomen. No vomiting.  No loose stools.  He has lost weight, but this is related to abdominal surgeries he has had. Pt currently holding in ED for transfer to [facility].     Assessment and Plan: Obstructive jaundice-elevated liver enzymes AST 197, ALT 229, ALP 976, total bilirubin 15.7.  CT showing moderate intra and extrahepatic bile duct dilatation with abrupt termination at the common bile duct at the level of the head of pancreas, differentials include malignant process, stricture, stone not visible on CT. -ED provider talked to Dr. Levon Hedger who recommended transfer to higher level of care, Dr. Marina Goodell at [facility] recommended transfer to tertiary center [facility] or [facility].  Order higher level of care due to history of GI surgery and gastric bypass procedure, hence patient needs higher level of care. - EDP called patient Tertiary facilities, patient was finally accepted at [facility], but no beds available at this time -Hospitalist called to admit.  Patient is clearly outside the scope of what [facility] is able to handle, needs urgent transfer to tertiary care center.  He is at risk for ascending cholangitis. -Continue IV Zosyn - L/R 75cc/hr x 15hrs -N.p.o. -IV Protonix 40 daily  Pt has a bed at [facility] and planning for transfer later today around 3 pm.     Bilateral hydronephrosis and  hydroureter, emphysematous cystitis- Follows with Dr. Annabell Howells.  UA suggestive of UTI with positive nitrite, moderate leukocytes, many bacteria.  Has a leukocytosis of 14.  Rules out for sepsis at this time.  CT abdomen and pelvis with contrast-new left hydronephrosis and hydroureter to the level of the distal left ureter, where there is a 5 mm distal left ureteral calculus.  CT also showing circumferential bladder wall thickening and intramural gas and intra luminal, no new right hydronephrosis and hydroureter with gas in the right renal collecting system and right ureter.  Patient is at risk for sepsis. -Last urine culture 01/28/22 grew pansensitive E. coli -Continue IV Zosyn -Follow-up urine cultures  ** I notified urologist Dr. Ronne Binning today and he has recommended stent placement and planning for later today.  Pt will be kept NPO for procedure.     BPH -Resume finasteride tamsulosin   Diabetes -Not on medication, monitor blood sugar every 8 hourly while n.p.o. - Hgba1c   Consultants:  Urology Dr. Ronne Binning  Procedures:  Pending stent placement  Antimicrobials:  Zosyn IV 6/25>>  Subjective: Pt has no specific complaints at this time.   Objective: Vitals:   02/15/22 0730 02/15/22 0800 02/15/22 0830 02/15/22 0900  BP: 115/61 118/68 113/70 120/73  Pulse: 94 75 74 67  Resp: 17  17 18   Temp:      TempSrc:      SpO2: 99% 96% 96% 97%  Weight:      Height:  Intake/Output Summary (Last 24 hours) at 02/15/2022 0945 Last data filed at 02/15/2022 0636 Gross per 24 hour  Intake 2073.33 ml  Output --  Net 2073.33 ml   Filed Weights   02/14/22 1420  Weight: 90.7 kg   Examination:  General exam: Appears calm and comfortable.  He is very jaundiced.  Respiratory system: Clear to auscultation. Respiratory effort normal. Cardiovascular system: normal S1 & S2 heard. No JVD, murmurs, rubs, gallops or clicks. No pedal edema. Gastrointestinal system: Abdomen is nondistended, soft. No  organomegaly or masses felt. Normal bowel sounds heard. Central nervous system: Alert and oriented. No focal neurological deficits. Extremities: Symmetric 5 x 5 power. Skin: No rashes, lesions or ulcers. Psychiatry: Judgement and insight appear normal. Mood & affect appropriate.   Data Reviewed: I have personally reviewed following labs and imaging studies  CBC: Recent Labs  Lab 02/14/22 1509 02/15/22 0613  WBC 14.1* 13.8*  NEUTROABS 11.0*  --   HGB 10.8* 9.9*  HCT 30.4* 27.3*  MCV 88.6 87.2  PLT 439* 376    Basic Metabolic Panel: Recent Labs  Lab 02/14/22 1509 02/15/22 0613  NA 137 136  K 3.9 3.7  CL 107 110  CO2 22 20*  GLUCOSE 146* 114*  BUN 22 16  CREATININE 0.99 0.87  CALCIUM 9.4 8.6*    CBG: No results for input(s): "GLUCAP" in the last 168 hours.  No results found for this or any previous visit (from the past 240 hour(s)).   Radiology Studies: CT ABDOMEN PELVIS W CONTRAST  Result Date: 02/14/2022 CLINICAL DATA:  Jaundice.  Remote history of pancreas mass. EXAM: CT ABDOMEN AND PELVIS WITH CONTRAST TECHNIQUE: Multidetector CT imaging of the abdomen and pelvis was performed using the standard protocol following bolus administration of intravenous contrast. RADIATION DOSE REDUCTION: This exam was performed according to the departmental dose-optimization program which includes automated exposure control, adjustment of the mA and/or kV according to patient size and/or use of iterative reconstruction technique. CONTRAST:  OMNIPAQUE IOHEXOL 300 MG/ML  SOLN COMPARISON:  03/19/2021 FINDINGS: Lower chest: No acute abnormality. Hepatobiliary: No focal liver lesion identified. The gallbladder appears diffusely distended containing intermediate attenuating fluid which may reflect sequelae of biliary stasis and or sludge. No calcified gallstones identified. Moderate diffuse intrahepatic bile duct dilatation is new from the previous exam. There is increase caliber of the CBD  which measures up to 1.3 cm. There is an abrupt termination of the common bile duct at the level of the head of pancreas, image 48/5. No obstructing stone identified. The diagnosis of exclusion is underlying pancreatic head mass versus cholangiocarcinoma. Pancreas: No pancreatic main duct dilatation identified. No pancreatic inflammation. There are several cystic lesions within the distal tail of pancreas. The largest measures 2.1 cm, image 29/2. Previously 2.0 cm. Spleen: Measures 13.7 by 12.9 x 6.7 cm (volume = 620 cm^3). Adrenals/Urinary Tract: Normal adrenal glands. Asymmetric right renal atrophy with new hydronephrosis is identified. Gas is identified within the dilated right renal collecting system, new when compared with 03/19/2021. there is right-sided hydroureter with gas up to the level of the urinary bladder. Multiple benign Bosniak class 1 left kidney cysts are identified. No follow-up imaging recommended. There is left-sided hydronephrosis and perinephric fat stranding. Hydroureter is identified to the level of the distal ureter where there is a 5 mm ureteral calculus, image 81/2. There is circumferential wall thickening of the bladder. Small foci of gas are noted within the wall of the bladder and there is also an  air-fluid level within the lumen of the bladder. Stomach/Bowel: Stomach appears normal. There is mild wall thickening involving the proximal duodenum. Soft tissue stranding is noted between the gallbladder, and duodenum. The appendix is not confidently identified. Scattered colonic diverticula without signs of acute diverticulitis. No bowel wall thickening, inflammation, or distension. Vascular/Lymphatic: Aortic atherosclerosis. Borderline enlarged gastrohepatic ligament lymph node measures 1.2 cm, image 23/2. Previously 0.8 cm. Reproductive: Marked prostate gland enlargement which measures 7.5 x 7.1 by 8.1 cm (volume = 230 cm^3). Other: No significant free fluid or fluid collections.  Musculoskeletal: No acute or significant osseous findings. IMPRESSION: 1. Interval development of moderate intrahepatic and extrahepatic bile duct dilatation with abrupt termination of the common bile duct at the level of the head of pancreas. Differential considerations include malignant process within the head of pancreas., common bile duct stricture, or obstructing stone, not visible on CT. When the patient is clinically stable, able to remain motionless, and perform breath hold technique more definitive characterization with contrast enhanced MRI/MRCP is recommended. 2. New left hydronephrosis and hydroureter to the level of the distal left ureter where there is a 5 mm distal left ureteral calculus. 3. Signs of emphysematous cystitis identified with circumferential bladder wall thickening, intramural gas and intraluminal gas. There is new right hydronephrosis and hydroureter with gas noted in the right renal collecting system and right ureter. 4. Marked prostate gland enlargement. 5. Gallbladder appears diffusely distended containing intermediate attenuating fluid which may reflect sequelae of biliary stasis and or sludge. No calcified gallstones identified. 6. Mild wall thickening involving the proximal duodenum. There is mild soft tissue stranding involving between the gallbladder and proximal duodenum. Underlying duodenitis cannot be. 7. No significant change in size of the cystic lesions within the distal tail of pancreas which measure up to 2.1 cm. Attention to the cyst on recommended follow-up MRI is advised. 8. Splenomegaly. 9. Aortic Atherosclerosis (ICD10-I70.0) and Emphysema (ICD10-J43.9). Electronically Signed   By: Signa Kell M.D.   On: 02/14/2022 16:49    Scheduled Meds:  finasteride  5 mg Oral Daily   heparin injection (subcutaneous)  5,000 Units Subcutaneous Q8H   pantoprazole (PROTONIX) IV  40 mg Intravenous QHS   tamsulosin  0.4 mg Oral QPC supper   Continuous Infusions:  lactated  ringers Stopped (02/15/22 0636)   piperacillin-tazobactam (ZOSYN)  IV 3.375 g (02/15/22 0916)    LOS: 0 days   Time spent: 38 mins  Loyal Holzheimer Laural Benes, MD How to contact the [facility] Attending or Consulting provider 7A - 7P or covering provider during after hours 7P -7A, for this patient?  Check the care team in [facility] and look for a) attending/consulting [facility] provider listed and b) the [facility] team listed Log into [email or web address] and use [facility]'s universal password to access. If you do not have the password, please contact the hospital operator. Locate the [facility] provider you are looking for under [facility] and page to a number that you can be directly reached. If you still have difficulty reaching the provider, please page the [facility] (Director on Call) for the Hospitalists listed on amion for assistance.  02/15/2022, 9:45 AM

## 2022-02-15 NOTE — ED Notes (Signed)
EDP states that patient may eat/drink since will not be transported until after shift change.

## 2022-02-15 NOTE — ED Notes (Signed)
Called and updated receiving nurse about patient eating at this time. States there are typically no procedures at this time of day and will be in am and NPO after MN.

## 2022-02-15 NOTE — ED Notes (Addendum)
Spoke with Dr. Ronne Binning and made him aware that patient wishes to speak with him prior to procedure. States that he will be over around 1230 to speak with patient at that time. Informed him that patient is requesting not to have procedure. MD informed this nurse of patient options. In room to speak with patient and he continues to voice desire to wait until he arrives at [facility] before anything is complete. Patient is in no pain and is afebrile. MD is aware. OR updated.

## 2022-02-17 LAB — URINE CULTURE: Culture: >=100000 — AB

## 2022-02-18 ENCOUNTER — Telehealth: Payer: Self-pay

## 2022-02-18 NOTE — Telephone Encounter (Signed)
Post ED Visit - Positive Culture Follow-up  Culture report reviewed by antimicrobial stewardship pharmacist: [place] Team '[x]'$  Luisa Hart, [place].D. '[]'$  Heide Guile, Pharm.D., BCPS AQ-ID '[]'$  Parks Neptune, Pharm.D., BCPS '[]'$  Alycia Rossetti, Pharm.D., BCPS '[]'$  [place], [place].D., BCPS, AAHIVP '[]'$  Legrand Como, Pharm.D., BCPS, AAHIVP '[]'$  Salome Arnt, PharmD, BCPS '[]'$  Johnnette Gourd, PharmD, BCPS '[]'$  Hughes Better, PharmD, BCPS '[]'$  Leeroy Cha, PharmD '[]'$  Laqueta Linden, PharmD, BCPS '[]'$  Albertina Parr, PharmD  [place] Team '[]'$  Leodis Sias, PharmD '[]'$  Lindell Spar, PharmD '[]'$  Royetta Asal, PharmD '[]'$  Graylin Shiver, Rph '[]'$  Rema Fendt) Glennon Mac, PharmD '[]'$  Arlyn Dunning, PharmD '[]'$  Netta Cedars, PharmD '[]'$  Dia Sitter, PharmD '[]'$  Leone Haven, PharmD '[]'$  Gretta Arab, PharmD '[]'$  Theodis Shove, PharmD '[]'$  Peggyann Juba, PharmD '[]'$  Reuel Boom, PharmD   Positive urine culture Treated with Zosyn at [facility],  Unable to fax urine culture results to [facility] because pt was discharged home earlier today.   Glennon Hamilton 02/18/2022, 4:48 PM

## 2022-03-11 ENCOUNTER — Ambulatory Visit: Payer: Medicare Other | Admitting: Urology

## 2022-03-15 ENCOUNTER — Telehealth: Payer: Self-pay

## 2022-03-15 NOTE — Telephone Encounter (Signed)
75 min colon EMR in October with GM for history of polyps

## 2022-03-16 ENCOUNTER — Other Ambulatory Visit: Payer: Self-pay

## 2022-03-16 DIAGNOSIS — Z86010 Personal history of colonic polyps: Secondary | ICD-10-CM

## 2022-03-16 MED ORDER — PEG 3350-KCL-NA BICARB-NACL 420 G PO SOLR: 4000.0000 mL | Freq: Once | ORAL | 0 refills | Status: AC

## 2022-03-16 NOTE — Telephone Encounter (Signed)
Patty, Please let the patient know that I have reviewed his chart, I had not heard of any of his recent issues but and thankful that he is getting work-up with some of my prior colleagues at [facility]. He can hold on colonoscopy with EMR at this time.  Put in a recall for another 6 months please. Thanks. GM

## 2022-03-16 NOTE — Telephone Encounter (Signed)
Pt aware and recall entered

## 2022-03-16 NOTE — Telephone Encounter (Signed)
The pt is having a Whipple procedure done in a few weeks and would like to cancel the colon EMR for now and call back when he is able to proceed.

## 2022-03-16 NOTE — Telephone Encounter (Signed)
Colon EMR scheduled for 9/18 at 1130 am at [facility] with GM

## 2022-03-16 NOTE — Telephone Encounter (Signed)
Left message on machine to call back  

## 2022-03-19 ENCOUNTER — Emergency Department (HOSPITAL_COMMUNITY): Payer: Medicare Other

## 2022-03-19 ENCOUNTER — Emergency Department (HOSPITAL_COMMUNITY)
Admission: EM | Admit: 2022-03-19 | Discharge: 2022-03-20 | Disposition: A | Discharge status: Other Acute Inpatient Hospital | Payer: Medicare Other | Attending: Emergency Medicine | Admitting: Emergency Medicine

## 2022-03-19 ENCOUNTER — Encounter (HOSPITAL_COMMUNITY): Payer: Self-pay | Admitting: *Deleted

## 2022-03-19 ENCOUNTER — Other Ambulatory Visit: Payer: Self-pay

## 2022-03-19 DIAGNOSIS — Z7982 Long term (current) use of aspirin: Secondary | ICD-10-CM | POA: Insufficient documentation

## 2022-03-19 DIAGNOSIS — R112 Nausea with vomiting, unspecified: Secondary | ICD-10-CM | POA: Insufficient documentation

## 2022-03-19 DIAGNOSIS — I2510 Atherosclerotic heart disease of native coronary artery without angina pectoris: Secondary | ICD-10-CM | POA: Insufficient documentation

## 2022-03-19 DIAGNOSIS — R109 Unspecified abdominal pain: Secondary | ICD-10-CM | POA: Insufficient documentation

## 2022-03-19 DIAGNOSIS — K819 Cholecystitis, unspecified: Secondary | ICD-10-CM

## 2022-03-19 LAB — CBC WITH DIFFERENTIAL/PLATELET
Abs Immature Granulocytes: 0.11 10*3/uL — ABNORMAL HIGH (ref 0.00–0.07)
Basophils Absolute: 0 10*3/uL (ref 0.0–0.1)
Basophils Relative: 0 %
Eosinophils Absolute: 0 10*3/uL (ref 0.0–0.5)
Eosinophils Relative: 0 %
HCT: 34.6 % — ABNORMAL LOW (ref 39.0–52.0)
Hemoglobin: 11.8 g/dL — ABNORMAL LOW (ref 13.0–17.0)
Immature Granulocytes: 1 %
Lymphocytes Relative: 3 %
Lymphs Abs: 0.6 10*3/uL — ABNORMAL LOW (ref 0.7–4.0)
MCH: 32.6 pg (ref 26.0–34.0)
MCHC: 34.1 g/dL (ref 30.0–36.0)
MCV: 95.6 fL (ref 80.0–100.0)
Monocytes Absolute: 1.7 10*3/uL — ABNORMAL HIGH (ref 0.1–1.0)
Monocytes Relative: 9 %
Neutro Abs: 16.9 10*3/uL — ABNORMAL HIGH (ref 1.7–7.7)
Neutrophils Relative %: 87 %
Platelets: 189 10*3/uL (ref 150–400)
RBC: 3.62 MIL/uL — ABNORMAL LOW (ref 4.22–5.81)
RDW: 15.4 % (ref 11.5–15.5)
WBC: 19.3 10*3/uL — ABNORMAL HIGH (ref 4.0–10.5)
nRBC: 0 % (ref 0.0–0.2)

## 2022-03-19 LAB — COMPREHENSIVE METABOLIC PANEL
ALT: 96 U/L — ABNORMAL HIGH (ref 0–44)
AST: 111 U/L — ABNORMAL HIGH (ref 15–41)
Albumin: 3 g/dL — ABNORMAL LOW (ref 3.5–5.0)
Alkaline Phosphatase: 280 U/L — ABNORMAL HIGH (ref 38–126)
Anion gap: 9 (ref 5–15)
BUN: 24 mg/dL — ABNORMAL HIGH (ref 8–23)
CO2: 23 mmol/L (ref 22–32)
Calcium: 8.9 mg/dL (ref 8.9–10.3)
Chloride: 97 mmol/L — ABNORMAL LOW (ref 98–111)
Creatinine, Ser: 1.65 mg/dL — ABNORMAL HIGH (ref 0.61–1.24)
GFR, Estimated: 44 mL/min — ABNORMAL LOW (ref >60)
Glucose, Bld: 130 mg/dL — ABNORMAL HIGH (ref 70–99)
Potassium: 4.4 mmol/L (ref 3.5–5.1)
Sodium: 129 mmol/L — ABNORMAL LOW (ref 135–145)
Total Bilirubin: 3.6 mg/dL — ABNORMAL HIGH (ref 0.3–1.2)
Total Protein: 7.1 g/dL (ref 6.5–8.1)

## 2022-03-19 LAB — LIPASE, BLOOD: Lipase: 41 U/L (ref 11–51)

## 2022-03-19 MED ORDER — HYDROMORPHONE HCL 1 MG/ML IJ SOLN
0.5000 mg | Freq: Once | INTRAMUSCULAR | Status: AC
  Administered 2022-03-20: 0.5 mg via INTRAVENOUS
  Filled 2022-03-19: qty 0.5

## 2022-03-19 MED ORDER — PIPERACILLIN-TAZOBACTAM 3.375 G IVPB 30 MIN
3.3750 g | Freq: Once | INTRAVENOUS | Status: AC
  Administered 2022-03-20: 3.375 g via INTRAVENOUS
  Filled 2022-03-19: qty 50

## 2022-03-19 MED ORDER — SODIUM CHLORIDE 0.9 % IV BOLUS: 500.0000 mL | Freq: Once | INTRAVENOUS | Status: DC

## 2022-03-19 MED ORDER — SODIUM CHLORIDE 0.9 % IV BOLUS
1000.0000 mL | Freq: Once | INTRAVENOUS | Status: AC
  Administered 2022-03-19: 1000 mL via INTRAVENOUS

## 2022-03-19 MED ORDER — ONDANSETRON HCL 4 MG/2ML IJ SOLN
4.0000 mg | Freq: Once | INTRAMUSCULAR | Status: AC
  Administered 2022-03-19: 4 mg via INTRAVENOUS
  Filled 2022-03-19: qty 2

## 2022-03-19 MED ORDER — IOHEXOL 300 MG/ML  SOLN
100.0000 mL | Freq: Once | INTRAMUSCULAR | Status: AC | PRN
  Administered 2022-03-19: 75 mL via INTRAVENOUS

## 2022-03-19 MED ORDER — SODIUM CHLORIDE 0.9 % IV BOLUS
1000.0000 mL | Freq: Once | INTRAVENOUS | Status: AC
  Administered 2022-03-20: 1000 mL via INTRAVENOUS

## 2022-03-19 NOTE — ED Triage Notes (Addendum)
Pt had a stent placed to bile duct and pancreas at [facility], vomited yesterday and noted stent in emesis. Surgeon instructed pt to come here for CT.

## 2022-03-20 MED ORDER — SODIUM CHLORIDE 0.9 % IV SOLN: INTRAVENOUS | Status: DC

## 2022-03-20 NOTE — ED Notes (Signed)
Pt is only allowed ice chips per night EDP.

## 2022-03-20 NOTE — ED Notes (Signed)
Pt does not want to change into gown.

## 2022-03-20 NOTE — ED Provider Notes (Signed)
[facility] EMERGENCY DEPARTMENT Provider Note   CSN: [id number] Arrival date & time: 03/19/22  2007     History {Add pertinent medical, surgical, social history, OB history to HPI:1} Chief Complaint  Patient presents with   Emesis    Bryan Vargas is a 71 y.o. male.  Patient has a history of coronary artery disease diabetes and bilateral biliary stent.  He complains of nausea and vomiting and abdominal pain for 1 day  The history is provided by the patient and medical records. No language interpreter was used.  Emesis Severity:  Mild Timing:  Intermittent Quality:  Stomach contents Able to tolerate:  Liquids Progression:  Worsening Chronicity:  New Recent urination:  Normal Relieved by:  Nothing Worsened by:  Nothing Ineffective treatments:  None tried Associated symptoms: abdominal pain   Associated symptoms: no cough, no diarrhea and no headaches        Home Medications Prior to Admission medications   Medication Sig Start Date End Date Taking? Authorizing Provider  albuterol (VENTOLIN HFA) 108 (90 Base) MCG/ACT inhaler Inhale 1-2 puffs into the lungs every 6 (six) hours as needed for shortness of breath. 09/30/21   [provider]  aspirin EC 81 MG tablet Take 81 mg by mouth daily. Swallow whole.    [provider]  azelastine (ASTELIN) 0.1 % nasal spray Place 1 spray into both nostrils 2 (two) times daily as needed for rhinitis. 07/14/20   [provider]  cephALEXin (KEFLEX) 500 MG capsule Take 1 capsule (500 mg total) by mouth 3 (three) times daily. Patient not taking: Reported on 02/14/2022 02/02/22   Irine Seal, MD  cephALEXin (KEFLEX) 500 MG capsule Take 1 capsule (500 mg total) by mouth at bedtime. Start after completion of 7 day course to prevent recurrent UTI Patient not taking: Reported on 02/14/2022 02/02/22   Irine Seal, MD  finasteride (PROSCAR) 5 MG tablet Take 1 tablet (5 mg total) by mouth daily. 07/23/21   Irine Seal, MD   latanoprost (XALATAN) 0.005 % ophthalmic solution Place 1 drop into both eyes at bedtime. 07/03/20   [provider]  Multiple Vitamins-Minerals (CENTRUM ADULTS PO) Take 1 tablet by mouth daily.    [provider]  pantoprazole (PROTONIX) 40 MG tablet TAKE 1 TABLET BY MOUTH TWICE A DAY Patient taking differently: Take 40 mg by mouth 2 (two) times daily. 09/03/21   Mansouraty, Telford Nab., MD  tamsulosin (FLOMAX) 0.4 MG CAPS capsule Take 1 capsule (0.4 mg total) by mouth daily after supper. 07/23/21   Irine Seal, MD      Allergies    Patient has no known allergies.    Review of Systems   Review of Systems  Constitutional:  Negative for appetite change and fatigue.  HENT:  Negative for congestion, ear discharge and sinus pressure.   Eyes:  Negative for discharge.  Respiratory:  Negative for cough.   Cardiovascular:  Negative for chest pain.  Gastrointestinal:  Positive for abdominal pain and vomiting. Negative for diarrhea.  Genitourinary:  Negative for frequency and hematuria.  Musculoskeletal:  Negative for back pain.  Skin:  Negative for rash.  Neurological:  Negative for seizures and headaches.  Psychiatric/Behavioral:  Negative for hallucinations.     Physical Exam Updated Vital Signs BP 105/63   Pulse 97   Temp 97.6 F (36.4 C) (Oral)   Resp (!) 30   Ht '6\' 3"'$  (1.905 m)   Wt 89.3 kg   SpO2 96%   BMI 24.61 kg/m  Physical Exam Vitals and nursing note reviewed.  Constitutional:      Appearance: He is well-developed.  HENT:     Head: Normocephalic.     Nose: Nose normal.  Eyes:     General: No scleral icterus.    Conjunctiva/sclera: Conjunctivae normal.  Neck:     Thyroid: No thyromegaly.  Cardiovascular:     Rate and Rhythm: Normal rate and regular rhythm.     Heart sounds: No murmur heard.    No friction rub. No gallop.  Pulmonary:     Breath sounds: No stridor. No wheezing or rales.  Chest:     Chest wall: No tenderness.  Abdominal:      General: There is no distension.     Tenderness: There is abdominal tenderness. There is no rebound.  Musculoskeletal:        General: Normal range of motion.     Cervical back: Neck supple.  Lymphadenopathy:     Cervical: No cervical adenopathy.  Skin:    Findings: No erythema or rash.  Neurological:     Mental Status: He is alert and oriented to person, place, and time.     Motor: No abnormal muscle tone.     Coordination: Coordination normal.  Psychiatric:        Behavior: Behavior normal.     ED Results / Procedures / Treatments   Labs (all labs ordered are listed, but only abnormal results are displayed) Labs Reviewed  CBC WITH DIFFERENTIAL/PLATELET - Abnormal; Notable for the following components:      Result Value   WBC 19.3 (*)    RBC 3.62 (*)    Hemoglobin 11.8 (*)    HCT 34.6 (*)    Neutro Abs 16.9 (*)    Lymphs Abs 0.6 (*)    Monocytes Absolute 1.7 (*)    Abs Immature Granulocytes 0.11 (*)    All other components within normal limits  COMPREHENSIVE METABOLIC PANEL - Abnormal; Notable for the following components:   Sodium 129 (*)    Chloride 97 (*)    Glucose, Bld 130 (*)    BUN 24 (*)    Creatinine, Ser 1.65 (*)    Albumin 3.0 (*)    AST 111 (*)    ALT 96 (*)    Alkaline Phosphatase 280 (*)    Total Bilirubin 3.6 (*)    GFR, Estimated 44 (*)    All other components within normal limits  LIPASE, BLOOD    EKG None  Radiology CT ABDOMEN PELVIS W CONTRAST  Result Date: 03/19/2022 CLINICAL DATA:  Recent stent placement vomiting EXAM: CT ABDOMEN AND PELVIS WITH CONTRAST TECHNIQUE: Multidetector CT imaging of the abdomen and pelvis was performed using the standard protocol following bolus administration of intravenous contrast. RADIATION DOSE REDUCTION: This exam was performed according to the departmental dose-optimization program which includes automated exposure control, adjustment of the mA and/or kV according to patient size and/or use of iterative  reconstruction technique. CONTRAST:  11m OMNIPAQUE IOHEXOL 300 MG/ML  SOLN COMPARISON:  CT 02/14/2022, 03/19/2021, 02/25/2021, 09/30/2020 FINDINGS: Lower chest: Lung bases demonstrate no acute airspace disease. Hepatobiliary: Marked gallbladder distension. Soft tissue stranding evident in the right upper quadrant. Mild intra hepatic biliary dilatation, decreased compared to the CT from June. Interim placement of biliary stent with proximal aspect in the common duct and distal aspect in the duodenum. There is decreased common bile duct dilatation since the previous CT. Pancreas: No inflammatory changes. Multiple cystic lesions at the pancreatic  tail. Dominant lesion measures 3 cm, compared with 2.1 cm previously. There may be some mural nodularity at the periphery of the lesion. Spleen: Normal in size without focal abnormality. Adrenals/Urinary Tract: Adrenal glands are within normal limits. Percutaneous nephrostomy tube on the left. No hydronephrosis. Multiple left renal cysts. Slightly dense midpole lesion measuring 2.6 cm, series 2, image 42, the site of previously noted more benign-appearing cystic lesion. Atrophic right kidney with gas in the right renal collecting system. Small amount of air in the urinary bladder. Stomach/Bowel: The stomach demonstrates moderate fluid distension. Thickened appearance of the pylorus with mucosal enhancement. Irregular narrowing of the descending duodenum. Scattered fluid-filled mildly distended small bowel loops without well-defined point of obstruction. New wall thickening involving the hepatic flexure with adjacent right upper quadrant inflammatory process. Vascular/Lymphatic: Mild atherosclerosis. No aneurysm. Borderline gastrohepatic lymph node measuring 12 mm, 13 mm previously. Reproductive: Enlarged lobulated prostate with calcifications. Other: Negative for pelvic effusion. Small volume free fluid in the right upper quadrant. Small fat containing inguinal hernias. No  free air. Small fat containing umbilical hernia Musculoskeletal: Degenerative changes. No acute osseous abnormality. IMPRESSION: 1. There is been interim placement of a biliary stent with slight decreased intra hepatic biliary dilatation and decreased common bile duct dilatation. There is persistent marked gallbladder distension, now with interval right upper quadrant inflammatory process. There is asymmetric wall thickening of the hepatic flexure of the colon, uncertain if the right upper quadrant inflammatory process is due to gallbladder inflammation or a colon inflammatory process such as colitis from infection, inflammation or ischemia. There is new small volume perihepatic and right upper quadrant ascites. 2. Moderate fluid distension of the stomach. Pylorus wall thickening with mucosal enhancement which may be due to inflammation or a mass. There is irregular duodenal narrowing suspect for a stricture which is either inflammatory or neoplastic in etiology. 3. Slight interval increase in size pancreatic tail cystic lesion, now with possible mural nodularity. 4. Atrophic right kidney. Decreased right hydronephrosis and hydroureter but with persistent gas in the renal collecting system and bladder. 5. Left percutaneous nephrostomy without hydronephrosis. Complex slightly dense midpole renal lesion in the interim, this is at the site of a previously noted larger benign-appearing cyst and could relate to inflammatory or hemorrhagic cyst 6. Marked enlargement of the prostate Electronically Signed   By: Donavan Foil M.D.   On: 03/19/2022 23:34    Procedures Procedures  {Document cardiac monitor, telemetry assessment procedure when appropriate:1}  Medications Ordered in ED Medications  sodium chloride 0.9 % bolus 1,000 mL (has no administration in time range)  HYDROmorphone (DILAUDID) injection 0.5 mg (has no administration in time range)  piperacillin-tazobactam (ZOSYN) IVPB 3.375 g (has no administration  in time range)  sodium chloride 0.9 % bolus 1,000 mL (0 mLs Intravenous Stopped 03/19/22 2301)  ondansetron (ZOFRAN) injection 4 mg (4 mg Intravenous Given 03/19/22 2106)  iohexol (OMNIPAQUE) 300 MG/ML solution 100 mL (75 mLs Intravenous Contrast Given 03/19/22 2250)  sodium chloride 0.9 % bolus 1,000 mL (0 mLs Intravenous Stopped 03/20/22 0003)    ED Course/ Medical Decision Making/ A&P  CRITICAL CARE Performed by: Milton Ferguson Total critical care time: 40 minutes Critical care time was exclusive of separately billable procedures and treating other patients. Critical care was necessary to treat or prevent imminent or life-threatening deterioration. Critical care was time spent personally by me on the following activities: development of treatment plan with patient and/or surrogate as well as nursing, discussions with consultants, evaluation of patient's response  to treatment, examination of patient, obtaining history from patient or surrogate, ordering and performing treatments and interventions, ordering and review of laboratory studies, ordering and review of radiographic studies, pulse oximetry and re-evaluation of patient's condition.   Patient with abdominal pain nausea and vomiting.  CT of the abdomen is suggesting cholecystitis.  Patient has elevated white count.  I spoke with his oncology surgeon Dr. Hyman Hopes and he states he wants the patient at [facility].  He stated that if there are no beds then he needs to be an ED to ED transfer.  Patient has been given [place] Making Amount and/or Complexity of Data Reviewed Labs: ordered. Radiology: ordered.  Risk Prescription drug management.   Abdominal pain and possible cholecystitis with a history of a biliary stent  {Document critical care time when appropriate:1} {Document review of labs and clinical decision tools ie heart score, Chads2Vasc2 etc:1}  {Document your independent review of  radiology images, and any outside records:1} {Document your discussion with family members, caretakers, and with consultants:1} {Document social determinants of health affecting pt's care:1} {Document your decision making why or why not admission, treatments were needed:1} Final Clinical Impression(s) / ED Diagnoses Final diagnoses:  None    Rx / DC Orders ED Discharge Orders     None

## 2022-03-20 NOTE — ED Notes (Signed)
Pt will be ED to ED transport, transport will not be available until midday tomorrow.

## 2022-04-26 ENCOUNTER — Encounter (HOSPITAL_COMMUNITY): Payer: Self-pay | Admitting: Emergency Medicine

## 2022-04-26 ENCOUNTER — Other Ambulatory Visit: Payer: Self-pay

## 2022-04-26 ENCOUNTER — Emergency Department (HOSPITAL_COMMUNITY)
Admission: EM | Admit: 2022-04-26 | Discharge: 2022-04-26 | Disposition: A | Discharge status: Home / Self Care | Payer: Medicare Other | Attending: Emergency Medicine | Admitting: Emergency Medicine

## 2022-04-26 DIAGNOSIS — Z7982 Long term (current) use of aspirin: Secondary | ICD-10-CM | POA: Diagnosis not present

## 2022-04-26 DIAGNOSIS — R339 Retention of urine, unspecified: Secondary | ICD-10-CM | POA: Diagnosis not present

## 2022-04-26 DIAGNOSIS — E119 Type 2 diabetes mellitus without complications: Secondary | ICD-10-CM | POA: Insufficient documentation

## 2022-04-26 LAB — CBC WITH DIFFERENTIAL/PLATELET
Abs Immature Granulocytes: 0.03 10*3/uL (ref 0.00–0.07)
Basophils Absolute: 0.1 10*3/uL (ref 0.0–0.1)
Basophils Relative: 1 %
Eosinophils Absolute: 0.2 10*3/uL (ref 0.0–0.5)
Eosinophils Relative: 2 %
HCT: 29.4 % — ABNORMAL LOW (ref 39.0–52.0)
Hemoglobin: 9.6 g/dL — ABNORMAL LOW (ref 13.0–17.0)
Immature Granulocytes: 0 %
Lymphocytes Relative: 12 %
Lymphs Abs: 1.1 10*3/uL (ref 0.7–4.0)
MCH: 31.4 pg (ref 26.0–34.0)
MCHC: 32.7 g/dL (ref 30.0–36.0)
MCV: 96.1 fL (ref 80.0–100.0)
Monocytes Absolute: 0.7 10*3/uL (ref 0.1–1.0)
Monocytes Relative: 8 %
Neutro Abs: 6.8 10*3/uL (ref 1.7–7.7)
Neutrophils Relative %: 77 %
Platelets: 260 10*3/uL (ref 150–400)
RBC: 3.06 MIL/uL — ABNORMAL LOW (ref 4.22–5.81)
RDW: 13.9 % (ref 11.5–15.5)
WBC: 8.9 10*3/uL (ref 4.0–10.5)
nRBC: 0 % (ref 0.0–0.2)

## 2022-04-26 LAB — URINALYSIS, ROUTINE W REFLEX MICROSCOPIC
Bilirubin Urine: NEGATIVE
Glucose, UA: NEGATIVE mg/dL
Ketones, ur: NEGATIVE mg/dL
Nitrite: NEGATIVE
Protein, ur: 30 mg/dL — AB
RBC / HPF: >50 RBC/hpf — ABNORMAL HIGH (ref 0–5)
Specific Gravity, Urine: 1.01 (ref 1.005–1.030)
WBC, UA: >50 WBC/hpf — ABNORMAL HIGH (ref 0–5)
pH: 6 (ref 5.0–8.0)

## 2022-04-26 LAB — BASIC METABOLIC PANEL
Anion gap: 9 (ref 5–15)
BUN: 15 mg/dL (ref 8–23)
CO2: 25 mmol/L (ref 22–32)
Calcium: 9.4 mg/dL (ref 8.9–10.3)
Chloride: 106 mmol/L (ref 98–111)
Creatinine, Ser: 1.04 mg/dL (ref 0.61–1.24)
GFR, Estimated: >60 mL/min (ref >60)
Glucose, Bld: 107 mg/dL — ABNORMAL HIGH (ref 70–99)
Potassium: 3.9 mmol/L (ref 3.5–5.1)
Sodium: 140 mmol/L (ref 135–145)

## 2022-04-26 NOTE — ED Notes (Signed)
Patient and family educated on foley care and leg bag use. Patient and family verbalized understanding.

## 2022-04-26 NOTE — ED Provider Notes (Signed)
[facility] EMERGENCY DEPARTMENT Provider Note   CSN: [id number] Arrival date & time: 04/26/22  1127     History  Chief Complaint  Patient presents with   Urinary Retention    Bryan Vargas is a 71 y.o. male with medical history to include diabetes, GERD, esophageal varices, PUD requiring gastrojejunostomy, multifocal pancreatic cystic lesions, biliary stricture status post stent and history of nephrolithiasis with recent lithotripsy done 3 days ago.  Patient first presented to [facility] for jaundice with incidental finding of 3 mm obstructing left UVJ stone.  He had left PCN placement due to concern of ureteral stone with superimposed infection.  PCN placed over stent given extremely large size of prostate.  His left PCN was exchanged [id number] for nonfunctioning nephrostomy tube.  He does have an atrophic right kidney.  Lithotripsy done 04/23/2022 on 5 mm stone in the left UVJ.  Patient was able to urinate after the surgery, but states that urination "slowed down" the following day at home.  Yesterday, he had significant trouble with urination, only producing small amounts at a time.  Denies dysuria.  He denies chest pain, shortness of breath, abdominal pain, nausea, vomiting, diarrhea, fevers and chills.  HPI     Home Medications Prior to Admission medications   Medication Sig Start Date End Date Taking? Authorizing Provider  albuterol (VENTOLIN HFA) 108 (90 Base) MCG/ACT inhaler Inhale 1-2 puffs into the lungs every 6 (six) hours as needed for shortness of breath. 09/30/21   [provider]  aspirin EC 81 MG tablet Take 81 mg by mouth daily. Swallow whole.    [provider]  azelastine (ASTELIN) 0.1 % nasal spray Place 1 spray into both nostrils 2 (two) times daily as needed for rhinitis. 07/14/20   [provider]  cephALEXin (KEFLEX) 500 MG capsule Take 1 capsule (500 mg total) by mouth 3 (three) times daily. Patient not taking: Reported on 02/14/2022 02/02/22    Irine Seal, MD  finasteride (PROSCAR) 5 MG tablet Take 1 tablet (5 mg total) by mouth daily. 07/23/21   Irine Seal, MD  latanoprost (XALATAN) 0.005 % ophthalmic solution Place 1 drop into both eyes at bedtime. 07/03/20   [provider]  latanoprost (XALATAN) 0.005 % ophthalmic solution Apply to eye. 10/07/17   [provider]  Multiple Vitamins-Minerals (CENTRUM ADULTS PO) Take 1 tablet by mouth daily.    [provider]  pantoprazole (PROTONIX) 40 MG tablet TAKE 1 TABLET BY MOUTH TWICE A DAY Patient taking differently: Take 40 mg by mouth 2 (two) times daily. 09/03/21   Mansouraty, Telford Nab., MD  sulfamethoxazole-trimethoprim (BACTRIM DS) 800-160 MG tablet Take 1 tablet by mouth 2 (two) times daily. Patient not taking: Reported on 03/20/2022 02/18/22   [provider]  tamsulosin (FLOMAX) 0.4 MG CAPS capsule Take 1 capsule (0.4 mg total) by mouth daily after supper. 07/23/21   Irine Seal, MD      Allergies    Patient has no known allergies.    Review of Systems   Review of Systems  Constitutional:  Negative for fever.  Respiratory:  Negative for shortness of breath.   Gastrointestinal:  Negative for abdominal pain, diarrhea, nausea and vomiting.  Genitourinary:  Positive for difficulty urinating. Negative for dysuria.    Physical Exam Updated Vital Signs BP (!) 132/101 (BP Location: Right Arm)   Pulse 71   Temp 98.2 F (36.8 C) (Oral)   Resp 18   Ht '6\' 3"'$  (1.905 m)  Wt 89.3 kg   SpO2 96%   BMI 24.61 kg/m  Physical Exam Vitals and nursing note reviewed.  Constitutional:      General: He is not in acute distress.    Appearance: He is not ill-appearing.  HENT:     Head: Atraumatic.  Eyes:     Conjunctiva/sclera: Conjunctivae normal.  Cardiovascular:     Rate and Rhythm: Normal rate and regular rhythm.     Pulses: Normal pulses.     Heart sounds: No murmur heard. Pulmonary:     Effort: Pulmonary effort is normal. No respiratory  distress.     Breath sounds: Normal breath sounds.  Abdominal:     General: Abdomen is flat. There is no distension.     Palpations: Abdomen is soft.     Tenderness: There is no abdominal tenderness. There is no right CVA tenderness or left CVA tenderness.     Comments: Numerous, well-healing surgical scars including midline ex lap scar  Musculoskeletal:        General: Normal range of motion.     Cervical back: Normal range of motion.  Skin:    General: Skin is warm and dry.     Capillary Refill: Capillary refill takes less than 2 seconds.  Neurological:     General: No focal deficit present.     Mental Status: He is alert.  Psychiatric:        Mood and Affect: Mood normal.     ED Results / Procedures / Treatments   Labs (all labs ordered are listed, but only abnormal results are displayed) Labs Reviewed  BASIC METABOLIC PANEL - Abnormal; Notable for the following components:      Result Value   Glucose, Bld 107 (*)    All other components within normal limits  CBC WITH DIFFERENTIAL/PLATELET - Abnormal; Notable for the following components:   RBC 3.06 (*)    Hemoglobin 9.6 (*)    HCT 29.4 (*)    All other components within normal limits  URINALYSIS, ROUTINE W REFLEX MICROSCOPIC - Abnormal; Notable for the following components:   APPearance CLOUDY (*)    Hgb urine dipstick LARGE (*)    Protein, ur 30 (*)    Leukocytes,Ua SMALL (*)    RBC / HPF >50 (*)    WBC, UA >50 (*)    Bacteria, UA RARE (*)    All other components within normal limits  URINE CULTURE    EKG None  Radiology No results found.  Procedures Procedures    Medications Ordered in ED Medications - No data to display  ED Course/ Medical Decision Making/ A&P                           Medical Decision Making Amount and/or Complexity of Data Reviewed Labs: ordered.   Social determinants of health:  Social History   Socioeconomic History   Marital status: Single    Spouse name: Not on file    Number of children: 2   Years of education: Not on file   Highest education level: Not on file  Occupational History   Occupation: retired  Tobacco Use   Smoking status: Never   Smokeless tobacco: Never  Vaping Use   Vaping Use: Never used  Substance and Sexual Activity   Alcohol use: Not Currently    Comment: ocassional - beer   Drug use: Never   Sexual activity: Not on file  Other Topics  Concern   Not on file  Social History Narrative   Not on file   Social Determinants of Health   Financial Resource Strain: Not on file  Food Insecurity: Not on file  Transportation Needs: Not on file  Physical Activity: Not on file  Stress: Not on file  Social Connections: Not on file  Intimate Partner Violence: Not on file     Initial impression:  This patient presents to the ED for concern of urinary retention, this involves an extensive number of treatment options, and is a complaint that carries with it a high risk of complications and morbidity.   Differentials include kidney stone, nephrostomy dislodgment, AKI.   Comorbidities affecting care:  Numerous, refer to HPI  Additional history obtained: Wife, chart review  Lab Tests  I Ordered, reviewed, and interpreted labs and EKG.  The pertinent results include:  BUN and creatinine normal Large amounts of leukocytes and RBCs on UA-culture sent   ED Course/Re-evaluation: Patient is overall well-appearing and in no acute distress.  Vitals without significant abnormality.  On exam there is no CVA tenderness, abdomen is soft, compressible and nontender.  Bladder scan identified greater than 1 L within bladder.  Foley catheter successfully placed and drained approximately 2300 mL.  I obtain basic labs to rule out associated AKI due to the urinary retention.  BUN and creatinine were normal.  Urinalysis with large amounts of leukocytes and RBCs, so culture was sent out.  Discussed case with my attending, Dr. Rogene Houston who agrees with  plan to discharge patient home with Foley catheter and follow-up with his urologist at [facility] tomorrow.  Patient and wife expressed understanding are amenable to plan.  Disposition:  After consideration of the diagnostic results, physical exam, history and the patients response to treatment feel that the patent would benefit from discharge.   Urinary retention: Plan and management as described above. Discharged home in good condition.  Final Clinical Impression(s) / ED Diagnoses Final diagnoses:  Urinary retention    Rx / DC Orders ED Discharge Orders     None         Tonye Pearson, PA-C 04/26/22 1419    Fredia Sorrow, MD 04/26/22 [phone]

## 2022-04-26 NOTE — ED Triage Notes (Signed)
Pt to the ED with urinary retention since a kidney stone was removed at [facility] on Saturday.  Pt has urinated but it has been a few drops at a time.

## 2022-04-26 NOTE — ED Notes (Signed)
Bladder Scan Amount 1073m per NT

## 2022-04-26 NOTE — Discharge Instructions (Addendum)
I have sent you home with a Foley catheter, but please call your urologist in the morning to schedule follow-up.  Typically these stay in for about 5 days.  Return if you have worsening symptoms and trouble with your catheter.

## 2022-04-27 LAB — URINE CULTURE: Culture: NO GROWTH

## 2022-04-30 ENCOUNTER — Emergency Department (HOSPITAL_COMMUNITY)
Admission: EM | Admit: 2022-04-30 | Discharge: 2022-05-01 | Disposition: A | Discharge status: Home / Self Care | Payer: Medicare Other | Attending: Emergency Medicine | Admitting: Emergency Medicine

## 2022-04-30 ENCOUNTER — Encounter (HOSPITAL_COMMUNITY): Payer: Self-pay | Admitting: *Deleted

## 2022-04-30 ENCOUNTER — Emergency Department (HOSPITAL_COMMUNITY): Payer: Medicare Other

## 2022-04-30 ENCOUNTER — Other Ambulatory Visit: Payer: Self-pay

## 2022-04-30 DIAGNOSIS — R319 Hematuria, unspecified: Secondary | ICD-10-CM

## 2022-04-30 DIAGNOSIS — Z79899 Other long term (current) drug therapy: Secondary | ICD-10-CM | POA: Diagnosis not present

## 2022-04-30 DIAGNOSIS — R339 Retention of urine, unspecified: Secondary | ICD-10-CM

## 2022-04-30 DIAGNOSIS — R109 Unspecified abdominal pain: Secondary | ICD-10-CM | POA: Insufficient documentation

## 2022-04-30 DIAGNOSIS — Z7982 Long term (current) use of aspirin: Secondary | ICD-10-CM | POA: Diagnosis not present

## 2022-04-30 LAB — CBC WITH DIFFERENTIAL/PLATELET
Abs Immature Granulocytes: 0.04 10*3/uL (ref 0.00–0.07)
Basophils Absolute: 0.1 10*3/uL (ref 0.0–0.1)
Basophils Relative: 1 %
Eosinophils Absolute: 0.4 10*3/uL (ref 0.0–0.5)
Eosinophils Relative: 4 %
HCT: 32.1 % — ABNORMAL LOW (ref 39.0–52.0)
Hemoglobin: 10.5 g/dL — ABNORMAL LOW (ref 13.0–17.0)
Immature Granulocytes: 0 %
Lymphocytes Relative: 19 %
Lymphs Abs: 1.7 10*3/uL (ref 0.7–4.0)
MCH: 31 pg (ref 26.0–34.0)
MCHC: 32.7 g/dL (ref 30.0–36.0)
MCV: 94.7 fL (ref 80.0–100.0)
Monocytes Absolute: 0.8 10*3/uL (ref 0.1–1.0)
Monocytes Relative: 8 %
Neutro Abs: 6.4 10*3/uL (ref 1.7–7.7)
Neutrophils Relative %: 68 %
Platelets: 333 10*3/uL (ref 150–400)
RBC: 3.39 MIL/uL — ABNORMAL LOW (ref 4.22–5.81)
RDW: 13.6 % (ref 11.5–15.5)
WBC: 9.4 10*3/uL (ref 4.0–10.5)
nRBC: 0 % (ref 0.0–0.2)

## 2022-04-30 LAB — COMPREHENSIVE METABOLIC PANEL
ALT: 22 U/L (ref 0–44)
AST: 23 U/L (ref 15–41)
Albumin: 3.1 g/dL — ABNORMAL LOW (ref 3.5–5.0)
Alkaline Phosphatase: 236 U/L — ABNORMAL HIGH (ref 38–126)
Anion gap: 9 (ref 5–15)
BUN: 15 mg/dL (ref 8–23)
CO2: 25 mmol/L (ref 22–32)
Calcium: 9.2 mg/dL (ref 8.9–10.3)
Chloride: 103 mmol/L (ref 98–111)
Creatinine, Ser: 0.83 mg/dL (ref 0.61–1.24)
GFR, Estimated: >60 mL/min (ref >60)
Glucose, Bld: 139 mg/dL — ABNORMAL HIGH (ref 70–99)
Potassium: 3.9 mmol/L (ref 3.5–5.1)
Sodium: 137 mmol/L (ref 135–145)
Total Bilirubin: 0.6 mg/dL (ref 0.3–1.2)
Total Protein: 7.1 g/dL (ref 6.5–8.1)

## 2022-04-30 MED ORDER — SODIUM CHLORIDE 0.9 % IV BOLUS
1000.0000 mL | Freq: Once | INTRAVENOUS | Status: AC
  Administered 2022-04-30: 1000 mL via INTRAVENOUS

## 2022-04-30 NOTE — ED Provider Notes (Signed)
[facility] EMERGENCY DEPARTMENT Provider Note   CSN: [id number] Arrival date & time: 04/30/22  1804     History {Add pertinent medical, surgical, social history, OB history to HPI:1} Chief Complaint  Patient presents with   Urinary Retention    Bryan Vargas is a 71 y.o. male.  Patient took out his catheter today and was unable to urinate.  Patient stated he did have some blood after taking the catheter out.  Patient has a history of a kidney stone and nephrostomy tube that was removed a week ago   Abdominal Pain      Home Medications Prior to Admission medications   Medication Sig Start Date End Date Taking? Authorizing Provider  albuterol (VENTOLIN HFA) 108 (90 Base) MCG/ACT inhaler Inhale 1-2 puffs into the lungs every 6 (six) hours as needed for shortness of breath. 09/30/21   [provider]  aspirin EC 81 MG tablet Take 81 mg by mouth daily. Swallow whole.    [provider]  azelastine (ASTELIN) 0.1 % nasal spray Place 1 spray into both nostrils 2 (two) times daily as needed for rhinitis. 07/14/20   [provider]  cephALEXin (KEFLEX) 500 MG capsule Take 1 capsule (500 mg total) by mouth 3 (three) times daily. Patient not taking: Reported on 02/14/2022 02/02/22   Irine Seal, MD  finasteride (PROSCAR) 5 MG tablet Take 1 tablet (5 mg total) by mouth daily. 07/23/21   Irine Seal, MD  latanoprost (XALATAN) 0.005 % ophthalmic solution Place 1 drop into both eyes at bedtime. 07/03/20   [provider]  latanoprost (XALATAN) 0.005 % ophthalmic solution Apply to eye. 10/07/17   [provider]  Multiple Vitamins-Minerals (CENTRUM ADULTS PO) Take 1 tablet by mouth daily.    [provider]  pantoprazole (PROTONIX) 40 MG tablet TAKE 1 TABLET BY MOUTH TWICE A DAY Patient taking differently: Take 40 mg by mouth 2 (two) times daily. 09/03/21   Mansouraty, Telford Nab., MD  sulfamethoxazole-trimethoprim (BACTRIM DS) 800-160 MG tablet  Take 1 tablet by mouth 2 (two) times daily. Patient not taking: Reported on 03/20/2022 02/18/22   [provider]  tamsulosin (FLOMAX) 0.4 MG CAPS capsule Take 1 capsule (0.4 mg total) by mouth daily after supper. 07/23/21   Irine Seal, MD      Allergies    Patient has no known allergies.    Review of Systems   Review of Systems  Gastrointestinal:  Positive for abdominal pain.    Physical Exam Updated Vital Signs BP 102/75   Pulse 86   Temp 98.5 F (36.9 C) (Oral)   Resp 19   SpO2 96%  Physical Exam  ED Results / Procedures / Treatments   Labs (all labs ordered are listed, but only abnormal results are displayed) Labs Reviewed  CBC WITH DIFFERENTIAL/PLATELET - Abnormal; Notable for the following components:      Result Value   RBC 3.39 (*)    Hemoglobin 10.5 (*)    HCT 32.1 (*)    All other components within normal limits  COMPREHENSIVE METABOLIC PANEL  TYPE AND SCREEN    EKG None  Radiology CT ABDOMEN PELVIS WO CONTRAST  Result Date: 04/30/2022 CLINICAL DATA:  Recent left kidney stone removal, hematuria, bladder outlet obstruction EXAM: CT ABDOMEN AND PELVIS WITHOUT CONTRAST TECHNIQUE: Multidetector CT imaging of the abdomen and pelvis was performed following the standard protocol without IV contrast. RADIATION DOSE REDUCTION: This exam was performed according to the departmental dose-optimization program which includes automated  exposure control, adjustment of the mA and/or kV according to patient size and/or use of iterative reconstruction technique. COMPARISON:  03/19/2022 FINDINGS: Lower chest: Lung bases are clear. Hepatobiliary: Unenhanced liver is unremarkable. Gallbladder is decompressed and poorly evaluated. Subcentimeter gallstone in the gallbladder fundus (series 2/image 35). No intrahepatic or extrahepatic ductal dilatation. Indwelling common duct stent. Pancreas: Dominant 2.2 cm cyst in the pancreatic tail (series 2/image 27), previously 2.5 cm. Mild  parenchymal atrophy. Spleen: Within normal limits. Adrenals/Urinary Tract: Adrenal glands are within normal limits. Severe right renal atrophy with small cysts. Numerous left renal cysts measuring up to 6.3 cm (series 2/image 43), unchanged. Prior percutaneous nephrostomy catheter has been removed. Mild left hydroureteronephrosis. Distended, thick-walled bladder with trace nondependent gas. Stomach/Bowel: Stomach is grossly unremarkable. No evidence of bowel obstruction. Appendix is not discretely visualized. No colonic wall thickening or inflammatory changes. Vascular/Lymphatic: No evidence of abdominal aortic aneurysm. Atherosclerotic calcifications of the abdominal aorta and branch vessels. No suspicious abdominopelvic lymphadenopathy. Reproductive: Marked prostatomegaly, with enlargement of the central gland indenting the base of the bladder, reflecting BPH. Malpositioned Foley catheter with inflated balloon and adjacent gas in the proximal bulbar urethra (series 2/image 106). Other: No abdominopelvic ascites. Musculoskeletal: Degenerative changes of the visualized thoracolumbar spine. IMPRESSION: Malpositioned Foley catheter with inflated balloon in the proximal bulbar urethra. Balloon deflation and removal/replacement in the bladder is suggested. This may require urology assistance. Marked prostatomegaly, reflecting BPH. Distended, thick-walled bladder, likely reflecting chronic obstruction. Associated mild left hydroureteronephrosis. Prior percutaneous nephrostomy catheter has been removed. Additional ancillary findings as above. Electronically Signed   By: Julian Hy M.D.   On: 04/30/2022 22:23    Procedures Procedures  {Document cardiac monitor, telemetry assessment procedure when appropriate:1}  Medications Ordered in ED Medications  sodium chloride 0.9 % bolus 1,000 mL (1,000 mLs Intravenous New Bag/Given 04/30/22 2320)    ED Course/ Medical Decision Making/ A&P  Patient had a catheter  placed that was not draining.  CT scan shows it within the urethra.  It was removed.  The patient had significant bleeding and became hypotensive.  He has been given some fluids and labs have been checked.  I spoke with urology and they recommend putting in a coud catheter 18-gauge.  If he does not have good drainage he will be transferred to [place] Amount and/or Complexity of Data Reviewed Labs: ordered. Radiology: ordered. ECG/medicine tests: ordered.  Patient with urinary retention  {Document critical care time when appropriate:1} {Document review of labs and clinical decision tools ie heart score, Chads2Vasc2 etc:1}  {Document your independent review of radiology images, and any outside records:1} {Document your discussion with family members, caretakers, and with consultants:1} {Document social determinants of health affecting pt's care:1} {Document your decision making why or why not admission, treatments were needed:1} Final Clinical Impression(s) / ED Diagnoses Final diagnoses:  None    Rx / DC Orders ED Discharge Orders     None

## 2022-04-30 NOTE — ED Provider Notes (Signed)
  Provider Note MRN:  [id number]  Arrival date & time: 05/01/22    ED Course and Medical Decision Making  Assumed care from Dr. Roderic Palau at shift change.  Recent kidney stone, nephrostomy tube at [facility], recent urinary retention, Foley catheter placed earlier today unfortunately in the urethra.  Removed and now with some gross hematuria, brief episode of hypotension.  Providing fluids, nursing to attempt coud catheter.  If unable to place catheter will need to reach out to the resident and likely transfer to [place] [facility].  3 AM update: Successful placement of coud catheter into the bladder.  Large clot irrigated and now free flow of urine.  Over a liter of output.  Patient feels much better and is resting comfortably, normal vital signs for a few hours now, labs overall reassuring.  He is appropriate for close urology follow-up as an outpatient.  Procedures  Final Clinical Impressions(s) / ED Diagnoses     ICD-10-CM   1. Urinary retention  R33.9     2. Hematuria, unspecified type  R31.9       ED Discharge Orders     None         Discharge Instructions      You were evaluated in the Emergency Department and after careful evaluation, we did not find any emergent condition requiring admission or further testing in the hospital.  Your exam/testing today was overall reassuring.  Recommend close follow-up with urology within the next few days.  Please return to the Emergency Department if you experience any worsening of your condition.  Thank you for allowing [place] to be a part of your care.       Barth Kirks. [place] Small, [place] [email or web address]    Maudie Flakes, MD 05/01/22 [phone]

## 2022-04-30 NOTE — ED Triage Notes (Addendum)
Wife states pt took his own foley catheter out himself today, states pt has done in the past before.  Urine was clear and when it was removed, had a blood clot noted in the bag.  Has not urinated since removing foley at 1200 today. Will f/u with urologist in [place] .

## 2022-04-30 NOTE — ED Notes (Signed)
Spoke with DR Roderic Palau regarding bladder scan results - will obtain CT prior to attempting larger size catheter insertion.

## 2022-04-30 NOTE — ED Notes (Signed)
Bladder scan pt and read 999ML

## 2022-04-30 NOTE — ED Notes (Signed)
600 cc bloody urine noted in foley bag at this time. Dr Roderic Palau at bedside.

## 2022-04-30 NOTE — ED Notes (Signed)
Patient transported to CT 

## 2022-04-30 NOTE — ED Notes (Signed)
Catheter irrigated with 180 cc NS - no immediate return noted, nurse pulled back on syringe in attempt to bust blood clot, this was repeated several times. Call placed to [facility] for urology cart in case larger cath is needed for pt. Dr Roderic Palau made aware.

## 2022-04-30 NOTE — ED Notes (Signed)
Spoke with Gwyndolyn Saxon in [place]- requested he get this pt for CT next, as this scan is quick and needs to be resulted prior to another intervention for urinary retention. Gwyndolyn Saxon says he will get this pt next.

## 2022-04-30 NOTE — ED Notes (Signed)
Pt given urinal as he reports feeling the urge to urinate.

## 2022-04-30 NOTE — ED Notes (Signed)
Attempted 16g cath on Pt and no urine return, called nurse to bedside to irrigate cath.

## 2022-05-01 LAB — TYPE AND SCREEN
ABO/RH(D): A NEG
Antibody Screen: NEGATIVE

## 2022-05-01 NOTE — ED Notes (Signed)
Pt says pressure to bladder and abd has resolved

## 2022-05-01 NOTE — Discharge Instructions (Signed)
You were evaluated in the Emergency Department and after careful evaluation, we did not find any emergent condition requiring admission or further testing in the hospital.  Your exam/testing today was overall reassuring.  Recommend close follow-up with urology within the next few days.  Please return to the Emergency Department if you experience any worsening of your condition.  Thank you for allowing [place] to be a part of your care.

## 2022-05-03 ENCOUNTER — Ambulatory Visit: Payer: Medicare Other | Admitting: Physician Assistant

## 2022-05-10 ENCOUNTER — Encounter (HOSPITAL_COMMUNITY): Payer: Self-pay

## 2022-05-10 ENCOUNTER — Ambulatory Visit (HOSPITAL_COMMUNITY): Admit: 2022-05-10 | Payer: Medicare Other | Admitting: Gastroenterology

## 2022-05-10 SURGERY — COLONOSCOPY WITH PROPOFOL
Anesthesia: Monitor Anesthesia Care

## 2022-06-04 ENCOUNTER — Other Ambulatory Visit: Payer: Self-pay | Admitting: Gastroenterology

## 2022-11-22 ENCOUNTER — Other Ambulatory Visit (HOSPITAL_COMMUNITY): Payer: Self-pay

## 2022-12-19 IMAGING — CT CT ABDOMEN W/ CM
3 of 5 series · 16 of 46 positions shown, 18 images · IV contrast (APPLIED)
Comparison: 02/25/2021

CLINICAL DATA: Recurrent duodenal stricture, recent esophageal
dilatation

EXAM:
CT ABDOMEN WITH CONTRAST
TECHNIQUE: Multidetector CT imaging of the abdomen was performed using the
standard protocol following bolus administration of intravenous
contrast.
CONTRAST:  80mL OMNIPAQUE IOHEXOL 350 MG/ML SOLN

[Series 2: axial st · axial · 0.89mm/px · z∈[-412,-112]mm · 11 of 74 slices shown, 13 images]
[im 7/74  soft-tissue]
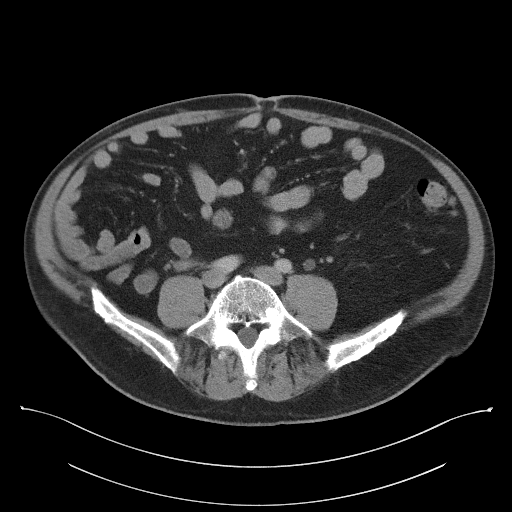
[im 7/74  bone]
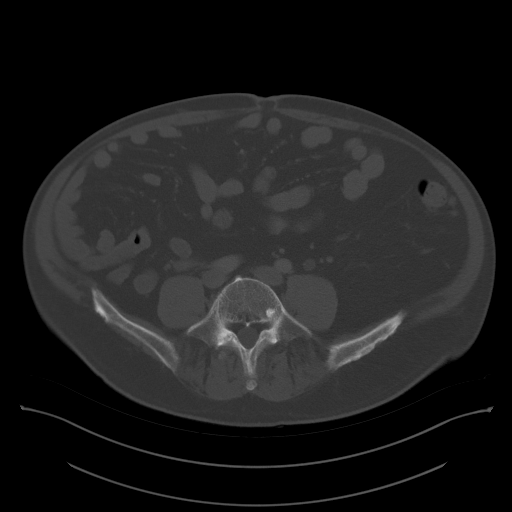
[im 13/74  soft-tissue]
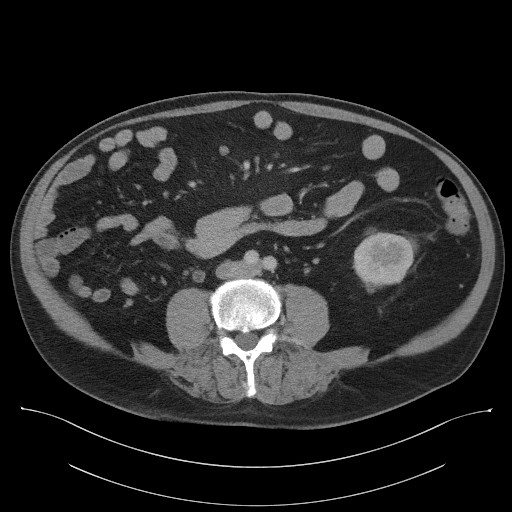
[im 19/74  soft-tissue]
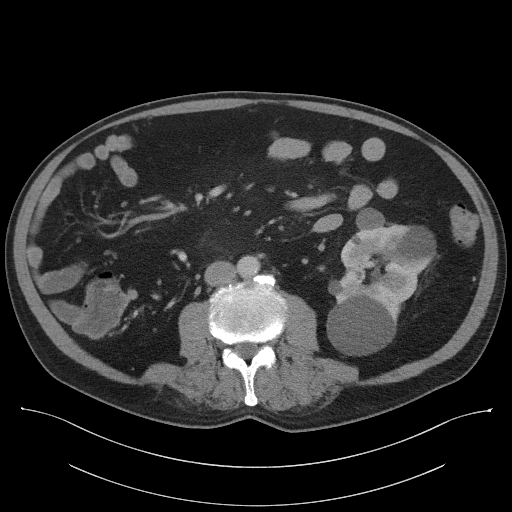
[im 25/74  soft-tissue]
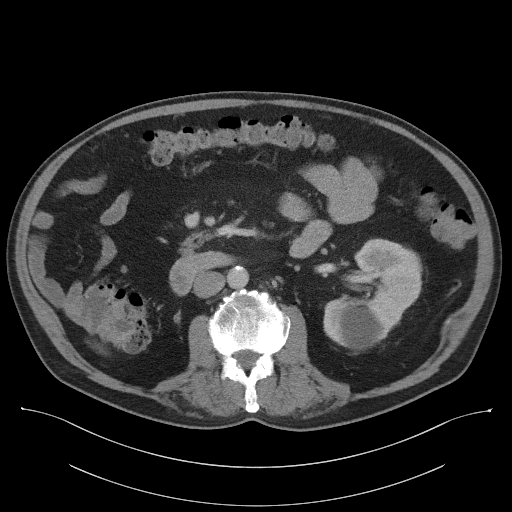
[im 31/74  soft-tissue]
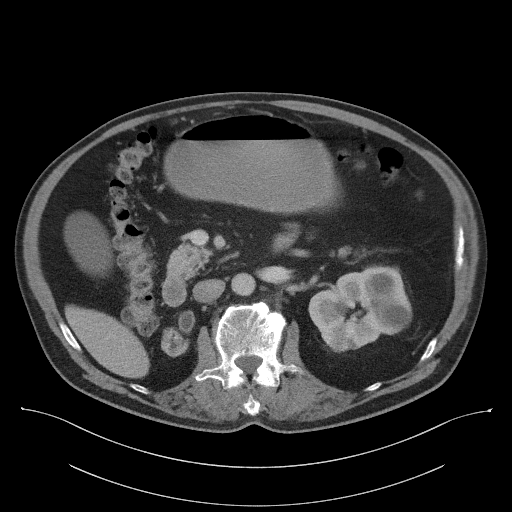
[im 37/74  soft-tissue]
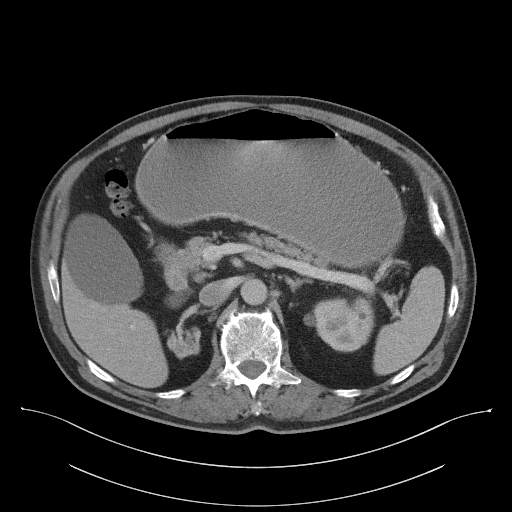
[im 43/74  soft-tissue]
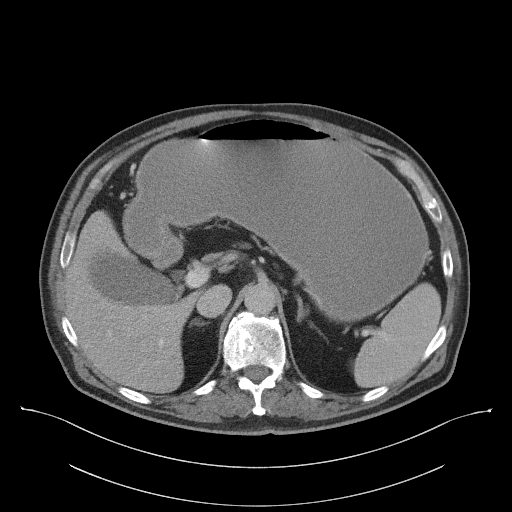
[im 49/74  soft-tissue]
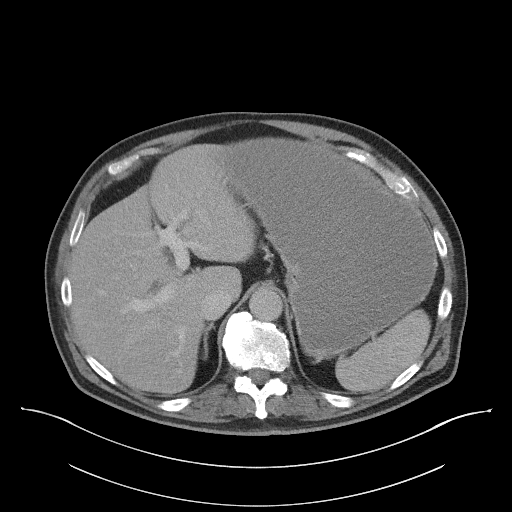
[im 55/74  soft-tissue]
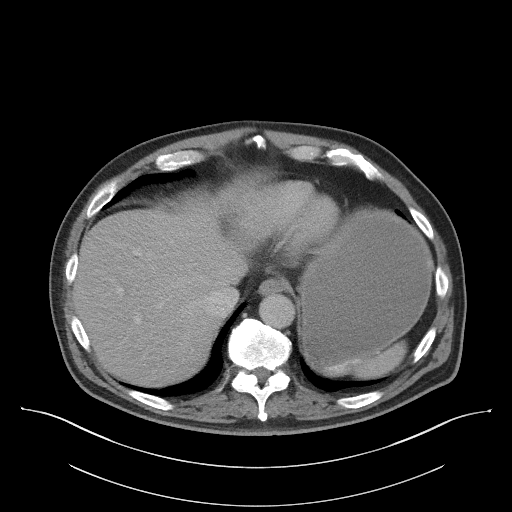
[im 55/74  bone]
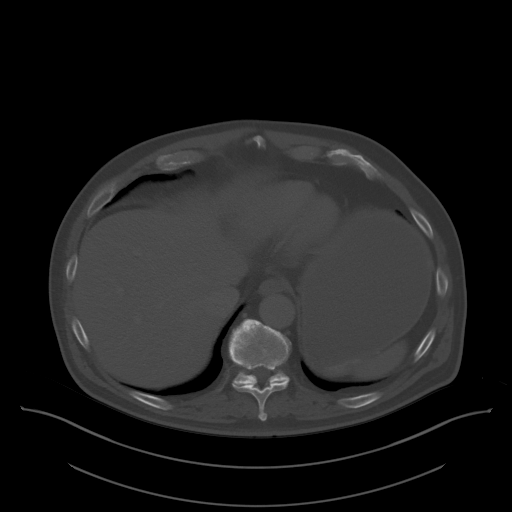
[im 61/74  soft-tissue]
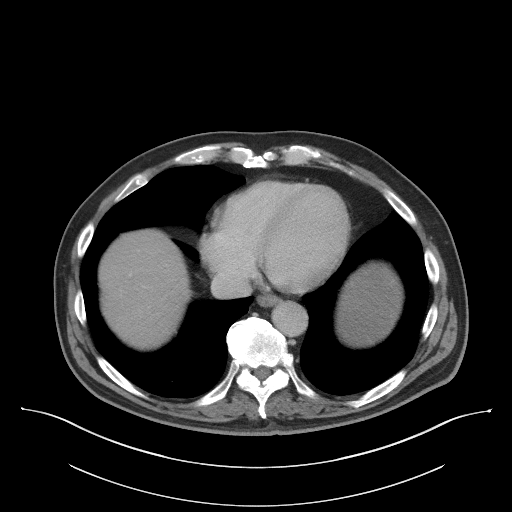
[im 67/74  soft-tissue]
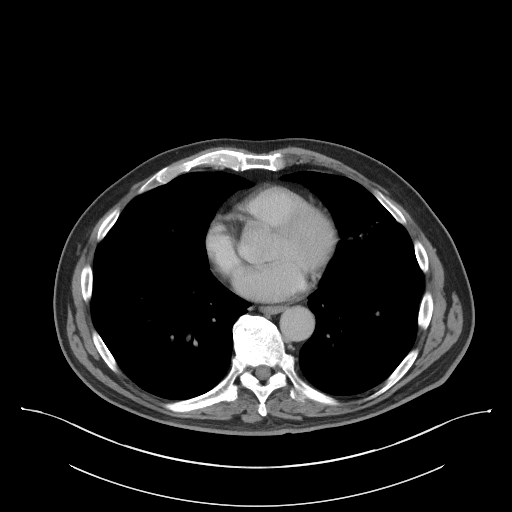

[Series 4: lung bases · axial · 0.76mm/px · z∈[-209,-195]mm · 2 of 73 slices shown]
[im 7/73  bone]
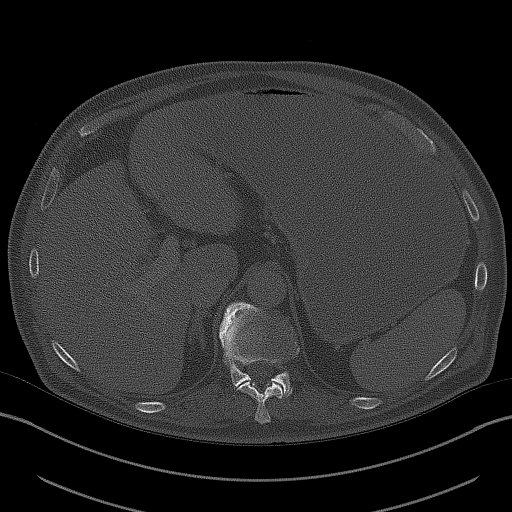
[im 14/73  bone]
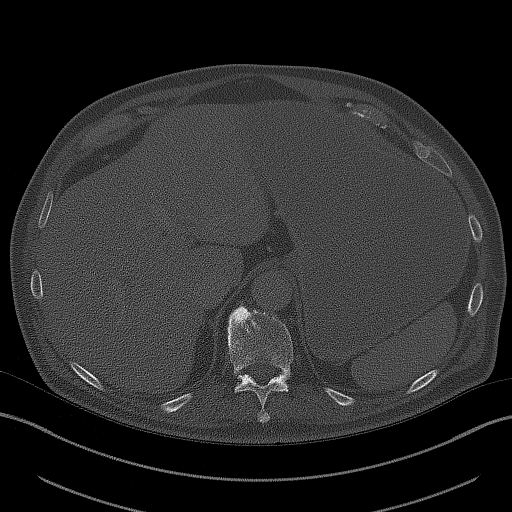

[Series 5: coronal st · coronal · 0.75mm/px · 3 of 104 slices shown]
[im 35/104  soft-tissue]
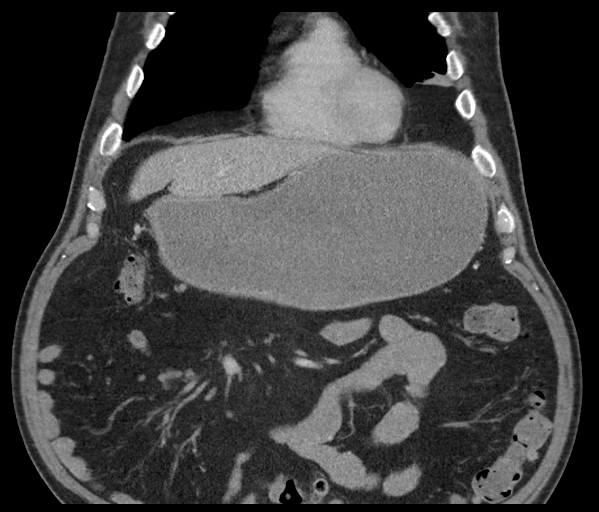
[im 46/104  soft-tissue]
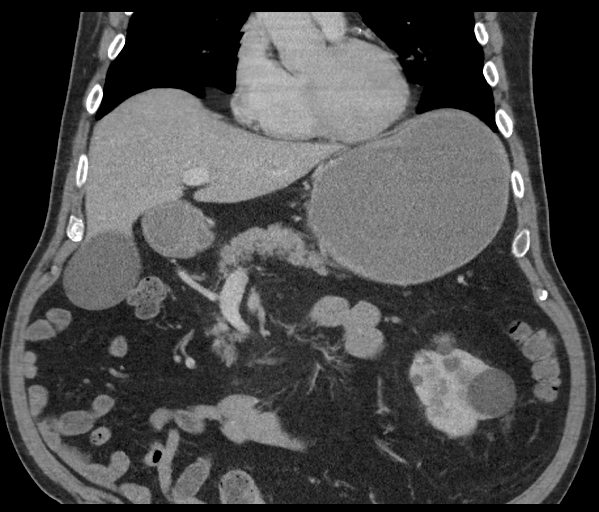
[im 58/104  soft-tissue]
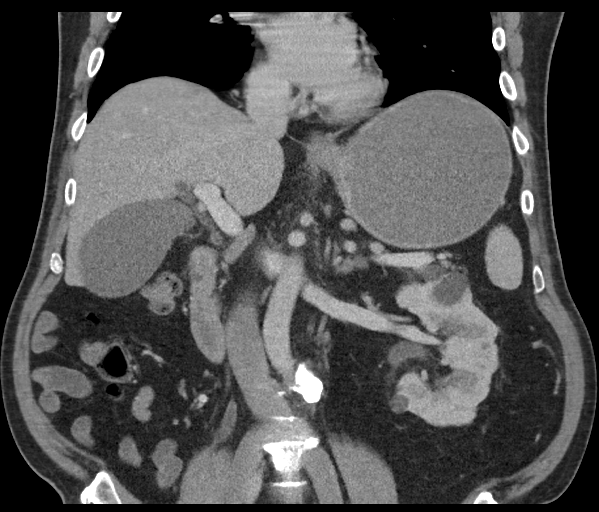

[16 of 46 positions shown; findings below may reference images not displayed]

FINDINGS: Lower chest: No acute abnormality.

Hepatobiliary: No focal liver abnormality is seen. Hepatic
steatosis. No gallstones, gallbladder wall thickening, or biliary
dilatation.

Pancreas: 2.0 cm fluid attenuation lesion of the tip of the
pancreatic tail and adjacent 1.4 x 1.1 cm lesion just proximally
(series 2, image 40, 41). These are slightly enlarged in comparison
to examination dated 09/30/2020, not significantly changed compared
to more recent CT. No pancreatic ductal dilatation or surrounding
inflammatory changes.

Spleen: Normal in size without focal abnormality.

Adrenals/Urinary Tract: Adrenal glands are unremarkable. Markedly
atrophic right kidney. Numerous left renal cysts. No left-sided
hydronephrosis. Bladder is unremarkable.

Stomach/Bowel: Markedly fluid distended stomach. There is abrupt
narrowing of the descending portion of the duodenum (series 2, image
39). Appendix appears normal. No evidence of bowel wall thickening,
distention, or inflammatory changes.

Vascular/Lymphatic: Aortic atherosclerosis. No enlarged abdominal
lymph nodes.

Other: No abdominal wall hernia or abnormality. No abdominopelvic
ascites.

Musculoskeletal: No acute or significant osseous findings.
IMPRESSION: 1. Markedly fluid distended stomach. There is abrupt narrowing of
the descending portion of the duodenum. Findings are generally in
keeping with reported duodenal stricture. There is no extrinsic
abnormality such as compressing mass evident.
2. There are two fluid attenuation lesions of the tip of the
pancreatic tail measuring up to 2.0 cm. These are slightly enlarged
in comparison to examination dated 09/30/2020, not significantly
changed compared to more recent CT. These most likely reflect small
pancreatic pseudocysts given short interval enlargement,
differential consideration side branch IPMNs. Recommend multiphasic
contrast enhanced MRI in 6 months to ensure stability. EUS/FNA can
alternately be considered at this time.
3. Markedly atrophic right kidney, in keeping with prior
obstructive, infectious, or ischemic insult.
4. Hepatic steatosis.

Aortic Atherosclerosis ([id number]).

## 2023-04-11 ENCOUNTER — Other Ambulatory Visit: Payer: Self-pay | Admitting: Gastroenterology

## 2023-07-24 DEATH — deceased

## 2024-05-25 ENCOUNTER — Other Ambulatory Visit (HOSPITAL_COMMUNITY): Payer: Self-pay
